# Patient Record
Sex: Male | Born: 1935 | Race: White | Hispanic: No | Smoking: Former smoker
Health system: Southern US, Community
[De-identification: ages and names within clinical notes are randomized; demographics above are authoritative.]

## PROBLEM LIST (undated history)

## (undated) DIAGNOSIS — I519 Heart disease, unspecified: Secondary | ICD-10-CM

## (undated) DIAGNOSIS — I4819 Other persistent atrial fibrillation: Secondary | ICD-10-CM

## (undated) DIAGNOSIS — E119 Type 2 diabetes mellitus without complications: Secondary | ICD-10-CM

## (undated) DIAGNOSIS — E785 Hyperlipidemia, unspecified: Secondary | ICD-10-CM

## (undated) DIAGNOSIS — I10 Essential (primary) hypertension: Secondary | ICD-10-CM

## (undated) HISTORY — PX: RECTAL SURGERY: SHX760

## (undated) HISTORY — DX: Hyperlipidemia, unspecified: E78.5

## (undated) HISTORY — DX: Essential (primary) hypertension: I10

## (undated) HISTORY — PX: HERNIA REPAIR: SHX51

## (undated) HISTORY — PX: OTHER SURGICAL HISTORY: SHX169

## (undated) HISTORY — PX: KNEE SURGERY: SHX244

## (undated) HISTORY — DX: Type 2 diabetes mellitus without complications: E11.9

---

## 1998-12-11 ENCOUNTER — Encounter: Admission: RE | Admit: 1998-12-11 | Discharge: 1999-03-11 | Payer: Self-pay | Admitting: Family Medicine

## 2000-09-23 ENCOUNTER — Encounter: Payer: Self-pay | Admitting: Urology

## 2000-09-25 ENCOUNTER — Ambulatory Visit (HOSPITAL_COMMUNITY): Admission: RE | Admit: 2000-09-25 | Discharge: 2000-09-25 | Payer: Self-pay | Admitting: Surgery

## 2002-01-06 ENCOUNTER — Ambulatory Visit (HOSPITAL_COMMUNITY): Admission: RE | Admit: 2002-01-06 | Discharge: 2002-01-06 | Payer: Self-pay | Admitting: Surgery

## 2002-01-06 ENCOUNTER — Encounter: Payer: Self-pay | Admitting: Surgery

## 2004-02-02 ENCOUNTER — Encounter (INDEPENDENT_AMBULATORY_CARE_PROVIDER_SITE_OTHER): Payer: Self-pay | Admitting: *Deleted

## 2004-02-02 ENCOUNTER — Ambulatory Visit (HOSPITAL_COMMUNITY): Admission: RE | Admit: 2004-02-02 | Discharge: 2004-02-02 | Payer: Self-pay | Admitting: Gastroenterology

## 2005-02-21 ENCOUNTER — Ambulatory Visit: Payer: Self-pay | Admitting: Internal Medicine

## 2005-02-22 ENCOUNTER — Ambulatory Visit: Payer: Self-pay | Admitting: Internal Medicine

## 2005-08-08 ENCOUNTER — Ambulatory Visit: Payer: Self-pay | Admitting: Internal Medicine

## 2005-11-29 ENCOUNTER — Ambulatory Visit: Payer: Self-pay | Admitting: Internal Medicine

## 2006-02-10 ENCOUNTER — Ambulatory Visit: Payer: Self-pay | Admitting: Internal Medicine

## 2006-05-01 ENCOUNTER — Ambulatory Visit: Payer: Self-pay | Admitting: Internal Medicine

## 2006-09-01 ENCOUNTER — Ambulatory Visit: Payer: Self-pay | Admitting: Internal Medicine

## 2007-02-02 ENCOUNTER — Ambulatory Visit: Payer: Self-pay | Admitting: Internal Medicine

## 2007-02-09 ENCOUNTER — Ambulatory Visit: Payer: Self-pay | Admitting: Internal Medicine

## 2007-03-12 ENCOUNTER — Ambulatory Visit: Payer: Self-pay | Admitting: Internal Medicine

## 2007-06-10 ENCOUNTER — Ambulatory Visit: Payer: Self-pay | Admitting: Internal Medicine

## 2007-09-10 ENCOUNTER — Telehealth: Payer: Self-pay | Admitting: Internal Medicine

## 2007-09-29 ENCOUNTER — Ambulatory Visit: Payer: Self-pay | Admitting: Internal Medicine

## 2007-10-29 ENCOUNTER — Telehealth (INDEPENDENT_AMBULATORY_CARE_PROVIDER_SITE_OTHER): Payer: Self-pay | Admitting: *Deleted

## 2007-11-03 DIAGNOSIS — E119 Type 2 diabetes mellitus without complications: Secondary | ICD-10-CM | POA: Insufficient documentation

## 2007-11-03 DIAGNOSIS — J302 Other seasonal allergic rhinitis: Secondary | ICD-10-CM

## 2007-11-03 DIAGNOSIS — I10 Essential (primary) hypertension: Secondary | ICD-10-CM | POA: Insufficient documentation

## 2007-11-03 DIAGNOSIS — J4520 Mild intermittent asthma, uncomplicated: Secondary | ICD-10-CM | POA: Insufficient documentation

## 2007-11-03 DIAGNOSIS — J3089 Other allergic rhinitis: Secondary | ICD-10-CM

## 2007-11-03 DIAGNOSIS — E7800 Pure hypercholesterolemia, unspecified: Secondary | ICD-10-CM | POA: Insufficient documentation

## 2007-12-03 ENCOUNTER — Ambulatory Visit: Payer: Self-pay | Admitting: Internal Medicine

## 2008-01-05 ENCOUNTER — Telehealth (INDEPENDENT_AMBULATORY_CARE_PROVIDER_SITE_OTHER): Payer: Self-pay | Admitting: *Deleted

## 2008-02-03 ENCOUNTER — Telehealth (INDEPENDENT_AMBULATORY_CARE_PROVIDER_SITE_OTHER): Payer: Self-pay | Admitting: *Deleted

## 2008-02-09 ENCOUNTER — Ambulatory Visit: Payer: Self-pay | Admitting: Internal Medicine

## 2008-02-12 ENCOUNTER — Encounter: Payer: Self-pay | Admitting: Internal Medicine

## 2008-03-07 ENCOUNTER — Encounter: Payer: Self-pay | Admitting: Internal Medicine

## 2008-03-15 ENCOUNTER — Telehealth (INDEPENDENT_AMBULATORY_CARE_PROVIDER_SITE_OTHER): Payer: Self-pay | Admitting: *Deleted

## 2008-07-08 ENCOUNTER — Ambulatory Visit: Payer: Self-pay | Admitting: Internal Medicine

## 2008-11-03 ENCOUNTER — Ambulatory Visit: Payer: Self-pay | Admitting: Internal Medicine

## 2009-02-16 ENCOUNTER — Ambulatory Visit: Payer: Self-pay | Admitting: Internal Medicine

## 2009-02-16 DIAGNOSIS — E785 Hyperlipidemia, unspecified: Secondary | ICD-10-CM | POA: Insufficient documentation

## 2009-02-21 ENCOUNTER — Ambulatory Visit: Payer: Self-pay | Admitting: Internal Medicine

## 2009-05-30 ENCOUNTER — Encounter: Admission: RE | Admit: 2009-05-30 | Discharge: 2009-05-30 | Payer: Self-pay | Admitting: Family Medicine

## 2009-06-20 ENCOUNTER — Ambulatory Visit: Payer: Self-pay | Admitting: Internal Medicine

## 2009-10-24 ENCOUNTER — Telehealth: Payer: Self-pay | Admitting: Internal Medicine

## 2009-10-26 ENCOUNTER — Ambulatory Visit: Payer: Self-pay | Admitting: Internal Medicine

## 2010-02-16 ENCOUNTER — Ambulatory Visit: Payer: Self-pay | Admitting: Internal Medicine

## 2010-02-19 ENCOUNTER — Ambulatory Visit: Payer: Self-pay | Admitting: Internal Medicine

## 2010-03-13 ENCOUNTER — Telehealth: Payer: Self-pay | Admitting: Internal Medicine

## 2010-06-26 ENCOUNTER — Ambulatory Visit: Payer: Self-pay | Admitting: Internal Medicine

## 2010-08-22 ENCOUNTER — Telehealth (INDEPENDENT_AMBULATORY_CARE_PROVIDER_SITE_OTHER): Payer: Self-pay | Admitting: *Deleted

## 2010-10-16 ENCOUNTER — Inpatient Hospital Stay (HOSPITAL_COMMUNITY)
Admission: RE | Admit: 2010-10-16 | Discharge: 2010-10-18 | Payer: Self-pay | Source: Home / Self Care | Attending: Orthopedic Surgery | Admitting: Orthopedic Surgery

## 2010-10-22 LAB — CBC
HCT: 32.6 % — ABNORMAL LOW (ref 39.0–52.0)
HCT: 31.2 % — ABNORMAL LOW (ref 39.0–52.0)
Hemoglobin: 10.8 g/dL — ABNORMAL LOW (ref 13.0–17.0)
Hemoglobin: 10.4 g/dL — ABNORMAL LOW (ref 13.0–17.0)
MCH: 32.4 pg (ref 26.0–34.0)
MCH: 32.3 pg (ref 26.0–34.0)
MCHC: 33.1 g/dL (ref 30.0–36.0)
MCHC: 33.3 g/dL (ref 30.0–36.0)
MCV: 97.9 fL (ref 78.0–100.0)
MCV: 96.9 fL (ref 78.0–100.0)
Platelets: 134 10*3/uL — ABNORMAL LOW (ref 150–400)
Platelets: 128 10*3/uL — ABNORMAL LOW (ref 150–400)
RBC: 3.33 MIL/uL — ABNORMAL LOW (ref 4.22–5.81)
RBC: 3.22 MIL/uL — ABNORMAL LOW (ref 4.22–5.81)
RDW: 13.3 % (ref 11.5–15.5)
RDW: 13.1 % (ref 11.5–15.5)
WBC: 6.7 10*3/uL (ref 4.0–10.5)
WBC: 9 10*3/uL (ref 4.0–10.5)

## 2010-10-22 LAB — GLUCOSE, CAPILLARY
Glucose-Capillary: 124 mg/dL — ABNORMAL HIGH (ref 70–99)
Glucose-Capillary: 112 mg/dL — ABNORMAL HIGH (ref 70–99)
Glucose-Capillary: 153 mg/dL — ABNORMAL HIGH (ref 70–99)
Glucose-Capillary: 173 mg/dL — ABNORMAL HIGH (ref 70–99)

## 2010-10-22 LAB — BASIC METABOLIC PANEL
BUN: 14 mg/dL (ref 6–23)
BUN: 12 mg/dL (ref 6–23)
CO2: 30 mEq/L (ref 19–32)
CO2: 29 mEq/L (ref 19–32)
Calcium: 8.7 mg/dL (ref 8.4–10.5)
Calcium: 8.8 mg/dL (ref 8.4–10.5)
Chloride: 101 mEq/L (ref 96–112)
Chloride: 101 mEq/L (ref 96–112)
Creatinine, Ser: 1.17 mg/dL (ref 0.4–1.5)
Creatinine, Ser: 1.18 mg/dL (ref 0.4–1.5)
GFR calc Af Amer: >60 mL/min (ref >60)
GFR calc Af Amer: >60 mL/min (ref >60)
GFR calc non Af Amer: >60 mL/min (ref >60)
GFR calc non Af Amer: >60 mL/min (ref >60)
Glucose, Bld: 146 mg/dL — ABNORMAL HIGH (ref 70–99)
Glucose, Bld: 136 mg/dL — ABNORMAL HIGH (ref 70–99)
Potassium: 4.1 mEq/L (ref 3.5–5.1)
Potassium: 4.2 mEq/L (ref 3.5–5.1)
Sodium: 137 mEq/L (ref 135–145)
Sodium: 137 mEq/L (ref 135–145)

## 2010-10-22 LAB — TYPE AND SCREEN
ABO/RH(D): A POS
Antibody Screen: NEGATIVE

## 2010-10-22 LAB — ABO/RH: ABO/RH(D): A POS

## 2010-11-06 NOTE — Progress Notes (Signed)
Summary: [facility] Physcial Forms  [facility] Physcial Forms   Imported By: Sherian Rein 02/22/2010 14:37:59  _____________________________________________________________________  External Attachment:    Type:   Image     Comment:   External Document

## 2010-11-06 NOTE — Assessment & Plan Note (Signed)
Summary: 12 months/apc   Primary Provider/Referring Provider:  Jeannetta Nap  CC:  Yearly follow up visit-asthma and allergies..  History of Present Illness: 08/01/08- He returns for one year follow-up, needing me to fill out his [facility] physical form so he can continue getting his allergy vaccine. He gives his own at 1:10 with no problems, states that it seems to help and he doesn't want to make changes. We discussed vaccine safety nd goals. and renewed Epipen. He denies significant nasal congestion, wheeze or cough.  Mar 07, 2009 Allergic rhinitis.  Needs VA renewal approval to continue allergy vaccine. Denies acute problems or changes in past year . He wants to continue allergy vaccine and asks standby meds be refilled. We reviewed risk benefit considerations with allergy vaccine, and the VA documentation needs.  March 07, 2010- Allergic rhinitis He comes for annual re-evaluation of his allergy vaccine. He is sure it helps, and doesn't want to stop to prove it.. This has been a good year. He brings needed form for [facility]. Says general health is good except for old injury right knee which will eventually need surgery. Denies cough, wheeze, chest pain, palpitation. He uses occasional bursts of fexofenadine180. Uses rescue inhaler rarely now. Denies hives, throat swelling or dry cough attributable to Lisinopril. Dr Jeannetta Nap continues to follow for primary care.     Current Medications (verified): 1)  Lisinopril 10 Mg  Tabs (Lisinopril) .... Take 1 Tablet By Mouth Once A Day 2)  Lovastatin 40 Mg Tabs (Lovastatin) .... Take 1 By Mouth Once Daily 3)  Bayer Low Strength 81 Mg  Tbec (Aspirin) .... Take 1 Tablet By Mouth Once A Day 4)  Proair Hfa 108 (90 Base) Mcg/act  Aers (Albuterol Sulfate) .... Inhale 2 Puffs Every 4 To 6 Hours As Needed 5)  Allergy Vaccine  Go 1:10 6)  Fexofenadine Hcl 180 Mg Tabs (Fexofenadine Hcl) .... Take 1 Tablet By Mouth Once A Day 7)  Epipen 0.3 Mg/0.83ml (1:1000)  Devi (Epinephrine Hcl  (Anaphylaxis)) .... For Severe Allergic Reaction As Needed  Allergies (verified): No Known Drug Allergies  Past History:  Past Medical History: Last updated: March 07, 2009 Allergic Rhinitis Asthma Diabetes, Type 2- diet Hyperlipidemia Hypertension  Past Surgical History: Last updated: 2009-03-07 hernia repair x 2 rectal surgery bilateral knee surgeries shoulder bone spur/ tendon repair.  Family History: Last updated: 03-07-2009 Mother- died ? lung cancer was smoker Father- died old age  Social History: Last updated: March 07, 2010 Patient states former smoker.  widowed Worked for [employer]  Risk Factors: Smoking Status: quit (07-Mar-2009)  Social History: Patient states former smoker.  widowed Worked for [employer]  Review of Systems      See HPI  The patient denies shortness of breath with activity, shortness of breath at rest, productive cough, non-productive cough, coughing up blood, chest pain, irregular heartbeats, acid heartburn, indigestion, loss of appetite, weight change, abdominal pain, difficulty swallowing, sore throat, tooth/dental problems, headaches, nasal congestion/difficulty breathing through nose, and sneezing.    Vital Signs:  Patient profile:   75 year old male Height:      70 inches Weight:      193 pounds BMI:     27.79 O2 Sat:      96 % on Room air Pulse rate:   46 / minute BP sitting:   132 / 80  (right arm) Cuff size:   regular  Vitals Entered By: Reynaldo Minium CMA (03-07-2010 9:09 AM)  O2 Flow:  Room air  Physical Exam  Additional Exam:  General: A/Ox3; pleasant and cooperative, NAD, trim SKIN: no rash, lesions NODES: no lymphadenopathy HEENT: [place]/AT, EOM- WNL, Conjuctivae- clear, PERRLA, TM-WNL, Nose- clear, mucosa is pale but with little mucus or edema, Throat- clear and wnl, Mallampati II, dentures NECK: Supple w/ fair ROM, JVD- none, normal carotid impulses w/o bruits  CHEST: Clear to  P&A HEART: RRR, no m/g/r heard ABDOMEN: EAV:WUJW, nl pulses, no edema  NEURO: Grossly intact to observation      Impression & Recommendations:  Problem # 1:  ALLERGIC RHINITIS (ICD-477.9)  He wishes to continue allergy vaccine, not wanting to return to his original symptoms and not wanting to face a restart and rebuild if he should come off and fail to do well with heavier use of pharmaceuticals than he now requires. He tolerates vaccine well.  We discussed options and will refill his Epipen. His updated medication list for this problem includes:    Fexofenadine Hcl 180 Mg Tabs (Fexofenadine hcl) .Marland Kitchen... Take 1 tablet by mouth once a day  Problem # 2:  ASTHMA (ICD-493.90) Well controlled with appropriate use of his rescue inhaler very occasionally. He is careful about exposures.  Medications Added to Medication List This Visit: 1)  Lovastatin 40 Mg Tabs (Lovastatin) .... Take 1 by mouth once daily  Other Orders: Est. Patient Level III (11914)  Patient Instructions: 1)  Please schedule a follow-up appointment in 1 year. 2)  Refill scripts printed 3)  I will send VA form as requested 4)  Continue allergy vaccine- contact the allergy lab as needed. Prescriptions: EPIPEN 0.3 MG/0.3ML (1:1000)  DEVI (EPINEPHRINE HCL (ANAPHYLAXIS)) For severe allergic reaction as needed  #1 x prn   Entered and Authorized by:   Waymon Budge MD   Signed by:   Waymon Budge MD on 02/16/2010   Method used:   Print then Give to Patient   RxID:   [id number] FEXOFENADINE HCL 180 MG TABS (FEXOFENADINE HCL) Take 1 tablet by mouth once a day  #90 x 3   Entered and Authorized by:   Waymon Budge MD   Signed by:   Waymon Budge MD on 02/16/2010   Method used:   Print then Give to Patient   RxID:   [id number] PROAIR HFA 108 (90 BASE) MCG/ACT  AERS (ALBUTEROL SULFATE) inhale 2 puffs every 4 to 6 hours as needed  #1 x prn   Entered and Authorized by:   Waymon Budge MD   Signed by:    Waymon Budge MD on 02/16/2010   Method used:   Print then Give to Patient   RxID:   [id number]

## 2010-11-06 NOTE — Progress Notes (Signed)
  Phone Note Other Incoming   Request: Send information Summary of Call: Records request received from Department of [facility], request forwarded to [employer].

## 2010-11-06 NOTE — Progress Notes (Signed)
Summary: change from fexofenadine to claritin due to ins coverage  Phone Note Outgoing Call Call back at [facility] Phone [phone]   Call placed by: Philipp Deputy CMA,  March 13, 2010 4:24 PM Call placed to: Patient Action Taken: Phone Call Completed Summary of Call: called pt because [facility]pt [facility] veterans [facility] will not cover the fexofenadine that was sent, pt request that we send new rx for claritin because they will cover this--[facility] print [facility] [place] [facility] fax # [phone] Initial call taken by: Philipp Deputy CMA,  March 13, 2010 4:26 PM  Follow-up for Phone Call        done new rx signed by dr young and faxed to requested fax # above Follow-up by: Philipp Deputy CMA,  March 13, 2010 4:29 PM    New/Updated Medications: CLARITIN 10 MG TABS (LORATADINE) 1 by mouth once daily Prescriptions: CLARITIN 10 MG TABS (LORATADINE) 1 by mouth once daily  #90 x 3   Entered by:   Philipp Deputy CMA   Authorized by:   Waymon Budge MD   Signed by:   Philipp Deputy CMA on 03/13/2010   Method used:   Printed then faxed to ...       [facility]* (retail)       [address] [place], [place]  [address]       Ph: [phone] or [phone]       Fax: [phone]   RxID:   [id number]

## 2010-11-06 NOTE — Progress Notes (Signed)
Summary: prescript  Phone Note Call from Patient   Caller: Patient Call For: Rashaunda Rahl Summary of Call: need fexofenadine 180mg  [facility] Initial call taken by: Rickard Patience,  October 24, 2009 10:16 AM  Follow-up for Phone Call        Pt states he has changed insurance to [facility] and they will cover fexofenadine so he wants to switch back from claritin to fexofenadine because he states it works better for him. Please advise if okay to switch. Carron Curie CMA  October 24, 2009 10:43 AM   Additional Follow-up for Phone Call Additional follow up Details #1::        Ok to give. Reynaldo Minium CMA  October 24, 2009 11:20 AM   rx sent. Carron Curie CMA  October 24, 2009 12:03 PM     New/Updated Medications: FEXOFENADINE HCL 180 MG TABS (FEXOFENADINE HCL) Take 1 tablet by mouth once a day Prescriptions: FEXOFENADINE HCL 180 MG TABS (FEXOFENADINE HCL) Take 1 tablet by mouth once a day  #90 x 3   Entered by:   Carron Curie CMA   Authorized by:   Waymon Budge MD   Signed by:   Carron Curie CMA on 10/24/2009   Method used:   Faxed to ...       [facility] (mail-order)       [address]       [place], [place]  [id number]       Ph: [phone]       Fax: [phone]   RxID:   [id number]

## 2010-11-08 DIAGNOSIS — J301 Allergic rhinitis due to pollen: Secondary | ICD-10-CM

## 2010-12-17 LAB — BASIC METABOLIC PANEL
BUN: 14 mg/dL (ref 6–23)
CO2: 30 mEq/L (ref 19–32)
Calcium: 9.5 mg/dL (ref 8.4–10.5)
Chloride: 99 mEq/L (ref 96–112)
Creatinine, Ser: 1.02 mg/dL (ref 0.4–1.5)
GFR calc Af Amer: >60 mL/min (ref >60)
GFR calc non Af Amer: >60 mL/min (ref >60)
Glucose, Bld: 90 mg/dL (ref 70–99)
Potassium: 4.1 mEq/L (ref 3.5–5.1)
Sodium: 137 mEq/L (ref 135–145)

## 2010-12-17 LAB — URINALYSIS, ROUTINE W REFLEX MICROSCOPIC
Bilirubin Urine: NEGATIVE
Glucose, UA: NEGATIVE mg/dL
Hgb urine dipstick: NEGATIVE
Ketones, ur: NEGATIVE mg/dL
Nitrite: NEGATIVE
Protein, ur: NEGATIVE mg/dL
Specific Gravity, Urine: 1.01 (ref 1.005–1.030)
Urobilinogen, UA: 0.2 mg/dL (ref 0.0–1.0)
pH: 7 (ref 5.0–8.0)

## 2010-12-17 LAB — CBC
HCT: 42.6 % (ref 39.0–52.0)
Hemoglobin: 14.4 g/dL (ref 13.0–17.0)
MCH: 32.9 pg (ref 26.0–34.0)
MCHC: 33.8 g/dL (ref 30.0–36.0)
MCV: 97.3 fL (ref 78.0–100.0)
Platelets: 162 10*3/uL (ref 150–400)
RBC: 4.38 MIL/uL (ref 4.22–5.81)
RDW: 13.2 % (ref 11.5–15.5)
WBC: 6.5 10*3/uL (ref 4.0–10.5)

## 2010-12-17 LAB — DIFFERENTIAL
Basophils Absolute: 0 10*3/uL (ref 0.0–0.1)
Basophils Relative: 1 % (ref 0–1)
Eosinophils Absolute: 0.1 10*3/uL (ref 0.0–0.7)
Eosinophils Relative: 1 % (ref 0–5)
Lymphocytes Relative: 25 % (ref 12–46)
Lymphs Abs: 1.6 10*3/uL (ref 0.7–4.0)
Monocytes Absolute: 0.5 10*3/uL (ref 0.1–1.0)
Monocytes Relative: 7 % (ref 3–12)
Neutro Abs: 4.3 10*3/uL (ref 1.7–7.7)
Neutrophils Relative %: 67 % (ref 43–77)

## 2010-12-17 LAB — APTT: aPTT: 30 seconds (ref 24–37)

## 2010-12-17 LAB — SURGICAL PCR SCREEN
MRSA, PCR: NEGATIVE
Staphylococcus aureus: NEGATIVE

## 2010-12-17 LAB — PROTIME-INR
INR: 1.01 (ref 0.00–1.49)
Prothrombin Time: 13.5 seconds (ref 11.6–15.2)

## 2011-02-11 ENCOUNTER — Telehealth: Payer: Self-pay | Admitting: Internal Medicine

## 2011-02-13 NOTE — Telephone Encounter (Signed)
Katie, have you called this pt?

## 2011-02-14 NOTE — Telephone Encounter (Signed)
Please call and make sure that the patient remembers that we his appt for 02-15-11 was moved as CY is not in the office Friday morning. Thanks. I think Edward Mcdaniel spoke with him.

## 2011-02-14 NOTE — Telephone Encounter (Signed)
lmomtcb x1 

## 2011-02-15 ENCOUNTER — Ambulatory Visit: Payer: Self-pay | Admitting: Internal Medicine

## 2011-02-18 NOTE — Telephone Encounter (Signed)
Appt resche for 02/26/2011 at 11:15 am.

## 2011-02-20 ENCOUNTER — Ambulatory Visit (INDEPENDENT_AMBULATORY_CARE_PROVIDER_SITE_OTHER): Payer: Medicare HMO

## 2011-02-20 ENCOUNTER — Encounter: Payer: Self-pay | Admitting: Internal Medicine

## 2011-02-20 DIAGNOSIS — J309 Allergic rhinitis, unspecified: Secondary | ICD-10-CM

## 2011-02-22 NOTE — Op Note (Signed)
[employer]  Patient:    Edward Mcdaniel, Edward Mcdaniel                     MRN: [id number] Proc. Date: 09/25/00 Adm. Date:  [id number] Attending:  Andre Lefort CC:         Hadassah Pais. Jeannetta Nap, M.D.   Operative Report  DATE OF BIRTH:  [birth year]  CCS# [id number]  PREOPERATIVE DIAGNOSIS:  Right inguinal hernia.  POSTOPERATIVE DIAGNOSIS:  Medium-sized indirect right inguinal hernia.  OPERATION PERFORMED:  Laparoscopic repair of right inguinal hernia with mesh.  SURGEON:  Sandria Bales. Ezzard Standing, M.D.  ASSISTANT:  None.  ANESTHESIA:  General endotracheal.  ESTIMATED BLOOD LOSS:  Minimal.  INDICATIONS FOR PROCEDURE:  The patient is a 75 year old white male who is a patient of Dr. Windle Guard, who comes with a symptomatic hernia.  He now comes for repair of this right inguinal hernia.  DESCRIPTION OF PROCEDURE:  The patient was placed in a supine position with his arms tucked to his side and a general anesthesia and Foley in place.  1 gm of Ancef at the initiation of the procedure.  His lower abdomen was shaved.  An infraumbilical incision was made with sharp dissection carried down to the anterior rectus abdominis.  I went to the right side of midline, made an incision through the anterior rectus abdominis fascia, retracted the rectus abdominis muscle anteriorly and then passed a PDB balloon in the preperitoneal space down to the pubic bone.  I then insufflated this balloon under direct laparoscopic visualization.  After I got a reasonably good dissection, particularly on the right side, then the left side of the balloon never deployed, I removed the dissecting balloon and inserted a 10 mm balloon Hasson trocar.  A 0 degree laparoscope was then inserted into the preperitoneal space.  Two 5 mm trocars were then placed into the preperitoneal space.  I dissected along identifying the pubic midline at the pubic symphysis.  I went down Coopers ligament to  the right side, encircled the cord structures in the right inguinal area.  He had a medium-sized indirect inguinal hernia which I reduced to the level of the anterior iliac spine.  He also had a medium to large lipoma of the cord which I also reduced leaving a fairly patulous internal ring.  I used the precut Atrium mesh and inserted it into the abdominal cavity.  The mesh was stapled medially to the pubic bone, inferiorly to Coopers ligament, superiorly to transversalis fascia.  Mesh was placed around the cord structures and tacked back down to Coopers ligament.  I avoided taples lateral to the cord and inferior to the iliopubic tract.  There was no bleeding at the end of the procedure.  I removed the trocars, closed the fascial defect with a 0 Vicryl suture, the skin at each site was closed with a 4-0 Monocryl suture.  The skin was painted with tincture of benzoin, steri-stripped and sterilely dressed.  The patient tolerated the procedure well and was transported to the recovery room in good condition.  Sponge, needle and instrument counts were correct at the end of the case.  He can go home today. DD:  09/25/00 TD:  09/26/00 Job: [id number]

## 2011-02-22 NOTE — Op Note (Signed)
NAME:  Edward Mcdaniel, Edward Mcdaniel NO.:  [id number]   MEDICAL RECORD NO.:  [id number]                   PATIENT TYPE:  AMB   LOCATION:  ENDO                                 [facility]ACILITY[facility]  [facility]   PHYSICIAN:  Danise Edge, M.D.                DATE OF BIRTH:  10/06/1936   DATE OF PROCEDURE:  02/02/2004  DATE OF DISCHARGE:                                 OPERATIVE REPORT   REFERRING PHYSICIAN:  Dr. Windle Guard, M.D.   PROCEDURE:  Colonoscopy.   PROCEDURE INDICATION:  [name] is a 75 year old male scheduled  to undergo his first screening colonoscopy with polypectomy to prevent colon  cancer.   ENDOSCOPIST:  Danise Edge, M.D.   PREMEDICATION:  Versed 5 mg, Demerol 50 mg.   DESCRIPTION OF PROCEDURE:  After obtaining informed consent, Mr. Spray  was placed in the left lateral decubitus position.  I administered  intravenous Demerol and intravenous Versed to achieve conscious sedation for  the procedure.  The patient's blood pressure, oxygen saturation, and cardiac  rhythm were monitored throughout the procedure and documented in the medical  record.   Anal inspection and digital rectal exam were normal.  The Olympus adjustable  pediatric video colonoscope was introduced into the rectum and advanced to  the cecum.  Colonic preparation for the exam today was excellent.   Rectum:  From the distal rectum, a 2-mm sessile polyp was removed with the  electrocautery snare and a 1-mm sessile polyp was removed with the cold  snare.   Sigmoid colon and descending colon:  Left colonic diverticulosis.   Splenic flexure:  Normal.   Transverse colon:  Normal.   Hepatic flexure:  Normal.   Ascending colon:  Normal.   Cecum and ileocecal valve:  Normal.   ASSESSMENT:  1. Two small polyps were removed from the distal rectum and submitted for     pathological interpretation.  2. Left colonic diverticulosis.   RECOMMENDATION:  Repeat colonoscopy in  5 years if rectal polyp returns  neoplastic pathologically.                                               Danise Edge, M.D.    MJ/MEDQ  D:  02/02/2004  T:  02/02/2004  Job:  [id number]   cc:   Windle Guard, M.D.  [address]  [place], [place] [address]  Fax: [phone]

## 2011-02-22 NOTE — Op Note (Signed)
[place]  Patient:    Edward Mcdaniel, Edward Mcdaniel Visit Number: [id number] MRN: [id number]          Service Type: DSU Location: DAY Attending Physician:  Andre Lefort Dictated by:   Sandria Bales. Ezzard Standing, M.D. Proc. Date: 01/06/02 Admit Date:  01/06/2002 Discharge Date: 01/06/2002   CC:         Buren Kos, M.D.   Operative Report  DATE OF BIRTH:  09/29/1936  CCS NUMBER:  [id number]  PREOPERATIVE DIAGNOSIS:  Left inguinal hernia.  POSTOPERATIVE DIAGNOSES:  Medium-sized indirect left inguinal hernia and small direct inguinal hernia.  SURGEON:  Sandria Bales. Ezzard Standing, M.D.  ASSISTANT:  None.  PROCEDURE:  Laparoscopic left inguinal hernia repair with precut Atrium mesh.  ANESTHESIA:  General endotracheal.  ESTIMATED BLOOD LOSS:  Minimal.  INDICATION FOR PROCEDURE:  Edward Mcdaniel is a 75 year old male, who I had done a prior laparoscopic right inguinal hernia repair in December 2001. Approximately six months ago, he noticed a bulge in the left groin and has now presented with a symptomatic left inguinal hernia.  I discussed with him about repairing this hernia.  He would like to have it done laparoscopically, I think we can try, though, I told him there was probably at least a 30-40% chance we would have to do the hernia open because of the prior dissection laparoscopically.  OPERATIVE NOTE:  The patient was given 1 g of Ancef at the initiation of the procedure and had a Foley catheter in place, and was under general anesthesia. The patient was placed in a supine position.  His lower abdomen was shaved, prepped with Betadine solution, and sterilely draped.  An infraumbilical incision was made with sharp dissection through the left rectus abdominis fascia, retracting the rectus abdominis muscle anteriorly and placing the PBD balloon in the preperitoneal space and insufflating this.  I got a good distention on the left side, where most of  the tissues were virgin, but it got no distention in the right side where he had the prior inguinal hernia repair.  I was able to identify a very small, maybe 5 or 6 mm direct inguinal hernia, which is not what I though I was feeling preoperatively, but he had a moderate-sized indirect left inguinal hernia.  I reduced the inguinal cords, inguinal sac into the preperitoneal space.  I encircled the cord structures and identified the vas and reduced also a lipoma of the cord.  I then used the precut Atrium mesh placed in the preperitoneal space and used the ______ from [employer] to staple this medially to the pubic tubercle, inferiorly to the Coopers ligament, superiorly to the transversalis fascia. Again, there was a key hole cut for the internal ring.  This was wrapped around the cord structures and stapled back to Coopers ligament and then, I went laterally and superior and placed staples, but avoided any staples below the iliopubic track laterally.  There was no bleeding.  I then removed the trocars under direct visualization. There was no bleeding from the trocar sites.  The umbilical port site was removed and closed with 0 Vicryl suture.  The skin at each site was closed with a 5-0 Vicryl suture and painted with tincture of benzoin, steri-stripped and sterilely dressed.  The patient tolerated the procedure well and was transported to the recovery room in good condition.  Sponge and needle count were correct at the end of the case. Dictated by:   Sandria Bales.  Ezzard Standing, M.D. Attending Physician:  Andre Lefort DD:  01/06/02 TD:  01/06/02 Job: [id number]

## 2011-02-22 NOTE — Assessment & Plan Note (Signed)
[facility]                             PULMONARY OFFICE NOTE   NAME:Edward Mcdaniel, Edward Mcdaniel                     MRN:          [id number]  DATE:02/09/2007                            DOB:          10-15-1935    PROBLEM:  1. Allergic rhinitis.  2. Asthma.   HISTORY:  One-year followup.  He got quite short of breath on a trolley  car, riding up [place], and needed several days to get over it with  chest tightness and some cough, wheeze and breathlessness.  There was no  pain and no sputum.  After he got home, he had to use his inhaler for  the first time in a couple of years to completely clear the episode.  I  cannot tell from his description if he might have caught an infection or  even had acute mountain sickness, although I would not have thought that  would last this long.  He now feels well.  He has continued allergy  vaccine at 1:10 with no concerns.   MEDICATIONS:  1. Lisinopril 10 mg.  2. Lovastatin 24 mg.  3. Aspirin 81 mg.  4. Allergy vaccine.  5. Occasional fexofenadine.  6. Albuterol HFA rescue inhaler.   ALLERGIES:  No medication allergy.   OBJECTIVE:  Weight 188 pounds.  BP 148/74, pulse regular at 54, room air  saturation of 97%.  He is a trim, tan, fit-looking man.  LUNGS:  Very clear.  Nose and throat are clear.  HEART:  Sounds regular and normal.   IMPRESSION:  1. Allergic rhinitis, controlled.  2. Mild asthma with some kind of an acute persistent cough and dyspnea      event that developed while he was at altitude in dry air.   PLAN:  Continue vaccine.  Schedule return 1-year followup, earlier  p.r.n.  Use the albuterol p.r.n. as discussed.  Schedule pulmonary  function tests and call for report.     Clinton D. Maple Hudson, MD, Tonny Bollman, FACP  Electronically Signed    CDY/MedQ  DD: 02/09/2007  DT: 02/10/2007  Job #: [id number]   cc:   Windle Guard, M.D.

## 2011-02-26 ENCOUNTER — Encounter: Payer: Self-pay | Admitting: Internal Medicine

## 2011-02-26 ENCOUNTER — Ambulatory Visit (INDEPENDENT_AMBULATORY_CARE_PROVIDER_SITE_OTHER): Payer: Medicare HMO | Admitting: Internal Medicine

## 2011-02-26 VITALS — BP 124/86 | HR 57 | Ht 70.0 in | Wt 191.4 lb

## 2011-02-26 DIAGNOSIS — J45909 Unspecified asthma, uncomplicated: Secondary | ICD-10-CM

## 2011-02-26 DIAGNOSIS — J309 Allergic rhinitis, unspecified: Secondary | ICD-10-CM

## 2011-02-26 NOTE — Patient Instructions (Signed)
Ok to increase the interval between allergy shots to every two weeks- it will help to mark a kitchen calendar to keep track.

## 2011-02-26 NOTE — Progress Notes (Signed)
  Subjective:    Patient ID: Edward Mcdaniel, male    DOB: 03-Jun-1936, 75 y.o.   MRN: [id number]  HPI 02/26/11- 15 yoM former smoker followed for allergic rhinitis, asthma, complicated by DM and HBP Last here Feb 16, 2010. He mentions BP control issue so we discussed side effects of ACE inhibitors to watch for with no problems seen. He rarely uses Proair except if in [place] visiting. Altitude will take his breath. Fexofenadine not used in a month. He continues allergy vaccine with no problems. We discussed assessment of vaccine benefit and will increase the interval.  [facility] continues to need documentation.   Review of Systems Constitutional:   No weight loss, night sweats,  Fevers, chills, fatigue, lassitude. HEENT:   No headaches,  Difficulty swallowing,  Tooth/dental problems,  Sore throat,                  CV:  No chest pain,  Orthopnea, PND, swelling in lower extremities, anasarca, dizziness, palpitations  GI  No heartburn, indigestion, abdominal pain, nausea, vomiting, diarrhea, change in bowel habits, loss of appetite  Resp: No shortness of breath with exertion or at rest except at altitude.  No excess mucus, no productive cough,  No non-productive cough,  No coughing up of blood.  No change in color of mucus.  No wheezing.  Skin: no rash or lesions.  GU: no dysuria, change in color of urine, no urgency or frequency.  No flank pain.  MS:  No joint pain or swelling.  No decreased range of motion.  No back pain.  Psych:  No change in mood or affect. No depression or anxiety.  No memory loss.      Objective:   Physical Exam General- Alert, Oriented, Affect-appropriate, Distress- none acute  Skin- rash-none, lesions- none, excoriation- none  Lymphadenopathy- none  Head- atraumatic  Eyes- Gross vision intact, PERRLA, conjunctivae clear secretions  Ears- Hearing, canals, Tm- normal  Nose- Clear, No-  Septal dev, mucus, polyps, erosion, perforation   Throat- Mallampati II ,  mucosa clear , drainage- none, tonsils- atrophic  Neck- flexible , trachea midline, no stridor , thyroid nl, carotid no bruit  Chest - symmetrical excursion , unlabored     Heart/CV- RRR , no murmur , no gallop  , no rub, nl s1 s2                     - JVD- none , edema- none, stasis changes- none, varices- none     Lung- clear to P&A, wheeze- none, cough- none , dullness-none, rub- none     Chest wall-   Abd- tender-no, distended-no, bowel sounds-present, HSM- no  Br/ Gen/ Rectal- Not done, not indicated  Extrem- cyanosis- none, clubbing, none, atrophy- none, strength- nl. Incision scar right knee  Neuro- grossly intact to observation         Assessment & Plan:

## 2011-02-26 NOTE — Assessment & Plan Note (Signed)
Good control. He did need to increase antihistamine during the heavy spring pollen season. We are going to let him try increasing the interval between allergy shots to 2 weeks.

## 2011-03-04 NOTE — Assessment & Plan Note (Signed)
Good control with rare need for rescue inhaler now.

## 2011-09-17 ENCOUNTER — Ambulatory Visit (INDEPENDENT_AMBULATORY_CARE_PROVIDER_SITE_OTHER): Payer: Medicare HMO

## 2011-09-17 DIAGNOSIS — J309 Allergic rhinitis, unspecified: Secondary | ICD-10-CM

## 2011-10-02 DIAGNOSIS — C4492 Squamous cell carcinoma of skin, unspecified: Secondary | ICD-10-CM

## 2011-10-02 HISTORY — DX: Squamous cell carcinoma of skin, unspecified: C44.92

## 2012-02-26 ENCOUNTER — Ambulatory Visit (INDEPENDENT_AMBULATORY_CARE_PROVIDER_SITE_OTHER): Payer: Medicare HMO | Admitting: Internal Medicine

## 2012-02-26 ENCOUNTER — Encounter: Payer: Self-pay | Admitting: Internal Medicine

## 2012-02-26 VITALS — BP 118/64 | HR 50 | Ht 70.0 in | Wt 192.8 lb

## 2012-02-26 DIAGNOSIS — J45909 Unspecified asthma, uncomplicated: Secondary | ICD-10-CM

## 2012-02-26 DIAGNOSIS — J3089 Other allergic rhinitis: Secondary | ICD-10-CM

## 2012-02-26 DIAGNOSIS — J309 Allergic rhinitis, unspecified: Secondary | ICD-10-CM

## 2012-02-26 DIAGNOSIS — J45998 Other asthma: Secondary | ICD-10-CM

## 2012-02-26 MED ORDER — EPINEPHRINE 0.3 MG/0.3ML IJ DEVI: 0.3000 mg | Freq: Once | INTRAMUSCULAR | Status: AC | PRN

## 2012-02-26 MED ORDER — ALBUTEROL SULFATE HFA 108 (90 BASE) MCG/ACT IN AERS: 2.0000 | INHALATION_SPRAY | Freq: Four times a day (QID) | RESPIRATORY_TRACT | Status: DC | PRN

## 2012-02-26 MED ORDER — LORATADINE 10 MG PO TABS: 10.0000 mg | ORAL_TABLET | Freq: Every day | ORAL | Status: DC

## 2012-02-26 NOTE — Patient Instructions (Signed)
Continue allergy vaccine   Scripts sent for your rescue inhaler, epipen and loratadine  Please call as needed

## 2012-02-26 NOTE — Progress Notes (Signed)
Subjective:    Patient ID: CATHY ROPP, male    DOB: 1935/12/31, 76 y.o.   MRN: [id number]  HPI 02/26/11- 73 yoM former smoker followed for allergic rhinitis, asthma, complicated by DM and HBP Last here Feb 16, 2010. He mentions BP control issue so we discussed side effects of ACE inhibitors to watch for with no problems seen. He rarely uses Proair except if in [place] visiting. Altitude will take his breath. Fexofenadine not used in a month. He continues allergy vaccine with no problems. We discussed assessment of vaccine benefit and will increase the interval.  [facility].   02/26/12- 74 yoM former smoker followed for allergic rhinitis, asthma, complicated by DM and HBP Still on vaccine and denies any troubles with pollen or other allergens at this time. He credits to the vaccine for making the difference. Reports good control of allergic rhinitis and asthma. Getting allergy shots every 2 weeks. Occasionally decides to take an extra dose. Likes loratadine. He expects [place] to have a form from the [place] system to send records supporting ongoing need for his care here including allergy vaccine. His Has not had any wheezing recently. Only occasionally uses rescue inhaler. Denies hives cough with lisinopril. 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  ROS-see HPI Constitutional:   No-   weight loss, night sweats, fevers, chills, fatigue, lassitude. HEENT:   No-  headaches, difficulty swallowing, tooth/dental problems, sore throat,       No-  sneezing, itching, ear ache, nasal congestion, post nasal drip,  CV:  No-   chest pain, orthopnea, PND, swelling in lower extremities, anasarca, dizziness, palpitations Resp: No-   shortness of breath with exertion or at rest.              No-   productive cough,  No non-productive cough,  No- coughing up of blood.              No-   change in color of mucus.  No- wheezing.   Skin: No-   rash or  lesions. GI:  No-   heartburn, indigestion, abdominal pain, nausea, vomiting GU: . MS:  No-   joint pain or swelling except residual discomfort right knee.Marland Kitchen   Neuro-     nothing unusual Psych:  No- change in mood or affect. No depression or anxiety.  No memory loss.  OBJ- Physical Exam General- Alert, Oriented, Affect-appropriate, Distress- none acute Skin- rash-none, lesions- none, excoriation- none Lymphadenopathy- none Head- atraumatic            Eyes- Gross vision intact, PERRLA, conjunctivae and secretions clear            Ears- Hearing, canals-normal            Nose- Clear, no-Septal dev, mucus, polyps, erosion, perforation             Throat- Mallampati II , mucosa clear , drainage- none, tonsils- atrophic Neck- flexible , trachea midline, no stridor , thyroid nl, carotid no bruit Chest - symmetrical excursion , unlabored           Heart/CV- RRR , no murmur , no gallop  , no rub, nl s1 s2                           - JVD- none , edema- none, stasis changes- none, varices- none  Lung- clear to P&A, wheeze- none, cough- none , dullness-none, rub- none           Chest wall-  Abd-  Br/ Gen/ Rectal- Not done, not indicated Extrem- cyanosis- none, clubbing, none, atrophy- none, strength- nl. Elastic band on right knee Neuro- grossly intact to observation

## 2012-03-02 NOTE — Assessment & Plan Note (Signed)
Occasional use of rescue inhaler but generally good control.

## 2012-03-02 NOTE — Assessment & Plan Note (Signed)
We discussed duration of allergy vaccine therapy. He strongly believes it is controlling an ongoing problem and chooses to continue. Good control.

## 2012-04-01 ENCOUNTER — Ambulatory Visit (INDEPENDENT_AMBULATORY_CARE_PROVIDER_SITE_OTHER): Payer: Medicare HMO

## 2012-04-01 DIAGNOSIS — J309 Allergic rhinitis, unspecified: Secondary | ICD-10-CM

## 2012-05-01 ENCOUNTER — Encounter: Payer: Self-pay | Admitting: Internal Medicine

## 2012-05-01 ENCOUNTER — Ambulatory Visit (INDEPENDENT_AMBULATORY_CARE_PROVIDER_SITE_OTHER)
Admission: RE | Admit: 2012-05-01 | Discharge: 2012-05-01 | Disposition: A | Discharge status: Home / Self Care | Payer: Medicare HMO | Source: Ambulatory Visit | Attending: Internal Medicine | Admitting: Internal Medicine

## 2012-05-01 ENCOUNTER — Ambulatory Visit (INDEPENDENT_AMBULATORY_CARE_PROVIDER_SITE_OTHER): Payer: Medicare HMO | Admitting: Internal Medicine

## 2012-05-01 VITALS — BP 140/80 | HR 52 | Ht 71.0 in | Wt 196.8 lb

## 2012-05-01 DIAGNOSIS — R0600 Dyspnea, unspecified: Secondary | ICD-10-CM

## 2012-05-01 DIAGNOSIS — J45909 Unspecified asthma, uncomplicated: Secondary | ICD-10-CM

## 2012-05-01 DIAGNOSIS — R0989 Other specified symptoms and signs involving the circulatory and respiratory systems: Secondary | ICD-10-CM

## 2012-05-01 DIAGNOSIS — R0609 Other forms of dyspnea: Secondary | ICD-10-CM

## 2012-05-01 DIAGNOSIS — J45998 Other asthma: Secondary | ICD-10-CM

## 2012-05-01 MED ORDER — FLUTICASONE-SALMETEROL 250-50 MCG/DOSE IN AEPB: INHALATION_SPRAY | RESPIRATORY_TRACT | Status: DC

## 2012-05-01 NOTE — Progress Notes (Signed)
Subjective:    Patient ID: Edward Mcdaniel, male    DOB: 08-29-1936, 76 y.o.   MRN: [id number]  HPI 02/26/11- 89 yoM former smoker followed for allergic rhinitis, asthma, complicated by DM and HBP Last here Feb 16, 2010. He mentions BP control issue so we discussed side effects of ACE inhibitors to watch for with no problems seen. He rarely uses Proair except if in [place] visiting. Altitude will take his breath. Fexofenadine not used in a month. He continues allergy vaccine with no problems. We discussed assessment of vaccine benefit and will increase the interval.  [facility] continues [facility].   02/26/12- 74 yoM former smoker followed for allergic rhinitis, asthma, complicated by DM and HBP Still on vaccine and denies any troubles with pollen or other allergens at this time. He credits to the vaccine for making the difference. Reports good control of allergic rhinitis and asthma. Getting allergy shots every 2 weeks. Occasionally decides to take an extra dose. Likes loratadine. He expects [place] to have a form from the [place] system to send records supporting ongoing need for his care here including allergy vaccine. His Has not had any wheezing recently. Only occasionally uses rescue inhaler. Denies hives cough with lisinopril. 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  05/01/12- 74 yoM former smoker followed for allergic rhinitis, asthma, complicated by DM and HBP Increased SOB at rest and with exertion and chest tightness - symtoms are off and on x 2 months.  Some coughing.  No wheezing. He continues his allergy vaccine and is followed through the [place] other medical care. Now here for an acute visit, noting increased shortness of breath especially in the last 3 or 4 weeks. Some nonproductive cough and chest tightness without chest pain or wheeze. Only modest benefit from his rescue inhaler. He has no heart history, denies swelling or leg pain except residual  from his knee replacement, without calf pain.Marland Kitchen  ROS-see HPI Constitutional:   No-   weight loss, night sweats, fevers, chills, fatigue, lassitude. HEENT:   No-  headaches, difficulty swallowing, tooth/dental problems, sore throat,       No-  sneezing, itching, ear ache, nasal congestion, post nasal drip,  CV:  No-   chest pain, orthopnea, PND, swelling in lower extremities, anasarca, dizziness, palpitations Resp: +  shortness of breath with exertion or at rest.              No-   productive cough,  No non-productive cough,  No- coughing up of blood.              No-   change in color of mucus.  No- wheezing.   Skin: No-   rash or lesions. GI:  No-   heartburn, indigestion, abdominal pain, nausea, vomiting GU: . MS:  No-   joint pain or swelling except residual discomfort right knee.Marland Kitchen   Neuro-     nothing unusual Psych:  No- change in mood or affect. No depression or anxiety.  No memory loss.  OBJ- Physical Exam General- Alert, Oriented, Affect-appropriate, Distress- none acute, looks fit Skin- rash-none, lesions- none, excoriation- none Lymphadenopathy- none Head- atraumatic            Eyes- Gross vision intact, PERRLA, conjunctivae and secretions clear            Ears- Hearing, canals-normal            Nose- Clear, no-Septal dev, mucus, polyps, erosion, perforation  Throat- Mallampati II , mucosa clear , drainage- none, tonsils- atrophic Neck- flexible , trachea midline, no stridor , thyroid nl, carotid no bruit Chest - symmetrical excursion , unlabored           Heart/CV- RRR , no murmur , no gallop  , no rub, nl s1 s2                           - JVD- none , edema- none, stasis changes- none, varices- none           Lung- clear to P&A, wheeze- none, cough- none , dullness-none, rub- none           Chest wall-  Abd-  Br/ Gen/ Rectal- Not done, not indicated Extrem- cyanosis- none, clubbing, none, atrophy- none, strength- nl. Neg Homan's Neuro- grossly intact to  observation

## 2012-05-01 NOTE — Patient Instructions (Addendum)
Order- CXr  Dx dyspnea  Sample and script Advair 250 inhaler      1 puff then rinse mouth, twice daily

## 2012-05-06 ENCOUNTER — Telehealth: Payer: Self-pay | Admitting: Internal Medicine

## 2012-05-06 NOTE — Assessment & Plan Note (Addendum)
Exam is not impressive. Oxygen saturation 97% and lungs are clear. I don't find obvious evidence of cardiac disease or DVT. Plan-chest x-ray and trial of Advair

## 2012-05-06 NOTE — Telephone Encounter (Signed)
Result Notes     Notes Recorded by Ronny Bacon, CMA on 05/06/2012 at 1:20 PM LMTCB with grandson ------  Notes Recorded by Waymon Budge, MD on 05/01/2012 at 8:18 PM CXR- lungs clear, with no active disease. There is atherosclerosis in arteries  ------------------- Called spoke with patient, advised of cxr results as stated by CY.  Pt verbalized his understanding and stated that his breathing has improved since last ov.  Nothing further needed, will sign off.

## 2012-05-06 NOTE — Progress Notes (Signed)
Quick Note:  LMTCB with grandson ______

## 2012-07-02 ENCOUNTER — Other Ambulatory Visit: Payer: Self-pay | Admitting: Surgery

## 2012-08-19 ENCOUNTER — Ambulatory Visit (INDEPENDENT_AMBULATORY_CARE_PROVIDER_SITE_OTHER): Payer: Medicare HMO

## 2012-08-19 DIAGNOSIS — J309 Allergic rhinitis, unspecified: Secondary | ICD-10-CM

## 2013-02-10 ENCOUNTER — Ambulatory Visit (INDEPENDENT_AMBULATORY_CARE_PROVIDER_SITE_OTHER): Payer: Medicare HMO

## 2013-02-10 DIAGNOSIS — J309 Allergic rhinitis, unspecified: Secondary | ICD-10-CM

## 2013-02-15 ENCOUNTER — Telehealth: Payer: Self-pay | Admitting: Internal Medicine

## 2013-02-15 MED ORDER — "SYRINGE/NEEDLE (DISP) 27G X 1/2"" 1 ML MISC": Status: DC

## 2013-02-15 NOTE — Telephone Encounter (Signed)
Rx for needles sent Spoke with the pt and notified that this was done He verbalized understanding and nothing further needed

## 2013-02-15 NOTE — Telephone Encounter (Signed)
Ok  Disposable syringes, 1 ml with 25-27 gauge 3/8-5/8" needles E.g. B-D 1 ml tuberculin syringes # 100  Refill prn Use to give allergy vaccine as directed

## 2013-02-15 NOTE — Telephone Encounter (Signed)
I spoke with Edward Mcdaniel and he stated he needs syringes called in to give himself allergy injections. Please advise if okay to do so Dr. Maple Hudson and if so how many. Thanks Last OV 05/01/12 Pending 02/26/13

## 2013-02-26 ENCOUNTER — Ambulatory Visit: Payer: Medicare HMO | Admitting: Internal Medicine

## 2013-03-18 ENCOUNTER — Encounter: Payer: Self-pay | Admitting: Internal Medicine

## 2013-03-18 ENCOUNTER — Ambulatory Visit (INDEPENDENT_AMBULATORY_CARE_PROVIDER_SITE_OTHER): Payer: Medicare HMO | Admitting: Internal Medicine

## 2013-03-18 VITALS — BP 118/74 | HR 44 | Ht 71.0 in | Wt 193.6 lb

## 2013-03-18 DIAGNOSIS — J45909 Unspecified asthma, uncomplicated: Secondary | ICD-10-CM

## 2013-03-18 DIAGNOSIS — J45998 Other asthma: Secondary | ICD-10-CM

## 2013-03-18 DIAGNOSIS — J302 Other seasonal allergic rhinitis: Secondary | ICD-10-CM

## 2013-03-18 DIAGNOSIS — J309 Allergic rhinitis, unspecified: Secondary | ICD-10-CM

## 2013-03-18 NOTE — Assessment & Plan Note (Signed)
Good control this year with only occasional rescue inhaler

## 2013-03-18 NOTE — Progress Notes (Signed)
Subjective:    Patient ID: Edward Mcdaniel, male    DOB: Jul 26, 1936, 77 y.o.   MRN: [id number]  HPI 02/26/11- 27 yoM former smoker followed for allergic rhinitis, asthma, complicated by DM and HBP Last here Feb 16, 2010. He mentions BP control issue so we discussed side effects of ACE inhibitors to watch for with no problems seen. He rarely uses Proair except if in [place] visiting. Altitude will take his breath. Fexofenadine not used in a month. He continues allergy vaccine with no problems. We discussed assessment of vaccine benefit and will increase the interval.  [facility] continues [facility].   02/26/12- 74 yoM former smoker followed for allergic rhinitis, asthma, complicated by DM and HBP Still on vaccine and denies any troubles with pollen or other allergens at this time. He credits to the vaccine for making the difference. Reports good control of allergic rhinitis and asthma. Getting allergy shots every 2 weeks. Occasionally decides to take an extra dose. Likes loratadine. He expects [place] to have a form from the [place] system to send records supporting ongoing need for his care here including allergy vaccine. His Has not had any wheezing recently. Only occasionally uses rescue inhaler. Denies hives cough with lisinopril. 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  05/01/12- 74 yoM former smoker followed for allergic rhinitis, asthma, complicated by DM and HBP Increased SOB at rest and with exertion and chest tightness - symtoms are off and on x 2 months.  Some coughing.  No wheezing. He continues his allergy vaccine and is followed through the [place] other medical care. Now here for an acute visit, noting increased shortness of breath especially in the last 3 or 4 weeks. Some nonproductive cough and chest tightness without chest pain or wheeze. Only modest benefit from his rescue inhaler. He has no heart history, denies swelling or leg pain except residual  from his knee replacement, without calf pain..  03/18/13- 76 yoM former smoker followed for allergic rhinitis, asthma, complicated by DM and HBP FOLLOWS FOR: slight SOB about 1 month ago but now doing fine-has had to use rescue inhaler about 2 times this spring. Most care is though [place]. Advair did help for awhile last year, but not needed since.  Credits continued allergy vaccine 1:10 GO, finding every 2 weks not enough, went back to every week.  CXR 05/06/12 IMPRESSION:  No active cardiopulmonary disease.  Original Report Authenticated By: Danae Orleans, M.D.   ROS-see HPI Constitutional:   No-   weight loss, night sweats, fevers, chills, fatigue, lassitude. HEENT:   No-  headaches, difficulty swallowing, tooth/dental problems, sore throat,       No-  sneezing, itching, ear ache, nasal congestion, post nasal drip,  CV:  No-   chest pain, orthopnea, PND, swelling in lower extremities, anasarca, dizziness, palpitations Resp: +  shortness of breath with exertion or at rest.              No-   productive cough,  No non-productive cough,  No- coughing up of blood.              No-   change in color of mucus.  No- wheezing.   Skin: No-   rash or lesions. GI:  No-   heartburn, indigestion, abdominal pain, nausea, vomiting GU: . MS:  No-   joint pain or swelling except residual discomfort right knee.Marland Kitchen   Neuro-     nothing unusual Psych:  No-  change in mood or affect. No depression or anxiety.  No memory loss.  OBJ- Physical Exam General- Alert, Oriented, Affect-appropriate, Distress- none acute, looks fit Skin- rash-none, lesions- none, excoriation- none Lymphadenopathy- none Head- atraumatic            Eyes- Gross vision intact, PERRLA, conjunctivae and secretions clear            Ears- Hearing, canals-normal            Nose- Clear, no-Septal dev, mucus, polyps, erosion, perforation             Throat- Mallampati II , mucosa clear , drainage- none, tonsils- atrophic Neck- flexible ,  trachea midline, no stridor , thyroid nl, carotid no bruit Chest - symmetrical excursion , unlabored           Heart/CV- RRR/ occ extra beat , no murmur , no gallop  , no rub, nl s1 s2                           - JVD- none , edema- none, stasis changes- none, varices- none           Lung- clear to P&A, wheeze- none, cough- none , dullness-none, rub- none           Chest wall-  Abd-  Br/ Gen/ Rectal- Not done, not indicated Extrem- cyanosis- none, clubbing, none, atrophy- none, strength- nl.  Neuro- grossly intact to observation

## 2013-03-18 NOTE — Assessment & Plan Note (Signed)
Doing well with allergy vaccine. Credits it for allowing him to play golf in springtime.

## 2013-03-18 NOTE — Patient Instructions (Signed)
We can continue allergy vaccine 1:10 GO  Please call as needed 

## 2013-03-24 ENCOUNTER — Other Ambulatory Visit: Payer: Self-pay | Admitting: Dermatology

## 2013-04-09 ENCOUNTER — Encounter (HOSPITAL_COMMUNITY): Payer: Self-pay | Admitting: *Deleted

## 2013-04-09 ENCOUNTER — Emergency Department (INDEPENDENT_AMBULATORY_CARE_PROVIDER_SITE_OTHER)
Admission: EM | Admit: 2013-04-09 | Discharge: 2013-04-09 | Disposition: A | Discharge status: Home / Self Care | Payer: Medicare HMO | Source: Home / Self Care | Attending: Family Medicine | Admitting: Family Medicine

## 2013-04-09 DIAGNOSIS — S30860A Insect bite (nonvenomous) of lower back and pelvis, initial encounter: Secondary | ICD-10-CM

## 2013-04-09 DIAGNOSIS — S30861A Insect bite (nonvenomous) of abdominal wall, initial encounter: Secondary | ICD-10-CM

## 2013-04-09 NOTE — ED Provider Notes (Signed)
History    CSN: [id number] Arrival date & time 04/09/13  1109  First MD Initiated Contact with Patient 04/09/13 1313     Chief Complaint  Patient presents with  . Tick Removal   (Consider location/radiation/quality/duration/timing/severity/associated sxs/prior Treatment) HPI Comments: 77 year old male presents complaining of retained mouth parts of the tick on the right side of his lower abdomen. He removed the tick about 12 hours ago but has not been able to get the mouth parts out of his skin. He denies any symptoms including fever, chills, NVD, or rash.  Past Medical History  Diagnosis Date  . Allergic rhinitis   . Asthma   . DM type 2 (diabetes mellitus, type 2)     diet controlled  . Hyperlipidemia   . HTN (hypertension)    Past Surgical History  Procedure Laterality Date  . Hernia repair      x 2  . Rectal surgery    . Knee surgery      bilateral  . Shoulder bone spur / tendon repair     Family History  Problem Relation Age of Onset  . Lung cancer Mother     smoker   History  Substance Use Topics  . Smoking status: Former Smoker -- 1.50 packs/day for 30 years    Types: Cigarettes    Quit date: 10/08/1987  . Smokeless tobacco: Former Neurosurgeon    Types: Chew  . Alcohol Use: Yes    Review of Systems  Constitutional: Negative for fever, chills and fatigue.  HENT: Negative for sore throat, neck pain and neck stiffness.   Eyes: Negative for visual disturbance.  Respiratory: Negative for cough and shortness of breath.   Cardiovascular: Negative for chest pain, palpitations and leg swelling.  Gastrointestinal: Negative for nausea, vomiting, abdominal pain, diarrhea and constipation.  Genitourinary: Negative for dysuria, urgency, frequency and hematuria.  Musculoskeletal: Negative for myalgias and arthralgias.  Skin: Negative for rash.       See history of present illness  Neurological: Negative for dizziness, weakness and light-headedness.    Allergies  Review  of patient's allergies indicates no known allergies.  Home Medications   Current Outpatient Rx  Name  Route  Sig  Dispense  Refill  . EXPIRED: albuterol (PROAIR HFA) 108 (90 BASE) MCG/ACT inhaler   Inhalation   Inhale 2 puffs into the lungs every 6 (six) hours as needed for wheezing or shortness of breath.   2 Inhaler   3   . aspirin 81 MG tablet   Oral   Take 81 mg by mouth daily.           . Fluticasone-Salmeterol (ADVAIR DISKUS) 250-50 MCG/DOSE AEPB      1 puff then rinse mouth, twice daily   60 each   5   . hydrochlorothiazide 25 MG tablet   Oral   Take 1 tablet by mouth Daily.         Marland Kitchen lisinopril (PRINIVIL,ZESTRIL) 20 MG tablet   Oral   Take 20 mg by mouth daily.         Marland Kitchen EXPIRED: loratadine (CLARITIN) 10 MG tablet   Oral   Take 1 tablet (10 mg total) by mouth daily.   90 tablet   3   . lovastatin (MEVACOR) 40 MG tablet   Oral   Take 40 mg by mouth at bedtime.         . traMADol (ULTRAM) 50 MG tablet   Oral   Take 50 mg by  mouth at bedtime as needed for pain.         Marland Kitchen triamcinolone cream (KENALOG) 0.1 %      As directed          There were no vitals taken for this visit. Physical Exam  Nursing note and vitals reviewed. Constitutional: He is oriented to person, place, and time. He appears well-developed and well-nourished. No distress.  HENT:  Head: Normocephalic and atraumatic.  Eyes: EOM are normal. Pupils are equal, round, and reactive to light.  Pulmonary/Chest: Effort normal.  Abdominal:    Neurological: He is oriented to person, place, and time.  Skin: Skin is warm and dry. No rash noted.  Psychiatric: He has a normal mood and affect. Judgment normal.    ED Course  Procedures (including critical care time) Labs Reviewed - No data to display No results found. 1. Tick bite of abdominal wall, initial encounter    mouth parts route. Skin cleaned with a surgical hand scrub brush  MDM  The rest of the tick has been removed  and the skin has been cleaned. He will followup if he starts to develop any signs or symptoms of tick born illness, otherwise nothing to do at this time.   Graylon Good, PA-C 04/09/13 1351

## 2013-04-09 NOTE — ED Notes (Signed)
Pt  Reports  He  Removed  A  Tick  From  r  Side  Of  His  abd   About  12  Hrs   He  Is  Not  Sure  If  He  Completely  Removed  It    He  Has  Some  Redness  Around  The  Site        He  denys  Any fever  Or  And   Rash

## 2013-04-10 NOTE — ED Provider Notes (Signed)
Medical screening examination/treatment/procedure(s) were performed by non-physician practitioner and as supervising physician I was immediately available for consultation/collaboration.   [facility]; MD  Sharin Grave, MD 04/10/13 [phone]

## 2013-07-22 ENCOUNTER — Ambulatory Visit (INDEPENDENT_AMBULATORY_CARE_PROVIDER_SITE_OTHER): Payer: Medicare HMO

## 2013-07-22 DIAGNOSIS — J309 Allergic rhinitis, unspecified: Secondary | ICD-10-CM

## 2014-01-03 ENCOUNTER — Other Ambulatory Visit: Payer: Self-pay | Admitting: Dermatology

## 2014-01-13 ENCOUNTER — Ambulatory Visit (INDEPENDENT_AMBULATORY_CARE_PROVIDER_SITE_OTHER): Payer: Medicare HMO

## 2014-01-13 DIAGNOSIS — J309 Allergic rhinitis, unspecified: Secondary | ICD-10-CM

## 2014-03-18 ENCOUNTER — Ambulatory Visit (INDEPENDENT_AMBULATORY_CARE_PROVIDER_SITE_OTHER): Payer: Medicare HMO | Admitting: Internal Medicine

## 2014-03-18 ENCOUNTER — Encounter: Payer: Self-pay | Admitting: Internal Medicine

## 2014-03-18 VITALS — BP 134/88 | HR 44 | Ht 71.0 in | Wt 191.6 lb

## 2014-03-18 DIAGNOSIS — J45998 Other asthma: Secondary | ICD-10-CM

## 2014-03-18 DIAGNOSIS — J3089 Other allergic rhinitis: Secondary | ICD-10-CM

## 2014-03-18 DIAGNOSIS — J302 Other seasonal allergic rhinitis: Secondary | ICD-10-CM

## 2014-03-18 DIAGNOSIS — J45909 Unspecified asthma, uncomplicated: Secondary | ICD-10-CM

## 2014-03-18 DIAGNOSIS — J309 Allergic rhinitis, unspecified: Secondary | ICD-10-CM

## 2014-03-18 NOTE — Patient Instructions (Signed)
We can continue allergy vaccine 1:10 GO  Please call as needed 

## 2014-03-18 NOTE — Progress Notes (Signed)
Subjective:    Patient ID: Edward Mcdaniel, male    DOB: 10/16/1935, 78 y.o.   MRN: [id number]  HPI 02/26/11- 48 yoM former smoker followed for allergic rhinitis, asthma, complicated by DM and HBP Last here Feb 16, 2010. He mentions BP control issue so we discussed side effects of ACE inhibitors to watch for with no problems seen. He rarely uses Proair except if in [place] visiting. Altitude will take his breath. Fexofenadine not used in a month. He continues allergy vaccine with no problems. We discussed assessment of vaccine benefit and will increase the interval.  [facility] continues [facility].   02/26/12- 40 yoM former smoker followed for allergic rhinitis, asthma, complicated by DM and HBP Still on vaccine and denies any troubles with pollen or other allergens at this time. He credits to the vaccine for making the difference. Reports good control of allergic rhinitis and asthma. Getting allergy shots every 2 weeks. Occasionally decides to take an extra dose. Likes loratadine. He expects [place] to have a form from the [place] system to send records supporting ongoing need for his care here including allergy vaccine. His Has not had any wheezing recently. Only occasionally uses rescue inhaler. Denies hives cough with lisinopril. 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  05/01/12- 70 yoM former smoker followed for allergic rhinitis, asthma, complicated by DM and HBP Increased SOB at rest and with exertion and chest tightness - symtoms are off and on x 2 months.  Some coughing.  No wheezing. He continues his allergy vaccine and is followed through the [place] other medical care. Now here for an acute visit, noting increased shortness of breath especially in the last 3 or 4 weeks. Some nonproductive cough and chest tightness without chest pain or wheeze. Only modest benefit from his rescue inhaler. He has no heart history, denies swelling or leg pain except residual  from his knee replacement, without calf pain..  03/18/13- 76 yoM former smoker followed for allergic rhinitis, asthma, complicated by DM and HBP FOLLOWS FOR: slight SOB about 1 month ago but now doing fine-has had to use rescue inhaler about 2 times this spring. Most care is though [place]. Advair did help for awhile last year, but not needed since.  Credits continued allergy vaccine 1:10 GO, finding every 2 weeks not enough, went back to every week.  CXR 05/06/12 IMPRESSION:  No active cardiopulmonary disease.  Original Report Authenticated By: [name], M.D.  03/18/14- 21 yoM former smoker followed for allergic rhinitis, asthma, complicated by DM and HBP FOLLOWS FOR: Pt denies problems with SOB. No current complaints.  Allergy vaccine 1:10 GO He continues to be satisfied with allergy vaccine which he believes helps and is well tolerated, taken every other week except in peak seasons. Exercises regularly. Denies any cough or wheeze.  ROS-see HPI Constitutional:   No-   weight loss, night sweats, fevers, chills, fatigue, lassitude. HEENT:   No-  headaches, difficulty swallowing, tooth/dental problems, sore throat,       No-  sneezing, itching, ear ache, nasal congestion, post nasal drip,  CV:  No-   chest pain, orthopnea, PND, swelling in lower extremities, anasarca, dizziness, palpitations Resp: +  shortness of breath with exertion or at rest.              No-   productive cough,  No non-productive cough,  No- coughing up of blood.  No-   change in color of mucus.  No- wheezing.   Skin: No-   rash or lesions. GI:  No-   heartburn, indigestion, abdominal pain, nausea, vomiting GU: . MS:  No-   joint pain or swelling except residual discomfort right knee.Marland Kitchen   Neuro-     nothing unusual Psych:  No- change in mood or affect. No depression or anxiety.  No memory loss.  OBJ- Physical Exam General- Alert, Oriented, Affect-appropriate, Distress- none acute, looks fit Skin-  rash-none, lesions- none, excoriation- none Lymphadenopathy- none Head- atraumatic            Eyes- Gross vision intact, PERRLA, conjunctivae and secretions clear            Ears- Hearing, canals-normal            Nose- Clear, no-Septal dev, mucus, polyps, erosion, perforation             Throat- Mallampati II , mucosa clear , drainage- none, tonsils- atrophic Neck- flexible , trachea midline, no stridor , thyroid nl, carotid no bruit Chest - symmetrical excursion , unlabored           Heart/CV- RRR/ occ extra beat , no murmur , no gallop  , no rub, nl s1 s2                           - JVD- none , edema- none, stasis changes- none, varices- none           Lung- +slight scattered rattle, unlabored, wheeze- none, cough- none , dullness-none,                     rub- none           Chest wall-  Abd-  Br/ Gen/ Rectal- Not done, not indicated Extrem- cyanosis- none, clubbing, none, atrophy- none, strength- nl.  Neuro- grossly intact to observation

## 2014-05-22 ENCOUNTER — Encounter: Payer: Self-pay | Admitting: Internal Medicine

## 2014-05-22 NOTE — Assessment & Plan Note (Signed)
Controlled. He is comfortable to continue present management

## 2014-05-22 NOTE — Assessment & Plan Note (Signed)
Plan-okay to continue allergy vaccine as discussed. Supplement with OTC antihistamine/nasal spray if needed

## 2014-06-28 ENCOUNTER — Other Ambulatory Visit: Payer: Self-pay | Admitting: Dermatology

## 2014-07-15 ENCOUNTER — Ambulatory Visit (INDEPENDENT_AMBULATORY_CARE_PROVIDER_SITE_OTHER): Payer: Medicare HMO

## 2014-07-15 DIAGNOSIS — J309 Allergic rhinitis, unspecified: Secondary | ICD-10-CM

## 2014-08-18 ENCOUNTER — Encounter: Payer: Self-pay | Admitting: Internal Medicine

## 2014-12-09 ENCOUNTER — Ambulatory Visit (INDEPENDENT_AMBULATORY_CARE_PROVIDER_SITE_OTHER): Payer: Medicare HMO

## 2014-12-09 DIAGNOSIS — J309 Allergic rhinitis, unspecified: Secondary | ICD-10-CM

## 2015-02-06 ENCOUNTER — Encounter: Payer: Self-pay | Admitting: Podiatry

## 2015-02-06 ENCOUNTER — Ambulatory Visit (INDEPENDENT_AMBULATORY_CARE_PROVIDER_SITE_OTHER): Payer: Medicare HMO

## 2015-02-06 ENCOUNTER — Ambulatory Visit (INDEPENDENT_AMBULATORY_CARE_PROVIDER_SITE_OTHER): Payer: Medicare HMO | Admitting: Podiatry

## 2015-02-06 VITALS — BP 197/89 | HR 63 | Resp 16 | Ht 71.0 in | Wt 185.0 lb

## 2015-02-06 DIAGNOSIS — M2042 Other hammer toe(s) (acquired), left foot: Secondary | ICD-10-CM

## 2015-02-06 DIAGNOSIS — S92912A Unspecified fracture of left toe(s), initial encounter for closed fracture: Secondary | ICD-10-CM

## 2015-02-06 NOTE — Progress Notes (Signed)
   Subjective:    Patient ID: Edward Mcdaniel, male    DOB: [birth year], 79 y.o.   MRN: [id number]  HPI Two weeks ago yesterday as i was putting on my shoes i lost my balance and turned my foot over , since the toes on left foot have been painful when walking , the 2,3, 4th toes left foot. Been using ice    Review of Systems  All other systems reviewed and are negative.      Objective:   Physical Exam: I have reviewed his past mental history medications out a surgery social history and review of systems reviewed pulses are strongly palpable. Neurologic sensorium is intact Semmes-Weinstein monofilament. Deep tendon reflexes are intact bilaterally muscle strength +5 over 5 dorsiflexion plantar flexors and inverters and everters all to his musculature is intact. Orthopedic evaluation of strength all joints distal to the ankle for range of motion without crepitation. He has pain on palpation with overlying swelling and edema and ecchymosis of the third digit proximal phalanx left foot. Radiograph today demonstrates a spiral oblique fracture nondisplaced non-comminuted of the proximal phalanx third toe left foot. Cutaneous evaluation demonstrates supple well-hydrated daily is no erythema or edema saline as drainage or odor with exception of that overlying the third toe.        Assessment & Plan:  Assessment: Fracture third digit left foot.  Plan: Discussed etiology pathology conservative versus surgical therapy. I demonstrated to him today how to wrap the toe with Coban and an since it has been more than 2 weeks I did not place him in a Darco shoe.

## 2015-03-20 ENCOUNTER — Encounter: Payer: Self-pay | Admitting: Internal Medicine

## 2015-03-20 ENCOUNTER — Ambulatory Visit (INDEPENDENT_AMBULATORY_CARE_PROVIDER_SITE_OTHER): Payer: Medicare HMO | Admitting: Internal Medicine

## 2015-03-20 VITALS — BP 134/76 | HR 56 | Ht 71.0 in | Wt 195.0 lb

## 2015-03-20 DIAGNOSIS — J309 Allergic rhinitis, unspecified: Secondary | ICD-10-CM

## 2015-03-20 DIAGNOSIS — J302 Other seasonal allergic rhinitis: Secondary | ICD-10-CM

## 2015-03-20 DIAGNOSIS — J45998 Other asthma: Secondary | ICD-10-CM

## 2015-03-20 DIAGNOSIS — J3089 Other allergic rhinitis: Principal | ICD-10-CM

## 2015-03-20 NOTE — Progress Notes (Signed)
Subjective:    Patient ID: Edward Mcdaniel, male    DOB: October 18, 1935, 79 y.o.   MRN: [id number]  HPI 02/26/11- 3 yoM former smoker followed for allergic rhinitis, asthma, complicated by DM and HBP Last here Feb 16, 2010. He mentions BP control issue so we discussed side effects of ACE inhibitors to watch for with no problems seen. He rarely uses Proair except if in [place] visiting. Altitude will take his breath. Fexofenadine not used in a month. He continues allergy vaccine with no problems. We discussed assessment of vaccine benefit and will increase the interval.  [facility].   02/26/12- 27 yoM former smoker followed for allergic rhinitis, asthma, complicated by DM and HBP Still on vaccine and denies any troubles with pollen or other allergens at this time. He credits to the vaccine for making the difference. Reports good control of allergic rhinitis and asthma. Getting allergy shots every 2 weeks. Occasionally decides to take an extra dose. Likes loratadine. He expects [place] to have a form from the [place] system to send records supporting ongoing need for his care here including allergy vaccine. His Has not had any wheezing recently. Only occasionally uses rescue inhaler. Denies hives cough with lisinopril. 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  05/01/12- 61 yoM former smoker followed for allergic rhinitis, asthma, complicated by DM and HBP Increased SOB at rest and with exertion and chest tightness - symtoms are off and on x 2 months.  Some coughing.  No wheezing. He continues his allergy vaccine and is followed through the [place] other medical care. Now here for an acute visit, noting increased shortness of breath especially in the last 3 or 4 weeks. Some nonproductive cough and chest tightness without chest pain or wheeze. Only modest benefit from his rescue inhaler. He has no heart history, denies swelling or leg pain except residual  from his knee replacement, without calf pain..  03/18/13- 76 yoM former smoker followed for allergic rhinitis, asthma, complicated by DM and HBP FOLLOWS FOR: slight SOB about 1 month ago but now doing fine-has had to use rescue inhaler about 2 times this spring. Most care is though [place]. Advair did help for awhile last year, but not needed since.  Credits continued allergy vaccine 1:10 GO, finding every 2 weeks not enough, went back to every week.  CXR 05/06/12 IMPRESSION:  No active cardiopulmonary disease.  Original Report Authenticated By: [name], M.D.  03/18/14- 70 yoM former smoker followed for allergic rhinitis, asthma, complicated by DM and HBP FOLLOWS FOR: Pt denies problems with SOB. No current complaints.  Allergy vaccine 1:10 GO He continues to be satisfied with allergy vaccine which he believes helps and is well tolerated, taken every other week except in peak seasons. Exercises regularly. Denies any cough or wheeze.  03/20/15-  36 yoM former smoker followed for allergic rhinitis, asthma, complicated by DM and HBP    Allergy vaccine 1:10 GO  Reports:FOLLOWS FOR: pt has no complaints today.  tolerating allergy vaccine well.  He is satisfied with control and feels he did pretty well through the Spring with only one small interval of flareup. He gets his medications through the [place]. Very rare need for rescue inhaler and asthma does not wake him.    ROS-see HPI Constitutional:   No-   weight loss, night sweats, fevers, chills, fatigue, lassitude. HEENT:   No-  headaches, difficulty swallowing, tooth/dental problems, sore throat,  No-  sneezing, itching, ear ache, nasal congestion, post nasal drip,  CV:  No-   chest pain, orthopnea, PND, swelling in lower extremities, anasarca, dizziness, palpitations Resp: +  shortness of breath with exertion or at rest.              No-   productive cough,  No non-productive cough,  No- coughing up of blood.              No-   change in  color of mucus.  No- wheezing.   Skin: No-   rash or lesions. GI:  No-   heartburn, indigestion, abdominal pain, nausea, vomiting GU: . MS:  No-   joint pain or swelling except residual discomfort right knee.Marland Kitchen   Neuro-     nothing unusual Psych:  No- change in mood or affect. No depression or anxiety.  No memory loss.  OBJ- Physical Exam General- Alert, Oriented, Affect-appropriate, Distress- none acute, looks fit Skin- rash-none, lesions- none, excoriation- none Lymphadenopathy- none Head- atraumatic            Eyes- Gross vision intact, PERRLA, conjunctivae and secretions clear            Ears- Hearing, canals-normal            Nose- Clear, no-Septal dev, mucus, polyps, erosion, perforation             Throat- Mallampati II , mucosa clear , drainage- none, tonsils- atrophic Neck- flexible , trachea midline, no stridor , thyroid nl, carotid no bruit Chest - symmetrical excursion , unlabored           Heart/CV- RRR/ occ extra beat , no murmur , no gallop  , no rub, nl s1 s2                           - JVD- none , edema- none, stasis changes- none, varices- none           Lung- +slight scattered rattle, unlabored, wheeze- none, cough- none , dullness-none,  rub- none           Chest wall-  Abd-  Br/ Gen/ Rectal- Not done, not indicated Extrem- cyanosis- none, clubbing, none, atrophy- none, strength- nl.  Neuro- grossly intact to observation

## 2015-03-20 NOTE — Patient Instructions (Signed)
We can continue allergy vaccine 1:10 GO  No need to make changes since you are doing so well  Please call if we can help

## 2015-03-29 ENCOUNTER — Telehealth: Payer: Self-pay | Admitting: Internal Medicine

## 2015-03-29 ENCOUNTER — Ambulatory Visit (INDEPENDENT_AMBULATORY_CARE_PROVIDER_SITE_OTHER): Payer: Medicare HMO

## 2015-03-29 DIAGNOSIS — J309 Allergic rhinitis, unspecified: Secondary | ICD-10-CM | POA: Diagnosis not present

## 2015-04-17 NOTE — Assessment & Plan Note (Signed)
Mild intermittent uncomplicated well-controlled Plan-no changes necessary. Medications discussed.

## 2015-04-17 NOTE — Assessment & Plan Note (Signed)
He is very satisfied with allergy vaccine. Plan continue vaccine 1 more year.

## 2015-04-21 NOTE — Telephone Encounter (Signed)
Date Mixed: 03/29/15 Vial: 2 Strength: 1:10 Here/Mail/Pick Up: mail Mixed By: tbs 

## 2015-06-02 ENCOUNTER — Encounter: Payer: Self-pay | Admitting: Internal Medicine

## 2015-08-15 ENCOUNTER — Other Ambulatory Visit: Payer: Self-pay | Admitting: Dermatology

## 2015-08-18 ENCOUNTER — Telehealth: Payer: Self-pay | Admitting: Internal Medicine

## 2015-08-18 ENCOUNTER — Ambulatory Visit (INDEPENDENT_AMBULATORY_CARE_PROVIDER_SITE_OTHER): Payer: Medicare HMO

## 2015-08-18 DIAGNOSIS — J309 Allergic rhinitis, unspecified: Secondary | ICD-10-CM

## 2015-08-18 NOTE — Telephone Encounter (Signed)
Allergy Serum Extract Date Mixed: 08/18/15 Vial: 2 Strength: 1:10 Here/Mail/Pick Up: mail Mixed By: tbs Last OV: 03/20/15 Pending OV: 03/19/16

## 2016-01-26 ENCOUNTER — Telehealth: Payer: Self-pay | Admitting: Internal Medicine

## 2016-01-26 DIAGNOSIS — J309 Allergic rhinitis, unspecified: Secondary | ICD-10-CM | POA: Diagnosis not present

## 2016-01-26 NOTE — Telephone Encounter (Signed)
Allergy Serum Extract Date Mixed: 01/26/16 Vial: 2 Strength: 1:10 Here/Mail/Pick Up: mail Mixed By: tbs Last OV: 03/20/15 Pending OV: 03/19/16

## 2016-03-19 ENCOUNTER — Ambulatory Visit (INDEPENDENT_AMBULATORY_CARE_PROVIDER_SITE_OTHER): Payer: Medicare HMO | Admitting: Internal Medicine

## 2016-03-19 ENCOUNTER — Encounter: Payer: Self-pay | Admitting: Internal Medicine

## 2016-03-19 VITALS — BP 114/70 | HR 46 | Ht 71.0 in | Wt 192.0 lb

## 2016-03-19 DIAGNOSIS — J4520 Mild intermittent asthma, uncomplicated: Secondary | ICD-10-CM

## 2016-03-19 DIAGNOSIS — J309 Allergic rhinitis, unspecified: Secondary | ICD-10-CM | POA: Diagnosis not present

## 2016-03-19 DIAGNOSIS — J302 Other seasonal allergic rhinitis: Secondary | ICD-10-CM

## 2016-03-19 DIAGNOSIS — J3089 Other allergic rhinitis: Principal | ICD-10-CM

## 2016-03-19 MED ORDER — ALBUTEROL SULFATE HFA 108 (90 BASE) MCG/ACT IN AERS: 2.0000 | INHALATION_SPRAY | Freq: Four times a day (QID) | RESPIRATORY_TRACT | Status: DC | PRN

## 2016-03-19 MED ORDER — LORATADINE 10 MG PO TABS: 10.0000 mg | ORAL_TABLET | Freq: Every day | ORAL | Status: DC | PRN

## 2016-03-19 NOTE — Progress Notes (Signed)
Subjective:    Patient ID: Edward Mcdaniel, male    DOB: June 01, 1936, 80 y.o.   MRN: [id number]  HPI 02/26/11- 56 yoM former smoker followed for allergic rhinitis, asthma, complicated by DM and HBP Last here Feb 16, 2010. He mentions BP control issue so we discussed side effects of ACE inhibitors to watch for with no problems seen. He rarely uses Proair except if in [place] visiting. Altitude will take his breath. Fexofenadine not used in a month. He continues allergy vaccine with no problems. We discussed assessment of vaccine benefit and will increase the interval.  [facility] continues [facility].   02/26/12- 52 yoM former smoker followed for allergic rhinitis, asthma, complicated by DM and HBP Still on vaccine and denies any troubles with pollen or other allergens at this time. He credits to the vaccine for making the difference. Reports good control of allergic rhinitis and asthma. Getting allergy shots every 2 weeks. Occasionally decides to take an extra dose. Likes loratadine. He expects [place] to have a form from the [place] system to send records supporting ongoing need for his care here including allergy vaccine. His Has not had any wheezing recently. Only occasionally uses rescue inhaler. Denies hives cough with lisinopril. 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  05/01/12- 54 yoM former smoker followed for allergic rhinitis, asthma, complicated by DM and HBP Increased SOB at rest and with exertion and chest tightness - symtoms are off and on x 2 months.  Some coughing.  No wheezing. He continues his allergy vaccine and is followed through the [place] other medical care. Now here for an acute visit, noting increased shortness of breath especially in the last 3 or 4 weeks. Some nonproductive cough and chest tightness without chest pain or wheeze. Only modest benefit from his rescue inhaler. He has no heart history, denies swelling or leg pain except residual  from his knee replacement, without calf pain..  03/18/13- 76 yoM former smoker followed for allergic rhinitis, asthma, complicated by DM and HBP FOLLOWS FOR: slight SOB about 1 month ago but now doing fine-has had to use rescue inhaler about 2 times this spring. Most care is though [place]. Advair did help for awhile last year, but not needed since.  Credits continued allergy vaccine 1:10 GO, finding every 2 weeks not enough, went back to every week.  CXR 05/06/12 IMPRESSION:  No active cardiopulmonary disease.  Original Report Authenticated By: [name], M.D.  03/18/14- 15 yoM former smoker followed for allergic rhinitis, asthma, complicated by DM and HBP FOLLOWS FOR: Pt denies problems with SOB. No current complaints.  Allergy vaccine 1:10 GO He continues to be satisfied with allergy vaccine which he believes helps and is well tolerated, taken every other week except in peak seasons. Exercises regularly. Denies any cough or wheeze.  03/20/15-  89 yoM former smoker followed for allergic rhinitis, asthma, complicated by DM and HBP    Allergy vaccine 1:10 GO  Reports:FOLLOWS FOR: pt has no complaints today.  tolerating allergy vaccine well.  He is satisfied with control and feels he did pretty well through the Spring with only one small interval of flareup. He gets his medications through the [place]. Very rare need for rescue inhaler and asthma does not wake him.  03/19/2016-80 year old male former smoker followed for allergic rhinitis, asthma, complicated by DM, HBP Allergy Vaccine 1:10 GO FOLLOWS FOR: Pt is still on allergy vaccine and denies any issues with vaccine or allergies at  this time. Has felt more tired and more easily short of breath for 4 or 5 months and blames "allergy" until he was found to be in atrial fibrillation. He is pending treatment and blood thinner through [place]. Playing golf 2 or 3 times a week. Has not used pro-air albuterol inhaler more than 2 or 3 times in the last year     ROS-see HPI Constitutional:   No-   weight loss, night sweats, fevers, chills, fatigue, lassitude. HEENT:   No-  headaches, difficulty swallowing, tooth/dental problems, sore throat,       No-  sneezing, itching, ear ache, nasal congestion, post nasal drip,  CV:  No-   chest pain, orthopnea, PND, swelling in lower extremities, anasarca, dizziness, palpitations Resp: +  shortness of breath with exertion or at rest.              No-   productive cough,  No non-productive cough,  No- coughing up of blood.              No-   change in color of mucus.  No- wheezing.   Skin: No-   rash or lesions. GI:  No-   heartburn, indigestion, abdominal pain, nausea, vomiting GU: . MS:  No-   joint pain or swelling except residual discomfort right knee.Marland Kitchen   Neuro-     nothing unusual Psych:  No- change in mood or affect. No depression or anxiety.  No memory loss.  OBJ- Physical Exam General- Alert, Oriented, Affect-appropriate, Distress- none acute, looks fit Skin- rash-none, lesions- none, excoriation- none Lymphadenopathy- none Head- atraumatic            Eyes- Gross vision intact, PERRLA, conjunctivae and secretions clear            Ears- Hearing, canals-normal            Nose- Clear, no-Septal dev, mucus, polyps, erosion, perforation             Throat- Mallampati II , mucosa clear , drainage- none, tonsils- atrophic Neck- flexible , trachea midline, no stridor , thyroid nl, carotid no bruit Chest - symmetrical excursion , unlabored           Heart/CV- IRR/ AFib , no murmur , no gallop  , no rub, nl s1 s2                           - JVD- none , edema- none, stasis changes- none, varices- none           Lung- clear, unlabored, wheeze- none, cough- none , dullness-none,  rub- none           Chest wall-  Abd-  Br/ Gen/ Rectal- Not done, not indicated Extrem- cyanosis- none, clubbing, none, atrophy- none, strength- nl.  Neuro- grossly intact to observation

## 2016-03-19 NOTE — Patient Instructions (Addendum)
Ok to stop allergy vaccine at any time. You can stop now if you wish.   You can get scripts for your Proair and Claritin through the [place] as needed- refills provided today.  You can arrange to see a lung doctor at the [facility] if you wish, or come back to this office for asthma care, if needed

## 2016-03-19 NOTE — Assessment & Plan Note (Signed)
Currently uncomplicated, well controlled. Using rescue inhaler only 2 or 3 times a year. He does ask refilled keep it available.

## 2016-03-19 NOTE — Assessment & Plan Note (Signed)
We discussed the anticipated closing of our allergy clinic in a year. He will decide when to quit his shots with my suggestion that he wait a while to see if he needs an allergist or can be managed comfortably with antihistamines and nasal sprays. He is moving more of his care to the [place] in [place].

## 2016-11-13 ENCOUNTER — Other Ambulatory Visit: Payer: Self-pay | Admitting: Dermatology

## 2017-07-08 ENCOUNTER — Other Ambulatory Visit: Payer: Self-pay | Admitting: Dermatology

## 2018-02-25 ENCOUNTER — Other Ambulatory Visit: Payer: Self-pay | Admitting: Dermatology

## 2018-03-25 ENCOUNTER — Other Ambulatory Visit: Payer: Self-pay | Admitting: Dermatology

## 2020-02-15 ENCOUNTER — Encounter: Payer: Self-pay | Admitting: Dermatology

## 2020-02-15 ENCOUNTER — Ambulatory Visit (INDEPENDENT_AMBULATORY_CARE_PROVIDER_SITE_OTHER): Payer: Medicare HMO | Admitting: Dermatology

## 2020-02-15 ENCOUNTER — Other Ambulatory Visit: Payer: Self-pay

## 2020-02-15 DIAGNOSIS — D485 Neoplasm of uncertain behavior of skin: Secondary | ICD-10-CM

## 2020-02-15 DIAGNOSIS — L821 Other seborrheic keratosis: Secondary | ICD-10-CM | POA: Diagnosis not present

## 2020-02-15 DIAGNOSIS — Z85828 Personal history of other malignant neoplasm of skin: Secondary | ICD-10-CM

## 2020-02-15 DIAGNOSIS — L570 Actinic keratosis: Secondary | ICD-10-CM

## 2020-02-15 NOTE — Progress Notes (Signed)
   Follow-Up Visit   Subjective  Edward Mcdaniel is a 84 y.o. male who presents for the following: Skin Problem (both ears crust for months).  New crusts Location: Face and right ear Duration: Several months Quality: Increased size Associated Signs/Symptoms: Left back cheek occasionally sore Modifying Factors:  Severity:  Timing: Context: History of skin cancer  The following portions of the chart were reviewed this encounter and updated as appropriate: Tobacco  Allergies  Meds  Problems  Med Hx  Surg Hx  Fam Hx      Objective  Well appearing patient in no apparent distress; mood and affect are within normal limits.  All skin waist up examined.   Assessment & Plan  AK (actinic keratosis) (5) Right Zygomatic Area; Left Forehead (3); Right Forehead  Destruction of lesion - Left Forehead, Right Forehead, Right Zygomatic Area Complexity: simple   Destruction method: cryotherapy   Lesion destroyed using liquid nitrogen: Yes   Cryotherapy cycles:  5 Outcome: patient tolerated procedure well with no complications    Neoplasm of uncertain behavior of skin (2) Left Zygomatic Area  Skin / nail biopsy Type of biopsy: tangential   Informed consent: discussed and consent obtained   Timeout: patient name, date of birth, surgical site, and procedure verified   Anesthesia: the lesion was anesthetized in a standard fashion   Anesthetic:  1% lidocaine w/ epinephrine 1-100,000 local infiltration Instrument used: flexible razor blade   Hemostasis achieved with: aluminum chloride   Outcome: patient tolerated procedure well   Post-procedure details: wound care instructions given    Specimen 1 - Surgical pathology Differential Diagnosis: R/O BCC vs SCC Check Margins: No  Right Mid Antihelix  Skin / nail biopsy Type of biopsy: tangential   Informed consent: discussed and consent obtained   Timeout: patient name, date of birth, surgical site, and procedure verified    Anesthesia: the lesion was anesthetized in a standard fashion   Anesthetic:  1% lidocaine w/ epinephrine 1-100,000 local infiltration Instrument used: flexible razor blade   Hemostasis achieved with: aluminum chloride   Outcome: patient tolerated procedure well   Post-procedure details: wound care instructions given    Specimen 2 - Surgical pathology Differential Diagnosis: R/O BCC vs SCC Check Margins: No For Edward Mcdaniel.  New thick spots in front of the left ear and on the right mid antihelix are possible nonmelanoma cancers.  Shave biopsy obtained for each and Edward Mcdaniel will apply a little Vaseline or triple antibiotic ointment daily.  He can call the office on Thursday or Friday to discuss the biopsy result.  Smaller crusts on the head and neck represent solar keratoses, precancers, and these were treated with simple liquid nitrogen freeze.  These areas will swell and may peel in the next 2 weeks.  A new dark spot on the right collarbone represents a benign seborrheic keratosis; dermoscopy confirmed.  It does not need removal unless there is obvious clinical change the rest of the skin examination from the waist up is otherwise normal.

## 2020-02-15 NOTE — Patient Instructions (Addendum)
Biopsy, Surgery (Curettage) & Surgery (Excision) Aftercare Instructions  1. Okay to remove bandage in 24 hours  2. Wash area with soap and water  3. Apply Vaseline to area twice daily until healed (Not Neosporin)  4. Okay to cover with a Band-Aid to decrease the chance of infection or prevent irritation from clothing; also it's okay to uncover lesion at home.  5. Suture instructions: return to our office in 7-10 or 10-14 days for a nurse visit for suture removal. Variable healing with sutures, if pain or itching occurs call our office. It's okay to shower or bathe 24 hours after sutures are given.  6. The following risks may occur after a biopsy, curettage or excision: bleeding, scarring, discoloration, recurrence, infection (redness, yellow drainage, pain or swelling).  7. For questions, concerns and results call our office at [place] before 4pm & Friday before 3pm. Biopsy results will be available in 1 week.                                                                                                                                                                                                  rr For Edward Mcdaniel.  New thick spots in front of the left ear and on the right mid antihelix are possible nonmelanoma cancers.  Shave biopsy obtained for each and [name] will apply a little Vaseline or triple antibiotic ointment daily.  He can call the office on Thursday or Friday to discuss the biopsy result.  Smaller crusts on the head and neck represent solar keratoses, precancers, and these were treated with simple liquid nitrogen freeze.  These areas will swell and may peel in the next 2 weeks.  A new dark spot on the right collarbone represents a benign seborrheic keratosis; dermoscopy confirmed.  It does not need removal unless there is obvious clinical change the rest of the skin examination from the waist up is otherwise normal.

## 2020-02-18 ENCOUNTER — Telehealth: Payer: Self-pay

## 2020-02-18 NOTE — Telephone Encounter (Signed)
-----   Message from Lavonna Monarch, MD sent at 02/17/2020  9:45 PM EDT ----- Schedule surgery with Dr. Darene Lamer

## 2020-02-18 NOTE — Telephone Encounter (Signed)
Pathology results to patient. Has a 30 minute appointment already scheduled with ST on 03/09/2020.

## 2020-03-09 ENCOUNTER — Encounter: Payer: Self-pay | Admitting: Dermatology

## 2020-03-09 ENCOUNTER — Other Ambulatory Visit: Payer: Self-pay

## 2020-03-09 ENCOUNTER — Ambulatory Visit (INDEPENDENT_AMBULATORY_CARE_PROVIDER_SITE_OTHER): Payer: Medicare HMO | Admitting: Dermatology

## 2020-03-09 DIAGNOSIS — C4492 Squamous cell carcinoma of skin, unspecified: Secondary | ICD-10-CM

## 2020-03-09 DIAGNOSIS — C44329 Squamous cell carcinoma of skin of other parts of face: Secondary | ICD-10-CM

## 2020-03-09 NOTE — Progress Notes (Signed)
Left zygomatic  cx3 66fu = exc  5.0- vicryl x1 5.0 - ethilon x5  EXC size 3.0 Superior margin stained

## 2020-03-09 NOTE — Patient Instructions (Signed)

## 2020-03-16 ENCOUNTER — Other Ambulatory Visit: Payer: Self-pay

## 2020-03-16 ENCOUNTER — Encounter: Payer: Self-pay | Admitting: *Deleted

## 2020-03-16 ENCOUNTER — Ambulatory Visit (INDEPENDENT_AMBULATORY_CARE_PROVIDER_SITE_OTHER): Payer: Medicare HMO | Admitting: *Deleted

## 2020-03-16 DIAGNOSIS — Z4802 Encounter for removal of sutures: Secondary | ICD-10-CM

## 2020-03-16 DIAGNOSIS — C4492 Squamous cell carcinoma of skin, unspecified: Secondary | ICD-10-CM

## 2020-03-16 DIAGNOSIS — C44329 Squamous cell carcinoma of skin of other parts of face: Secondary | ICD-10-CM

## 2020-03-16 NOTE — Progress Notes (Signed)
Here for suture removal left zygomatic area. Pathology results to patient. Healing well

## 2020-03-19 ENCOUNTER — Encounter: Payer: Self-pay | Admitting: Dermatology

## 2020-03-19 NOTE — Progress Notes (Signed)
   Follow-Up Visit   Subjective  GRAINGER MCCARLEY is a 84 y.o. male who presents for the following: Procedure (scc x2).  SCCA Location: Left sideburn area Duration:  Quality:  Associated Signs/Symptoms: Modifying Factors:  Severity:  Timing: Context: For treatment  The following portions of the chart were reviewed this encounter and updated as appropriate: Tobacco  Allergies  Meds  Problems  Med Hx  Surg Hx  Fam Hx      Objective  Well appearing patient in no apparent distress; mood and affect are within normal limits.  A focused examination was performed including Head and neck.. Relevant physical exam findings are noted in the Assessment and Plan.   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  Skin excision  Lesion length (cm):  3 Lesion width (cm):  1 Margin per side (cm):  0.1 Total excision diameter (cm):  3.2 Informed consent: discussed and consent obtained   Timeout: patient name, date of birth, surgical site, and procedure verified   Procedure prep:  Patient was prepped and draped in usual sterile fashion Prep type:  Chlorhexidine Anesthesia: the lesion was anesthetized in a standard fashion   Anesthetic:  1% lidocaine w/ epinephrine 1-100,000 local infiltration Instrument used: #15 blade   Hemostasis achieved with: suture   Outcome: patient tolerated procedure well with no complications   Post-procedure details: sterile dressing applied and wound care instructions given   Dressing type: petrolatum   Additional details:  Initial curettage x3 showed the lesion to be both deep and moderately wide, diameter 3.0.  Narrow margin excision with simple closure 5-0 Ethilon x5.  Skin excision  Specimen 1 - Surgical pathology Differential Diagnosis: scc Cx3 65fu = EXC SUPERIOR MARGIN STAINED 3.0 TOTAL SIZE Check Margins: yes

## 2020-04-20 ENCOUNTER — Ambulatory Visit (INDEPENDENT_AMBULATORY_CARE_PROVIDER_SITE_OTHER): Payer: Medicare HMO | Admitting: Dermatology

## 2020-04-20 ENCOUNTER — Encounter: Payer: Self-pay | Admitting: Dermatology

## 2020-04-20 ENCOUNTER — Other Ambulatory Visit: Payer: Self-pay

## 2020-04-20 DIAGNOSIS — D0421 Carcinoma in situ of skin of right ear and external auricular canal: Secondary | ICD-10-CM

## 2020-04-20 DIAGNOSIS — D099 Carcinoma in situ, unspecified: Secondary | ICD-10-CM

## 2020-04-20 NOTE — Patient Instructions (Addendum)

## 2020-04-20 NOTE — Progress Notes (Signed)
1.0 cm tretament size cx3 102fu right mid antiheix

## 2020-05-15 ENCOUNTER — Encounter: Payer: Self-pay | Admitting: Dermatology

## 2020-05-15 NOTE — Progress Notes (Signed)
   Follow-Up Visit   Subjective  Edward Mcdaniel is a 84 y.o. male who presents for the following: Procedure (here for treatment- Right mid antihelix- cis x 1). CIS location: Right ear Duration:  Quality:  Associated Signs/Symptoms: Modifying Factors:  Severity:  Timing: Context: For treatment  Objective  Well appearing patient in no apparent distress; mood and affect are within normal limits.  A focused examination was performed including Head and neck. Relevant physical exam findings are noted in the Assessment and Plan.   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  Destruction of lesion Complexity: simple   Destruction method: electrodesiccation and curettage   Informed consent: discussed and consent obtained   Timeout:  patient name, date of birth, surgical site, and procedure verified Anesthesia: the lesion was anesthetized in a standard fashion   Anesthetic:  1% lidocaine w/ epinephrine 1-100,000 local infiltration Curettage performed in three different directions: Yes   Curettage cycles:  3 Lesion length (cm):  1.2 Lesion width (cm):  1 Margin per side (cm):  0 Final wound size (cm):  1.2 Hemostasis achieved with:  ferric subsulfate Outcome: patient tolerated procedure well with no complications   Post-procedure details: wound care instructions given   Additional details:  Inoculated with parenteral 5% fluorouracil      I, Lavonna Monarch, MD, have reviewed all documentation for this visit.  The documentation on 05/15/20 for the exam, diagnosis, procedures, and orders are all accurate and complete.

## 2020-08-21 ENCOUNTER — Encounter: Payer: Self-pay | Admitting: Dermatology

## 2020-08-21 ENCOUNTER — Other Ambulatory Visit: Payer: Self-pay

## 2020-08-21 ENCOUNTER — Ambulatory Visit (INDEPENDENT_AMBULATORY_CARE_PROVIDER_SITE_OTHER): Payer: Medicare HMO | Admitting: Dermatology

## 2020-08-21 DIAGNOSIS — Z1283 Encounter for screening for malignant neoplasm of skin: Secondary | ICD-10-CM

## 2020-08-21 DIAGNOSIS — Z85828 Personal history of other malignant neoplasm of skin: Secondary | ICD-10-CM

## 2020-08-21 DIAGNOSIS — L570 Actinic keratosis: Secondary | ICD-10-CM

## 2020-08-22 ENCOUNTER — Encounter: Payer: Self-pay | Admitting: Dermatology

## 2020-08-22 NOTE — Progress Notes (Signed)
   Follow-Up Visit   Subjective  Edward Mcdaniel is a 84 y.o. male who presents for the following: Follow-up (3 month f/u- right superior crus of antihelix).  Follow-up skin cancer Location: Right ear Duration:  Quality: Clear associated Signs/Symptoms: Modifying Factors:  Severity:  Timing: Context: Multiple other crusts he would like looked at.  Objective  Well appearing patient in no apparent distress; mood and affect are within normal limits.  All skin waist up examined.   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  Waist up examination- right superior crus of antihelix clear- recheck in 8-12 months  AK (actinic keratosis) (13) Left Forearm - Posterior (2); Right Forearm - Posterior (3); Left Inferior Crus of Antihelix; Left Parietal Scalp (2); Left Parotid Area (3); Right Parotid Area (2)  Destruction of lesion - Left Forearm - Posterior, Left Inferior Crus of Antihelix, Left Parietal Scalp, Left Parotid Area, Right Forearm - Posterior, Right Parotid Area Complexity: simple   Destruction method: cryotherapy   Informed consent: discussed and consent obtained   Timeout:  patient name, date of birth, surgical site, and procedure verified Lesion destroyed using liquid nitrogen: Yes   Cryotherapy cycles:  5 Outcome: patient tolerated procedure well with no complications   Post-procedure details: wound care instructions given    Encounter for screening for malignant neoplasm of skin Mid Back  Annual skin examination     I, Lavonna Monarch, MD, have reviewed all documentation for this visit.  The documentation on 08/22/20 for the exam, diagnosis, procedures, and orders are all accurate and complete.

## 2020-12-12 ENCOUNTER — Encounter (HOSPITAL_COMMUNITY): Payer: Self-pay

## 2020-12-12 ENCOUNTER — Inpatient Hospital Stay (HOSPITAL_COMMUNITY)
Admission: EM | Admit: 2020-12-12 | Discharge: 2020-12-14 | DRG: 287 | Disposition: A | Discharge status: Home / Self Care | Payer: No Typology Code available for payment source | Attending: Cardiovascular Disease | Admitting: Cardiovascular Disease

## 2020-12-12 ENCOUNTER — Other Ambulatory Visit: Payer: Self-pay

## 2020-12-12 ENCOUNTER — Emergency Department (HOSPITAL_COMMUNITY): Payer: No Typology Code available for payment source

## 2020-12-12 DIAGNOSIS — Z7984 Long term (current) use of oral hypoglycemic drugs: Secondary | ICD-10-CM | POA: Diagnosis not present

## 2020-12-12 DIAGNOSIS — I208 Other forms of angina pectoris: Secondary | ICD-10-CM | POA: Diagnosis not present

## 2020-12-12 DIAGNOSIS — I083 Combined rheumatic disorders of mitral, aortic and tricuspid valves: Secondary | ICD-10-CM | POA: Diagnosis present

## 2020-12-12 DIAGNOSIS — Z79899 Other long term (current) drug therapy: Secondary | ICD-10-CM

## 2020-12-12 DIAGNOSIS — Z20822 Contact with and (suspected) exposure to covid-19: Secondary | ICD-10-CM | POA: Diagnosis present

## 2020-12-12 DIAGNOSIS — I4821 Permanent atrial fibrillation: Secondary | ICD-10-CM | POA: Diagnosis present

## 2020-12-12 DIAGNOSIS — R072 Precordial pain: Secondary | ICD-10-CM | POA: Diagnosis not present

## 2020-12-12 DIAGNOSIS — Z85828 Personal history of other malignant neoplasm of skin: Secondary | ICD-10-CM | POA: Diagnosis not present

## 2020-12-12 DIAGNOSIS — I4891 Unspecified atrial fibrillation: Secondary | ICD-10-CM

## 2020-12-12 DIAGNOSIS — I429 Cardiomyopathy, unspecified: Secondary | ICD-10-CM | POA: Diagnosis present

## 2020-12-12 DIAGNOSIS — E1122 Type 2 diabetes mellitus with diabetic chronic kidney disease: Secondary | ICD-10-CM | POA: Diagnosis present

## 2020-12-12 DIAGNOSIS — J45909 Unspecified asthma, uncomplicated: Secondary | ICD-10-CM | POA: Diagnosis present

## 2020-12-12 DIAGNOSIS — I25110 Atherosclerotic heart disease of native coronary artery with unstable angina pectoris: Secondary | ICD-10-CM | POA: Diagnosis present

## 2020-12-12 DIAGNOSIS — G4733 Obstructive sleep apnea (adult) (pediatric): Secondary | ICD-10-CM | POA: Diagnosis present

## 2020-12-12 DIAGNOSIS — E119 Type 2 diabetes mellitus without complications: Secondary | ICD-10-CM

## 2020-12-12 DIAGNOSIS — I700 Atherosclerosis of aorta: Secondary | ICD-10-CM | POA: Diagnosis present

## 2020-12-12 DIAGNOSIS — Z7901 Long term (current) use of anticoagulants: Secondary | ICD-10-CM

## 2020-12-12 DIAGNOSIS — I129 Hypertensive chronic kidney disease with stage 1 through stage 4 chronic kidney disease, or unspecified chronic kidney disease: Secondary | ICD-10-CM | POA: Diagnosis present

## 2020-12-12 DIAGNOSIS — R001 Bradycardia, unspecified: Secondary | ICD-10-CM | POA: Diagnosis present

## 2020-12-12 DIAGNOSIS — I10 Essential (primary) hypertension: Secondary | ICD-10-CM | POA: Diagnosis present

## 2020-12-12 DIAGNOSIS — R079 Chest pain, unspecified: Secondary | ICD-10-CM | POA: Diagnosis present

## 2020-12-12 DIAGNOSIS — N1831 Chronic kidney disease, stage 3a: Secondary | ICD-10-CM | POA: Diagnosis present

## 2020-12-12 DIAGNOSIS — I200 Unstable angina: Secondary | ICD-10-CM | POA: Insufficient documentation

## 2020-12-12 DIAGNOSIS — Z8249 Family history of ischemic heart disease and other diseases of the circulatory system: Secondary | ICD-10-CM | POA: Diagnosis not present

## 2020-12-12 DIAGNOSIS — Z87891 Personal history of nicotine dependence: Secondary | ICD-10-CM

## 2020-12-12 DIAGNOSIS — E785 Hyperlipidemia, unspecified: Secondary | ICD-10-CM | POA: Diagnosis present

## 2020-12-12 DIAGNOSIS — E7800 Pure hypercholesterolemia, unspecified: Secondary | ICD-10-CM | POA: Diagnosis present

## 2020-12-12 HISTORY — DX: Heart disease, unspecified: I51.9

## 2020-12-12 HISTORY — DX: Other persistent atrial fibrillation: I48.19

## 2020-12-12 LAB — MAGNESIUM: Magnesium: 1.9 mg/dL (ref 1.7–2.4)

## 2020-12-12 LAB — TROPONIN I (HIGH SENSITIVITY)
Troponin I (High Sensitivity): 32 ng/L — ABNORMAL HIGH (ref <18)
Troponin I (High Sensitivity): 32 ng/L — ABNORMAL HIGH (ref <18)
Troponin I (High Sensitivity): 36 ng/L — ABNORMAL HIGH (ref <18)

## 2020-12-12 LAB — TSH: TSH: 0.977 u[IU]/mL (ref 0.350–4.500)

## 2020-12-12 LAB — CBC
HCT: 45.1 % (ref 39.0–52.0)
Hemoglobin: 14.6 g/dL (ref 13.0–17.0)
MCH: 32.4 pg (ref 26.0–34.0)
MCHC: 32.4 g/dL (ref 30.0–36.0)
MCV: 100 fL (ref 80.0–100.0)
Platelets: 153 10*3/uL (ref 150–400)
RBC: 4.51 MIL/uL (ref 4.22–5.81)
RDW: 14.7 % (ref 11.5–15.5)
WBC: 6 10*3/uL (ref 4.0–10.5)
nRBC: 0 % (ref 0.0–0.2)

## 2020-12-12 LAB — BASIC METABOLIC PANEL
Anion gap: 8 (ref 5–15)
BUN: 17 mg/dL (ref 8–23)
CO2: 27 mmol/L (ref 22–32)
Calcium: 9.3 mg/dL (ref 8.9–10.3)
Chloride: 101 mmol/L (ref 98–111)
Creatinine, Ser: 1.31 mg/dL — ABNORMAL HIGH (ref 0.61–1.24)
GFR, Estimated: 54 mL/min — ABNORMAL LOW (ref >60)
Glucose, Bld: 172 mg/dL — ABNORMAL HIGH (ref 70–99)
Potassium: 3.9 mmol/L (ref 3.5–5.1)
Sodium: 136 mmol/L (ref 135–145)

## 2020-12-12 LAB — BRAIN NATRIURETIC PEPTIDE: B Natriuretic Peptide: 321.5 pg/mL — ABNORMAL HIGH (ref 0.0–100.0)

## 2020-12-12 LAB — HEPATIC FUNCTION PANEL
ALT: 34 U/L (ref 0–44)
AST: 26 U/L (ref 15–41)
Albumin: 4.2 g/dL (ref 3.5–5.0)
Alkaline Phosphatase: 66 U/L (ref 38–126)
Bilirubin, Direct: 0.3 mg/dL — ABNORMAL HIGH (ref 0.0–0.2)
Indirect Bilirubin: 1.2 mg/dL — ABNORMAL HIGH (ref 0.3–0.9)
Total Bilirubin: 1.5 mg/dL — ABNORMAL HIGH (ref 0.3–1.2)
Total Protein: 6.7 g/dL (ref 6.5–8.1)

## 2020-12-12 LAB — HEMOGLOBIN A1C
Hgb A1c MFr Bld: 7.2 % — ABNORMAL HIGH (ref 4.8–5.6)
Mean Plasma Glucose: 159.94 mg/dL

## 2020-12-12 LAB — PHOSPHORUS: Phosphorus: 3.5 mg/dL (ref 2.5–4.6)

## 2020-12-12 LAB — CBG MONITORING, ED: Glucose-Capillary: 182 mg/dL — ABNORMAL HIGH (ref 70–99)

## 2020-12-12 LAB — GLUCOSE, CAPILLARY: Glucose-Capillary: 89 mg/dL (ref 70–99)

## 2020-12-12 LAB — DIGOXIN LEVEL: Digoxin Level: <0.2 ng/mL — ABNORMAL LOW (ref 0.8–2.0)

## 2020-12-12 LAB — LIPASE, BLOOD: Lipase: 31 U/L (ref 11–51)

## 2020-12-12 MED ORDER — SODIUM CHLORIDE 0.9% FLUSH
3.0000 mL | Freq: Two times a day (BID) | INTRAVENOUS | Status: DC
Start: 2020-12-12 — End: 2020-12-14
  Administered 2020-12-14: 3 mL via INTRAVENOUS

## 2020-12-12 MED ORDER — NITROGLYCERIN 0.4 MG SL SUBL: 0.4000 mg | SUBLINGUAL_TABLET | SUBLINGUAL | Status: DC | PRN

## 2020-12-12 MED ORDER — SODIUM CHLORIDE 0.9% FLUSH: 3.0000 mL | INTRAVENOUS | Status: DC | PRN

## 2020-12-12 MED ORDER — HEPARIN (PORCINE) 25000 UT/250ML-% IV SOLN
1400.0000 [IU]/h | INTRAVENOUS | Status: DC
  Administered 2020-12-12: 20:00:00 1150 [IU]/h via INTRAVENOUS
  Administered 2020-12-13: 1400 [IU]/h via INTRAVENOUS
  Filled 2020-12-12 (×3): qty 250

## 2020-12-12 MED ORDER — ASPIRIN EC 81 MG PO TBEC
81.0000 mg | DELAYED_RELEASE_TABLET | Freq: Every day | ORAL | Status: DC
  Administered 2020-12-13: 10:00:00 81 mg via ORAL
  Filled 2020-12-12: qty 1

## 2020-12-12 MED ORDER — ADULT MULTIVITAMIN W/MINERALS CH
1.0000 | ORAL_TABLET | Freq: Every day | ORAL | Status: DC
  Administered 2020-12-12 – 2020-12-13 (×2): 1 via ORAL
  Filled 2020-12-12 (×2): qty 1

## 2020-12-12 MED ORDER — ONDANSETRON HCL 4 MG/2ML IJ SOLN: 4.0000 mg | Freq: Four times a day (QID) | INTRAMUSCULAR | Status: DC | PRN

## 2020-12-12 MED ORDER — FOLIC ACID 1 MG PO TABS
1.0000 mg | ORAL_TABLET | Freq: Every day | ORAL | Status: DC
  Administered 2020-12-12 – 2020-12-13 (×2): 1 mg via ORAL
  Filled 2020-12-12 (×2): qty 1

## 2020-12-12 MED ORDER — AMLODIPINE BESYLATE 2.5 MG PO TABS
2.5000 mg | ORAL_TABLET | Freq: Every day | ORAL | Status: DC
  Administered 2020-12-13: 2.5 mg via ORAL
  Filled 2020-12-12: qty 1

## 2020-12-12 MED ORDER — MUSCLE RUB 10-15 % EX CREA
1.0000 "application " | TOPICAL_CREAM | Freq: Two times a day (BID) | CUTANEOUS | Status: DC | PRN
  Filled 2020-12-12: qty 85

## 2020-12-12 MED ORDER — INSULIN ASPART 100 UNIT/ML ~~LOC~~ SOLN
0.0000 [IU] | Freq: Three times a day (TID) | SUBCUTANEOUS | Status: DC
  Administered 2020-12-12 – 2020-12-13 (×2): 2 [IU] via SUBCUTANEOUS
  Administered 2020-12-14: 1 [IU] via SUBCUTANEOUS

## 2020-12-12 MED ORDER — ACETAMINOPHEN 325 MG PO TABS: 650.0000 mg | ORAL_TABLET | ORAL | Status: DC | PRN

## 2020-12-12 MED ORDER — PRAVASTATIN SODIUM 40 MG PO TABS
40.0000 mg | ORAL_TABLET | Freq: Every day | ORAL | Status: DC
  Administered 2020-12-12 – 2020-12-13 (×2): 40 mg via ORAL
  Filled 2020-12-12 (×2): qty 1

## 2020-12-12 MED ORDER — SODIUM CHLORIDE 0.9 % IV SOLN: 250.0000 mL | INTRAVENOUS | Status: DC | PRN

## 2020-12-12 MED ORDER — ASPIRIN 81 MG PO CHEW
324.0000 mg | CHEWABLE_TABLET | Freq: Once | ORAL | Status: AC
  Administered 2020-12-12: 324 mg via ORAL
  Filled 2020-12-12: qty 4

## 2020-12-12 MED ORDER — LORATADINE 10 MG PO TABS: 10.0000 mg | ORAL_TABLET | Freq: Every day | ORAL | Status: DC | PRN

## 2020-12-12 NOTE — Progress Notes (Signed)
Pt told Dr. Claiborne Billings he also has OSA and uses CPAP. Will order QHS.

## 2020-12-12 NOTE — H&P (Addendum)
Cardiology Admission History and Physical:   Patient ID: Edward Mcdaniel MRN: [id number]; DOB: December 18, 1935   Admission date: 12/12/2020  PCP:  Patient, No Pcp Per   [place]  Cardiologist:  [facility] Advanced Practice Provider:  No care team member to display Electrophysiologist:  None  [id number]} Chief Complaint:  Chest pain  Patient Profile:   Edward Mcdaniel is a 85 y.o. male with persistent atrial fibrillation, recently diagnosed LV dysfunction (EF 40-45%), habitual ETOH use, former tobacco use, DM, HTN, HLD, prior SCC of the skin whom we are asked to see by Dr. [name] for exertional dyspnea and chest pain.  History of Present Illness:   Edward Mcdaniel has some concept of his prior cardiac history but information is best obtained through the note he brought in from the [place] and he does not know what medicines he is taking. I've reviewed the list he references in the [place] note and pharmacy is awaiting clarification from the [place]. He has no prior history of CAD. He has had atrial fibrillation since 2017 and failed multiple cardioversions as well as flecainide due to QRS widening. He was on amiodarone up until July 2021 when it was discontinued due to potential toxicity. He experienced recurrent symptomatic atrial fib and amiodarone was restarted (200mg  BID x 7 days then down to 1 tablet daily - patient unsure when he down-titrated this dose). He subsequently underwent DCCV 10/12/20 but reverted to AF 3 days later. He saw Ileene Hutchinson EP PA in consultation at the [place] who discussed options with him and he was pending referral to [facility] for ablation. Prior echo 2017 was reported to have shown EF >55%, mild AI/MR, mild posterior MVP. 2D echocardiogram was updated 11/14/20 showing EF 40-45%, mild LVH, severely reduced RV function, mild AI, mild-moderate MR, mild TR, mildly dilated ascending aorta. He was started on Toprol (dose unknown by patient at this time, recalls a "half  pill" recently). He subsequently underwent nuclear stress test showing no reversible ischemia, suspect small infarct at cardiac apex, diminshed EF 48%, elevated EDP.  He was continuing to have some generalized fatigue and DOE therefore was referred for [facility] for more definitive ischemic evaluation. This was pending this week.   Overnight around 2am he awoke with vague substernal chest pain. It was fairly persistent, not made worse or better by anything in particular, and so he went onto his [facility] appointment for labs this AM. Do not have access to those reports. Due to persistent symptoms he came to the ER for evaluation. Chest pain has spontaneously improved but he can still feel some discomfort. He is noted to have HR in the 29-45 range while in the ED. Aside from mild vague chest discomfort, he is completely asymptomatic from a bradycardia standpoint without dizziness, pre-syncope or recent syncope. He has told myself and the nurse he "always runs this low" but in talking with him further (and [facility]'s recent note), his HR was previously in the 50s-60s. Chest pain does not worsen with inspiration, exertion, palpation or any other measures. He states he overall feels OK right now. Labs notable for hsTroponin 32->32, Cr 1.31 without prior baseline, CBC wnl. CXR with clear lungs, heart upper normal in size and aortic atherosclerosis. Last took Eliquis this morning. [facility] indicates 3-4 beers weekly, patient today reports 1-2 shots of whiskey every few days. Otherwise no recent acute symptoms. Denies any accidental overingestion of medicines.   Med List Per [facility] Note References Toprol  being added, dose not known at this time (patient recalls taking 1/2 tablet of something recently) Amiodarone 200mg  2 tabs BID x 7 days then 1 tab daily - patient is now doing 1 tablet daily but unsure of when he made that switch Amlodipine 2.5mg  daily Apixaban 5mg  BID Carboxymethocellulose 0.28ml 1 drop each eye at  bedtime HCTZ 12.5mg  daily Lisinopril 40mg  daily Loratidine 10mg  daily PRN Menthol salicylate 61-95% topical cream affected area as neded for joint pain BID, uses on back Metformin 500mg  24hr tab daily Pravastatin 40mg  daily QPM Systane balance 1 drop both eyes 4x a day prn Tamsulosin 0.4mg  daily QHS   Past Medical History:  Diagnosis Date  . Allergic rhinitis   . Asthma   . DM type 2 (diabetes mellitus, type 2) (HCC)    diet controlled  . HTN (hypertension)   . Hyperlipidemia   . LV dysfunction   . Persistent atrial fibrillation ([place])   . Squamous cell carcinoma of skin 10/02/2011   right arm - tx p bx  . Squamous cell carcinoma of skin 01/03/2014   in situ on right cheek - tx p bx  . Squamous cell carcinoma of skin 07/08/2017   in situ on left hand - tx p bx  . Squamous cell carcinoma of skin 03/25/2018   well differentiated on top of left hand - tx p bx  . Squamous cell carcinoma of skin 02/15/2020   well differentiated on left zygomatic area cx3, excision  . Squamous cell carcinoma of skin 02/15/2020   in situ on right mid antihelix cx3 71fu    Past Surgical History:  Procedure Laterality Date  . HERNIA REPAIR     x 2  . KNEE SURGERY     bilateral  . RECTAL SURGERY    . shoulder bone spur / tendon repair       Medications Prior to Admission: See above Prior list in our Epic is not accurate Pending final clarification   Allergies:   No Known Allergies  Social History:   Social History   Socioeconomic History  . Marital status: Widowed    Spouse name: Not on file  . Number of children: Not on file  . Years of education: Not on file  . Highest education level: Not on file  Occupational History  . Occupation: retired > [employer]   Tobacco Use  . Smoking status: Former Smoker    Packs/day: 1.50    Years: 30.00    Pack years: 45.00    Types: Cigarettes    Quit date: 10/08/1987    Years since quitting: 33.2  . Smokeless tobacco: Former Systems developer    Types:  Chew  Substance and Sexual Activity  . Alcohol use: Yes    Alcohol/week: 0.0 standard drinks    Comment: 1-2 shots of whiskey a few days a week  . Drug use: Never  . Sexual activity: Not on file  Other Topics Concern  . Not on file  Social History Narrative   veteran   Social Determinants of Health   Financial Resource Strain: Not on file  Food Insecurity: Not on file  Transportation Needs: Not on file  Physical Activity: Not on file  Stress: Not on file  Social Connections: Not on file  Intimate Partner Violence: Not on file    Family History:   The patient's family history includes Heart disease in his brother and mother; Lung cancer in his mother.    ROS:  Please see the history  of present illness.  All other ROS reviewed and negative.     Physical Exam/Data:   Vitals:   12/12/20 1400 12/12/20 1445 12/12/20 1500 12/12/20 1545  BP: (!) 147/73 (!) 143/86 (!) 179/94 (!) 147/85  Pulse: (!) 42 (!) 31 (!) 53 (!) 36  Resp: 15 20 (!) 22 11  Temp:      SpO2: 98% 100% 99% 96%   No intake or output data in the 24 hours ending 12/12/20 1636 Last 3 Weights 03/19/2016 03/20/2015 02/06/2015  Weight (lbs) 192 lb 195 lb 185 lb  Weight (kg) 87.091 kg 88.451 kg 83.915 kg     There is no height or weight on file to calculate BMI.  Vital Signs. BP (!) 147/85   Pulse (!) 36   Temp 97.8 F (36.6 C)   Resp 11   SpO2 96%  General: Well developed, well nourished WM, in no acute distress. Jovial, in good spirits Head: Normocephalic, atraumatic, sclera non-icteric, no xanthomas, nares are without discharge. Neck: Negative for carotid bruits. JVP not elevated. Lungs: Coarse BS at bases without wheezes, rales, or rhonchi. Breathing is unlabored. Heart: Irregularly irregular, rate bradycardic, S1 S2 without murmurs, rubs, or gallops.  Abdomen: Soft, non-tender, non-distended with normoactive bowel sounds. No rebound/guarding. Extremities: No clubbing or cyanosis. No edema. Distal pedal  pulses are 2+ and equal bilaterally. Neuro: Alert and oriented X 3. Moves all extremities spontaneously. Psych:  Responds to questions appropriately with a normal affect.  EKG:  The ECG that was done today was personally reviewed and demonstrates atrial fib 59bpm, NSIVCD, cannot exclude prior septal and inferior infarct, nonspecific ST/TW changes without prior to compare to  Relevant CV Studies: N/A  Laboratory Data:  High Sensitivity Troponin:   Recent Labs  Lab 12/12/20 1011 12/12/20 1209  TROPONINIHS 32* 32*      Chemistry Recent Labs  Lab 12/12/20 1011  NA 136  K 3.9  CL 101  CO2 27  GLUCOSE 172*  BUN 17  CREATININE 1.31*  CALCIUM 9.3  GFRNONAA 54*  ANIONGAP 8    Recent Labs  Lab 12/12/20 1011  PROT 6.7  ALBUMIN 4.2  AST 26  ALT 34  ALKPHOS 66  BILITOT 1.5*   Hematology Recent Labs  Lab 12/12/20 1011  WBC 6.0  RBC 4.51  HGB 14.6  HCT 45.1  MCV 100.0  MCH 32.4  MCHC 32.4  RDW 14.7  PLT 153   BNPNo results for input(s): BNP, PROBNP in the last 168 hours.  DDimer No results for input(s): DDIMER in the last 168 hours.   Radiology/Studies:  DG Chest 2 View  Result Date: 12/12/2020 CLINICAL DATA:  Chest pain and shortness of breath EXAM: CHEST - 2 VIEW COMPARISON:  March 22, 2012 FINDINGS: Lungs are clear. Heart is upper normal in size with pulmonary vascularity normal. There is aortic atherosclerosis. No adenopathy. No bone lesions. IMPRESSION: Lungs clear. Heart upper normal in size. Aortic Atherosclerosis (ICD10-I70.0). Electronically Signed   By: Lowella Grip III M.D.   On: 12/12/2020 11:10     Assessment and Plan:   1. Chest pain with recent progressive fatigue and dyspnea concerning for unstable angina - vague persistent discomfort with low-level troponin elevation, recently pending outpatient cath at the [facility] - now presenting with new onset bradycardia of unclear duration - HR 50s-60s prior to initiation of Toprol (recently started advance  guideline directed medical therapy for recently diagnosed LV dysfunction) - will hold further Eliquis and start heparin per pharmacy -  give 324mg  ASA today then 81mg  daily thereafter - see below regarding statin - hold beta blocker given bradycardia - anticipate cardiac cath on Thursday given last Eliquis dose this morning - plans/orders will need to be finalized tomorrow, but tentatively put on the bord for LCP that day  2. Persistent atrial fib, here with slow ventricular response HR primarily 30s-low 50s - no urgent indication for PPM, remaining hemodynamically stable and neurologically asymptomatic, but will need close monitoring to ensure HR improves with discontinuation of AV nodal blocking agents. If it does not will need EP consultation while inpatient - hold amiodarone and metoprolol, pharmacy to further clarify home med list to identify any additional offending agents - hold Eliquis as above - check TSH and digoxin level in case he is taking this unbeknownst to [place] - bedrest for now as a precaution, can advance activity as HR improves  3. Recently diagnosed cardiomyopathy of uncertain etiology - volume status looks OK - hold BB, ACEi acutely - ddx includes afib, CAD, ETOH - anticipate need to proceed with cath this admission - hold off repeat echo given recent assessment  3. Essential HTN - per d/w MD, will hold antihypertensives to allow permissive HTN while bradycardic except amlodipine given antianginal effect - will need to review clarified home med list in AM  4. Renal insufficiency of unknown chronicity - hold HCTZ/lisinopril for now and follow  5. NIDDM - hold metformin and add SSI  6. Hyperlipidemia - continue pravastatin and check FLP in AM - follow LFTs given mild elevation  7. Habitual ETOH use - add folic acid and multivitamin while inpatient - episodic use only reported, but monitor for withdrawal in case he imbibes more than what is reported  PLEASE REVIEW  HOME MED LIST TO DETERMINE IF ANY ADDITIONAL MEDICINES ARE IDENTIFIED THAT NEED RECONCILING.  Code status: full code, confirmed with patient and daughter at bedside  Risk Assessment/Risk Scores:     TIMI Risk Score for Unstable Angina or Non-ST Elevation MI:   The patient's TIMI risk score is 3, which indicates a 13% risk of all cause mortality, new or recurrent myocardial infarction or need for urgent revascularization in the next 14 days.   New York Heart Association (NYHA) Functional Class NYHA Class II-III  CHA2DS2-VASc Score = 5  This indicates a 7.2% annual risk of stroke. The patient's score is based upon: CHF History: No HTN History: Yes Diabetes History: Yes Stroke History: No Vascular Disease History: Yes/aortic athero Age Score: 2 Gender Score: 0      Severity of Illness: The appropriate patient status for this patient is INPATIENT. Inpatient status is judged to be reasonable and necessary in order to provide the required intensity of service to ensure the patient's safety. The patient's presenting symptoms, physical exam findings, and initial radiographic and laboratory data in the context of their chronic comorbidities is felt to place them at high risk for further clinical deterioration. Furthermore, it is not anticipated that the patient will be medically stable for discharge from the hospital within 2 midnights of admission. The following factors support the patient status of inpatient.   " The patient's presenting symptoms include chest pain " The worrisome physical exam findings include bradycardia on exam. " The initial radiographic and laboratory data are worrisome because of elevated troponin. " The chronic co-morbidities include DM, afib, HTN, HLD, LV dysfunction.   * I certify that at the point of admission it is my clinical judgment that the patient will  require inpatient hospital care spanning beyond 2 midnights from the point of admission due to high  intensity of service, high risk for further deterioration and high frequency of surveillance required.*    For questions or updates, please contact [place] Please consult [email or web address] for contact info under     Signed, Charlie Pitter, PA-C  12/12/2020 4:36 PM   Patient seen and examined. Agree with assessment and plan.  Edward Mcdaniel is an 85 year old gentleman who has a history of persistent atrial fibrillation on anticoagulation therapy, diabetes mellitus, hypertension, hyperlipidemia, and has been evaluated at the [facility].  Recent echo Doppler study has shown LV dysfunction with EF at 40 to 45%.  He has a history of atrial fibrillation dating back to 2017 and failed multiple cardioversions as well as flecainide.  He recently developed symptomatic atrial fibrillation and amiodarone was restarted (had been on remotely) and again he underwent attempted cardioversion with ultimate reversion back to atrial fibrillation after transient sinus rhythm for several days.  With a recent echo Doppler study showing EF of 40 to 45% and exertional dyspnea, he apparently underwent a nuclear perfusion study which raise concern for possible apical infarct without reversible ischemia.  The patient has been scheduled to undergo definitive cardiac catheterization next week and has tentative plans to undergo atrial fibrillation ablation at the [facility] in April.  Last evening he was awakened with substernal chest pressure which was new onset.  He has sleep apnea and was wearing his CPAP mask.  This morning he had taken his medications and is not certain specifically as to what medicines he has been on but he did take his Eliquis this morning.  Is only in the emergency room he is pain-free.  He has been found to have slow atrial fibrillation with rates down into the upper 20s and 30s but at present when seen by me atrial fibrillation rate was around 50 -54 but he was standing and sitting.  His daughter  is with him in the emergency room.  On exam, he is in no acute distress.  There is no JVD.  Lungs are relatively clear there is no wheezing.  Rhythm was irregular regular with pulse in the 50s.  There is a 1/6 systolic murmur.  There is no S3 gallop.  Abdomen is soft nontender.  Pulses are adequate.  There is no significant edema.  Neurologic he is grossly nonfocal.  He has normal affect and mood.  Laboratories notable for minimal at bedtime troponin elevation at 32 with a flat plateau.  Glucose was increased at 172.  Creatinine is increased at 1.31 consistent with stage IIIa CKD.  CBC is stable.  ECG shows atrial fibrillation with ventricular rate at 59.  There is a large Q-wave in lead III.  There is incomplete left bundle branch block.  I had a long discussion with the patient and his daughter.  The patient was awakened this morning from sleep with chest pressure worrisome for angina pectoris.  He has been documented to have mild LV dysfunction on echocardiography.  Nuclear study did not reveal definitive ischemia but raise concern for possible apical infarct.  At present, will hold Eliquis.  In light of his slow ventricular rate previously documented earlier today, we will hold amiodarone as well as metoprolol presently.  His blood pressure currently is stable we will continue amlodipine at its present dose.  At present we will hold HCTZ and lisinopril with mild renal dysfunction in preparation for  cardiac catheterization.  I discussed definitive cardiac catheterization while hospitalized.  We will tentatively plan for Thursday a.m. allowing for Eliquis washout.   [name], MD, [facility] 12/12/2020 5:01 PM

## 2020-12-12 NOTE — ED Triage Notes (Signed)
Pt reports this morning around 2am. He started having cp. Pt reports cardiac history. Pt reports he is schedule for cardiac cath next Thursday pt has h/o a-fib

## 2020-12-12 NOTE — Progress Notes (Addendum)
ANTICOAGULATION CONSULT NOTE - Initial Consult  Pharmacy Consult for heparin Indication: chest pain/ACS  No Known Allergies  Patient Measurements:   Heparin Dosing Weight: 88.6 kg  Vital Signs: Temp: 97.8 F (36.6 C) (03/08 1007) BP: 147/85 (03/08 1545) Pulse Rate: 36 (03/08 1545)  Labs: Recent Labs    12/12/20 1011 12/12/20 1209  HGB 14.6  --   HCT 45.1  --   PLT 153  --   CREATININE 1.31*  --   TROPONINIHS 32* 32*    CrCl cannot be calculated (Unknown ideal weight.).   Medical History: Past Medical History:  Diagnosis Date  . Allergic rhinitis   . Asthma   . DM type 2 (diabetes mellitus, type 2) (HCC)    diet controlled  . HTN (hypertension)   . Hyperlipidemia   . LV dysfunction   . Persistent atrial fibrillation ([place])   . Squamous cell carcinoma of skin 10/02/2011   right arm - tx p bx  . Squamous cell carcinoma of skin 01/03/2014   in situ on right cheek - tx p bx  . Squamous cell carcinoma of skin 07/08/2017   in situ on left hand - tx p bx  . Squamous cell carcinoma of skin 03/25/2018   well differentiated on top of left hand - tx p bx  . Squamous cell carcinoma of skin 02/15/2020   well differentiated on left zygomatic area cx3, excision  . Squamous cell carcinoma of skin 02/15/2020   in situ on right mid antihelix cx3 53fu    Assessment: 56 YOM admitted with chest pain with history of persistent atrial fibrillation on Eliquis PTA (last dose 3/8 at 0700). Pharmacy has been consulted to dose heparin. Baseline CBC stable/WNL. Planning potential cardiac cath 3/10.   Goal of Therapy:  aPTT 66-102 seconds Heparin level 0.3-0.7 units/ml Monitor platelets by anticoagulation protocol: Yes   Plan:  Heparin 1150 units/hr, no bolus 8 hour aPTT Daily aPTT/heparin level, CBC  Romilda Garret, PharmD PGY1 Acute Care Pharmacy Resident 12/12/2020 4:24 PM  Please check [email or web address] for unit specific pharmacy phone numbers.

## 2020-12-12 NOTE — ED Provider Notes (Signed)
[facility] EMERGENCY DEPARTMENT Provider Note   CSN: [id number] Arrival date & time: 12/12/20  3664     History No chief complaint on file.   Edward Mcdaniel is a 85 y.o. male.  HPI Patient reports he awakened at 2 AM with a combination pressure and tightness in the center of his chest.  It persisted through most of the night.  He reports he might of felt slightly short of breath but not noticeably or persistently so.  No radiation to jaws or arms.  No nausea or vomiting.  Patient went to the [place] this morning to do screening lab work and decide to stop at urgent care for evaluation of his chest pain.  He was referred to come to the emergency department.  He has chronic atrial fibrillation and takes amiodarone and Eliquis.  Reports he had a stress test in the past couple weeks.  [place]  He reports that he is scheduled to get a cardiac catheterization in a week.  Has had several cardioversions in the past which only resolved his A. fib for few days.  He describes that he is scheduled to get another cardioversion and a catheterization.  He has medical paperwork from the [place] that indicates plan for catheterization and revascularization if indicated.    Past Medical History:  Diagnosis Date  . Allergic rhinitis   . Asthma   . DM type 2 (diabetes mellitus, type 2) (HCC)    diet controlled  . HTN (hypertension)   . Hyperlipidemia   . Squamous cell carcinoma of skin 10/02/2011   right arm - tx p bx  . Squamous cell carcinoma of skin 01/03/2014   in situ on right cheek - tx p bx  . Squamous cell carcinoma of skin 07/08/2017   in situ on left hand - tx p bx  . Squamous cell carcinoma of skin 03/25/2018   well differentiated on top of left hand - tx p bx  . Squamous cell carcinoma of skin 02/15/2020   well differentiated on left zygomatic area cx3, excision  . Squamous cell carcinoma of skin 02/15/2020   in situ on  right mid antihelix cx3 69fu    Patient Active Problem List   Diagnosis Date Noted  . HYPERLIPIDEMIA 02/16/2009  . DIABETES, TYPE 2 11/03/2007  . HYPERCHOLESTEROLEMIA 11/03/2007  . HYPERTENSION 11/03/2007  . Seasonal and perennial allergic rhinitis 11/03/2007  . Asthma, mild intermittent 11/03/2007    Past Surgical History:  Procedure Laterality Date  . HERNIA REPAIR     x 2  . KNEE SURGERY     bilateral  . RECTAL SURGERY    . shoulder bone spur / tendon repair         Family History  Problem Relation Age of Onset  . Lung cancer Mother        smoker    Social History   Tobacco Use  . Smoking status: Former Smoker    Packs/day: 1.50    Years: 30.00    Pack years: 45.00    Types: Cigarettes    Quit date: 10/08/1987    Years since quitting: 33.2  . Smokeless tobacco: Former Systems developer    Types: Chew  Substance Use Topics  . Alcohol use: Yes    Alcohol/week: 0.0 standard drinks  . Drug use: Never    Home Medications Prior to Admission medications   Medication Sig Start Date End Date Taking? Authorizing  Provider  albuterol (PROAIR HFA) 108 (90 Base) MCG/ACT inhaler Inhale 2 puffs into the lungs every 6 (six) hours as needed for wheezing or shortness of breath. 03/19/16 04/10/20  Baird Lyons D, MD  aspirin 81 MG tablet Take 81 mg by mouth daily.      [provider]  carvedilol (COREG) 3.125 MG tablet Take 3.125 mg by mouth 2 (two) times daily with a meal.     [provider]  hydrochlorothiazide 25 MG tablet Take 1 tablet by mouth Daily. 01/01/11   [provider]  lisinopril (PRINIVIL,ZESTRIL) 20 MG tablet Take 20 mg by mouth daily.    [provider]  loratadine (CLARITIN) 10 MG tablet Take 1 tablet (10 mg total) by mouth daily as needed. 03/19/16 04/10/20  Baird Lyons D, MD  lovastatin (MEVACOR) 40 MG tablet Take 40 mg by mouth at bedtime.    [provider]  metFORMIN (GLUCOPHAGE-XR) 750 MG 24 hr tablet Take 750 mg by mouth  daily with breakfast.    [provider]  NON FORMULARY Allergy vaccines 1:10 weekly GO    [provider]  triamcinolone cream (KENALOG) 0.1 % As directed    [provider]    Allergies    Patient has no known allergies.  Review of Systems   Review of Systems 10 systems reviewed negative except as per HPI Physical Exam Updated Vital Signs BP (!) 147/73   Pulse (!) 42   Temp 97.8 F (36.6 C)   Resp 15   SpO2 98%   Physical Exam Constitutional:      Comments: Alert nontoxic.  Clinically well in appearance.  HENT:     Head: Normocephalic and atraumatic.     Mouth/Throat:     Pharynx: Oropharynx is clear.  Eyes:     Extraocular Movements: Extraocular movements intact.  Cardiovascular:     Comments: Bradycardia.  Irregularly irregular. Pulmonary:     Effort: Pulmonary effort is normal.     Breath sounds: Normal breath sounds.  Abdominal:     General: There is no distension.     Palpations: Abdomen is soft.     Tenderness: There is no abdominal tenderness. There is no guarding.  Musculoskeletal:        General: No swelling or tenderness. Normal range of motion.     Right lower leg: No edema.     Left lower leg: No edema.  Skin:    General: Skin is warm and dry.  Neurological:     General: No focal deficit present.     Mental Status: He is oriented to person, place, and time.     Motor: No weakness.     Coordination: Coordination normal.  Psychiatric:        Mood and Affect: Mood normal.     ED Results / Procedures / Treatments   Labs (all labs ordered are listed, but only abnormal results are displayed) Labs Reviewed  BASIC METABOLIC PANEL - Abnormal; Notable for the following components:      Result Value   Glucose, Bld 172 (*)    Creatinine, Ser 1.31 (*)    GFR, Estimated 54 (*)    All other components within normal limits  HEPATIC FUNCTION PANEL - Abnormal; Notable for the following components:   Total Bilirubin 1.5 (*)     Bilirubin, Direct 0.3 (*)    Indirect Bilirubin 1.2 (*)    All other components within normal limits  TROPONIN I (HIGH SENSITIVITY) -  Abnormal; Notable for the following components:   Troponin I (High Sensitivity) 32 (*)    All other components within normal limits  TROPONIN I (HIGH SENSITIVITY) - Abnormal; Notable for the following components:   Troponin I (High Sensitivity) 32 (*)    All other components within normal limits  CBC  LIPASE, BLOOD  MAGNESIUM  PHOSPHORUS    EKG EKG Interpretation  Date/Time:  Tuesday December 12 2020 10:02:29 EST Ventricular Rate:  59 PR Interval:    QRS Duration: 132 QT Interval:  442 QTC Calculation: 437 R Axis:   5 Text Interpretation: Atrial fibrillation with slow ventricular response Non-specific intra-ventricular conduction block Cannot rule out Septal infarct , age undetermined T wave abnormality, consider lateral ischemia Abnormal ECG agree, poor r wave progressio, inferior q wave  and lateral ST depression compared to old.  no STEMI Confirmed by Charlesetta Shanks [phone]) on 12/12/2020 10:35:20 AM   Radiology DG Chest 2 View  Result Date: 12/12/2020 CLINICAL DATA:  Chest pain and shortness of breath EXAM: CHEST - 2 VIEW COMPARISON:  March 22, 2012 FINDINGS: Lungs are clear. Heart is upper normal in size with pulmonary vascularity normal. There is aortic atherosclerosis. No adenopathy. No bone lesions. IMPRESSION: Lungs clear. Heart upper normal in size. Aortic Atherosclerosis (ICD10-I70.0). Electronically Signed   By: Lowella Grip III M.D.   On: 12/12/2020 11:10    Procedures Procedures  No critical care time Medications Ordered in ED Medications - No data to display  ED Course  I have reviewed the triage vital signs and the nursing notes.  Pertinent labs & imaging results that were available during my care of the patient were reviewed by me and considered in my medical decision making (see chart for details).  Clinical Course as of  12/12/20 1440  Tue Dec 12, 2020  1439 Consult:cardiology reviewed with Wannetta Sender for ED evaluation. [MP]    Clinical Course User Index [MP] Charlesetta Shanks, MD   MDM Rules/Calculators/A&P                          Patient presents as outlined above.  At this time we will proceed with cardiac work-up for suspected ischemic chest pain.  Patient takes Eliquis and amiodarone regularly.  Heart rate is in the 40s with atrial fibrillation.  This appears to be baseline.  Blood pressures are stable.  Heart rate remains bradycardic.  Rate in the 30s to low 40s at rest.  Patient has been chronically taking amiodarone.  Troponins are flat at 32.  Patient does describe increasing anginal symptoms.  First having significant exertional dyspnea with normal activity and now central chest pain awaking him at night.  Final disposition per cardiology consult.  Final Clinical Impression(s) / ED Diagnoses Final diagnoses:  Atrial fibrillation with slow ventricular response ([place])  Angina at rest [facility])    Rx / DC Orders ED Discharge Orders    None       Charlesetta Shanks, MD 12/12/20 1442

## 2020-12-13 DIAGNOSIS — I4821 Permanent atrial fibrillation: Secondary | ICD-10-CM | POA: Diagnosis not present

## 2020-12-13 DIAGNOSIS — N1831 Chronic kidney disease, stage 3a: Secondary | ICD-10-CM | POA: Diagnosis not present

## 2020-12-13 DIAGNOSIS — I25110 Atherosclerotic heart disease of native coronary artery with unstable angina pectoris: Secondary | ICD-10-CM | POA: Diagnosis not present

## 2020-12-13 DIAGNOSIS — I429 Cardiomyopathy, unspecified: Secondary | ICD-10-CM | POA: Diagnosis not present

## 2020-12-13 LAB — HEPATIC FUNCTION PANEL
ALT: 29 U/L (ref 0–44)
AST: 20 U/L (ref 15–41)
Albumin: 3.6 g/dL (ref 3.5–5.0)
Alkaline Phosphatase: 51 U/L (ref 38–126)
Bilirubin, Direct: 0.3 mg/dL — ABNORMAL HIGH (ref 0.0–0.2)
Indirect Bilirubin: 1.4 mg/dL — ABNORMAL HIGH (ref 0.3–0.9)
Total Bilirubin: 1.7 mg/dL — ABNORMAL HIGH (ref 0.3–1.2)
Total Protein: 5.9 g/dL — ABNORMAL LOW (ref 6.5–8.1)

## 2020-12-13 LAB — CBC
HCT: 41.5 % (ref 39.0–52.0)
Hemoglobin: 13.9 g/dL (ref 13.0–17.0)
MCH: 32.3 pg (ref 26.0–34.0)
MCHC: 33.5 g/dL (ref 30.0–36.0)
MCV: 96.5 fL (ref 80.0–100.0)
Platelets: 126 10*3/uL — ABNORMAL LOW (ref 150–400)
RBC: 4.3 MIL/uL (ref 4.22–5.81)
RDW: 14.6 % (ref 11.5–15.5)
WBC: 6.7 10*3/uL (ref 4.0–10.5)
nRBC: 0 % (ref 0.0–0.2)

## 2020-12-13 LAB — LIPID PANEL
Cholesterol: 103 mg/dL (ref 0–200)
HDL: 46 mg/dL (ref >40)
LDL Cholesterol: 51 mg/dL (ref 0–99)
Total CHOL/HDL Ratio: 2.2 RATIO
Triglycerides: 31 mg/dL (ref <150)
VLDL: 6 mg/dL (ref 0–40)

## 2020-12-13 LAB — BASIC METABOLIC PANEL
Anion gap: 12 (ref 5–15)
BUN: 18 mg/dL (ref 8–23)
CO2: 22 mmol/L (ref 22–32)
Calcium: 9.2 mg/dL (ref 8.9–10.3)
Chloride: 103 mmol/L (ref 98–111)
Creatinine, Ser: 1.21 mg/dL (ref 0.61–1.24)
GFR, Estimated: 60 mL/min — ABNORMAL LOW (ref >60)
Glucose, Bld: 107 mg/dL — ABNORMAL HIGH (ref 70–99)
Potassium: 4 mmol/L (ref 3.5–5.1)
Sodium: 137 mmol/L (ref 135–145)

## 2020-12-13 LAB — GLUCOSE, CAPILLARY
Glucose-Capillary: 188 mg/dL — ABNORMAL HIGH (ref 70–99)
Glucose-Capillary: 113 mg/dL — ABNORMAL HIGH (ref 70–99)
Glucose-Capillary: 120 mg/dL — ABNORMAL HIGH (ref 70–99)
Glucose-Capillary: 138 mg/dL — ABNORMAL HIGH (ref 70–99)

## 2020-12-13 LAB — APTT
aPTT: 48 seconds — ABNORMAL HIGH (ref 24–36)
aPTT: 67 seconds — ABNORMAL HIGH (ref 24–36)

## 2020-12-13 LAB — SARS CORONAVIRUS 2 (TAT 6-24 HRS): SARS Coronavirus 2: NEGATIVE

## 2020-12-13 MED ORDER — SODIUM CHLORIDE 0.9% FLUSH
3.0000 mL | Freq: Two times a day (BID) | INTRAVENOUS | Status: DC
  Administered 2020-12-14: 3 mL via INTRAVENOUS

## 2020-12-13 MED ORDER — SODIUM CHLORIDE 0.9 % WEIGHT BASED INFUSION
3.0000 mL/kg/h | INTRAVENOUS | Status: DC
  Administered 2020-12-14: 3 mL/kg/h via INTRAVENOUS

## 2020-12-13 MED ORDER — SODIUM CHLORIDE 0.9% FLUSH: 3.0000 mL | INTRAVENOUS | Status: DC | PRN

## 2020-12-13 MED ORDER — ANGIOPLASTY BOOK
Freq: Once | Status: AC
  Filled 2020-12-13: qty 1

## 2020-12-13 MED ORDER — SODIUM CHLORIDE 0.9 % IV SOLN: 250.0000 mL | INTRAVENOUS | Status: DC | PRN

## 2020-12-13 MED ORDER — ASPIRIN 81 MG PO CHEW
81.0000 mg | CHEWABLE_TABLET | ORAL | Status: AC
  Administered 2020-12-14: 81 mg via ORAL
  Filled 2020-12-13: qty 1

## 2020-12-13 MED ORDER — SODIUM CHLORIDE 0.9 % WEIGHT BASED INFUSION
1.0000 mL/kg/h | INTRAVENOUS | Status: DC
  Administered 2020-12-14: 1 mL/kg/h via INTRAVENOUS

## 2020-12-13 NOTE — Progress Notes (Addendum)
Received update from nurse regarding pause, reported by tele as 3.96sec. Appears this was a 2.7sec pause followed by a junctional beat, then resumption of usual atrial fib with slow ventricular response. His HR currently remains similar to yesterday, most recently ranging from 39-upper 9s. He is totally asymptomatic like he was in the ED and VSS. He is undergoing washout of his amiodarone and metoprolol. Updated Dr. Claiborne Billings - per our discussion continue current washout, avoid AVN blocking agents, hold amlodipine as a precaution and continue to monitor for any development of symptoms. Nurse aware to notify for any clinical changse. Per our discussion we'll send a message to our cardmaster inbox to have EP see tomorrow for anticipatory guidance in the event that bradycardia persists. (With long amiodarone half life, this may be a possibility.)  Addendum 8:06 PM  Patient having similar events this evening as above with episodic bradycardia/pauses between 2-3 sec followed by junctional escape beat then reversion to continued atrial fib with SVR HR 39-upper 50s. Blood pressure remains stable. I evaluated patient at bedside this evening. He is well appearing, warm extremities, totally asymptomatic, surprised to hear his heart is doing anything unusual. Blood pressure is stable. Reviewed with Dr. Harl Bowie who agrees to continue clinical surveillance without any new intervention, but nursing to notify for any new symptoms or change in clinical status. Talley Casco PA-C

## 2020-12-13 NOTE — Progress Notes (Signed)
ANTICOAGULATION CONSULT NOTE - Follow Up Consult  Pharmacy Consult for heparin Indication: CP and Afib  Labs: Recent Labs    12/12/20 1011 12/12/20 1209 12/12/20 1749 12/13/20 0248  HGB 14.6  --   --  13.9  HCT 45.1  --   --  41.5  PLT 153  --   --  126*  APTT  --   --   --  48*  CREATININE 1.31*  --   --  1.21  TROPONINIHS 32* 32* 36*  --     Assessment: 84yo male subtherapeutic on heparin with initial dosing while Eliquis on hold; no gtt issues per RN though she notes that when pt lost an IV the site bled x36min, Hgb stable.  Goal of Therapy:  Heparin level 0.3-0.7 units/ml   Plan:  Will increase heparin gtt by 3 units/kg/hr to 1400 units/hr and check PTT in 8 hours.    Wynona Neat, PharmD, BCPS  12/13/2020,5:11 AM

## 2020-12-13 NOTE — Progress Notes (Addendum)
Progress Note  Patient Name: Edward Mcdaniel Date of Encounter: 12/13/2020  [facility] Cardiologist: No primary care provider on file.   Subjective   Feeling well this morning  Inpatient Medications    Scheduled Meds: . amLODipine  2.5 mg Oral Daily  . aspirin EC  81 mg Oral Daily  . folic acid  1 mg Oral Daily  . insulin aspart  0-9 Units Subcutaneous TID WC  . multivitamin with minerals  1 tablet Oral Daily  . pravastatin  40 mg Oral q1800  . sodium chloride flush  3 mL Intravenous Q12H   Continuous Infusions: . sodium chloride    . heparin 1,400 Units/hr (12/13/20 0526)   PRN Meds: sodium chloride, acetaminophen, loratadine, Muscle Rub, nitroGLYCERIN, ondansetron (ZOFRAN) IV, sodium chloride flush   Vital Signs    Vitals:   12/13/20 0438 12/13/20 0500 12/13/20 0528 12/13/20 0754  BP: 130/71  132/74 114/61  Pulse: (!) 43  (!) 49 (!) 42  Resp: 18  16 14   Temp: 97.7 F (36.5 C)  98 F (36.7 C) 98.5 F (36.9 C)  TempSrc: Oral  Oral Oral  SpO2: 94%  97% 95%  Weight:  85.8 kg    Height:  5\' 11"  (1.803 m)      Intake/Output Summary (Last 24 hours) at 12/13/2020 0855 Last data filed at 12/13/2020 0440 Gross per 24 hour  Intake 34.04 ml  Output 300 ml  Net -265.96 ml   Last 3 Weights 12/13/2020 12/12/2020 03/19/2016  Weight (lbs) 189 lb 2.5 oz 189 lb 1.6 oz 192 lb  Weight (kg) 85.8 kg 85.775 kg 87.091 kg      Telemetry    Afib rates 30-50 - Personally Reviewed  ECG    No new tracing this morning  Physical Exam  Pleasant older male GEN: No acute distress.   Neck: No JVD Cardiac: Irreg Irreg, soft systolic murmur, no rubs, or gallops.  Respiratory: Clear to auscultation bilaterally. GI: Soft, nontender, non-distended  MS: No edema; No deformity. Neuro:  Nonfocal  Psych: Normal affect   Labs    High Sensitivity Troponin:   Recent Labs  Lab 12/12/20 1011 12/12/20 1209 12/12/20 1749  TROPONINIHS 32* 32* 36*      Chemistry Recent Labs  Lab  12/12/20 1011 12/13/20 0248  NA 136 137  K 3.9 4.0  CL 101 103  CO2 27 22  GLUCOSE 172* 107*  BUN 17 18  CREATININE 1.31* 1.21  CALCIUM 9.3 9.2  PROT 6.7 5.9*  ALBUMIN 4.2 3.6  AST 26 20  ALT 34 29  ALKPHOS 66 51  BILITOT 1.5* 1.7*  GFRNONAA 54* 60*  ANIONGAP 8 12     Hematology Recent Labs  Lab 12/12/20 1011 12/13/20 0248  WBC 6.0 6.7  RBC 4.51 4.30  HGB 14.6 13.9  HCT 45.1 41.5  MCV 100.0 96.5  MCH 32.4 32.3  MCHC 32.4 33.5  RDW 14.7 14.6  PLT 153 126*    BNP Recent Labs  Lab 12/12/20 1749  BNP 321.5*    Lipid Panel     Component Value Date/Time   CHOL 103 12/13/2020 0248   TRIG 31 12/13/2020 0248   HDL 46 12/13/2020 0248   CHOLHDL 2.2 12/13/2020 0248   VLDL 6 12/13/2020 0248   LDLCALC 51 12/13/2020 0248   DDimer No results for input(s): DDIMER in the last 168 hours.   Radiology    DG Chest 2 View  Result Date: 12/12/2020 CLINICAL DATA:  Chest pain and shortness of breath EXAM: CHEST - 2 VIEW COMPARISON:  March 22, 2012 FINDINGS: Lungs are clear. Heart is upper normal in size with pulmonary vascularity normal. There is aortic atherosclerosis. No adenopathy. No bone lesions. IMPRESSION: Lungs clear. Heart upper normal in size. Aortic Atherosclerosis (ICD10-I70.0). Electronically Signed   By: Lowella Grip III M.D.   On: 12/12/2020 11:10    Cardiac Studies   N/a   Patient Profile     85 y.o. male  with persistent atrial fibrillation, recently diagnosed LV dysfunction (EF 40-45%), habitual ETOH use, former tobacco use, DM, HTN, HLD, prior SCC of the skin whom we are asked to see by Dr. [name] for exertional dyspnea and chest pain.  Assessment & Plan    1. Chest pain with dyspnea/Unstable angina: vague persistent discomfort with low-level troponin elevation, recently pending outpatient cath at the [facility]. Now presenting with new onset bradycardia of unclear duration - HR 50s-60s prior to initiation of Toprol (recently started advance guideline  directed medical therapy for recently diagnosed LV dysfunction). hsTn 32>>36 -- planned for cardiac cath tomorrow after Eliquis washout -- on IV heparin, ASA, statin  2. Persistent atrial fib, here with slow ventricular response HR primarily 30s-low 50s: remains hemodynamically stable with rates in the 30-50 range, No urgent indication for PPM -- amiodarone and metoprolol held on admission -- TSH normal  3. Recently diagnosed cardiomyopathy of uncertain etiology: no volume overload on exam -- holding BB, ACEi acutely  3. Essential HTN: stable currently.  -- BB held -- continue amlodipine 2.5mg  daily   4. Renal insufficiency of unknown chronicity: 1.3>>1.2 -- holding HCTZ/lisinopril for now and follow  5. NIDDM: Hgb A1c 7.2 -- hold metformin  -- on SSI  6. Hyperlipidemia: continue pravastatin  -- LDL 51  7. Habitual ETOH use: started on folic acid and multivitamin while inpatient  For questions or updates, please contact [place] Please consult [email or web address] for contact info under        Signed, Reino Bellis, NP  12/13/2020, 8:55 AM     Patient seen and examined. Agree with assessment and plan.  No recurrent chest pain.  Currently on IV heparin.  Patient's last dose of Eliquis was yesterday morning.  Laboratory reveals atrial fibrillation with ventricular rate around 43-50.  No JVD.  Decreased breath sounds without wheezing.  Rhythm irregular irregular with a slow ventricular response.  1/6 systolic murmur.  Nontender abdomen.  No edema.  Platelets today mildly decreased at 126.  No bleeding.  Amiodarone and metoprolol on hold.  Pressure stable today at 114/65.  Discussed Plan for cardiac catheterization tomorrow with treatment depending upon results.  LDL cholesterol excellent at 51.   [name], MD, [facility] 12/13/2020 10:58 AM

## 2020-12-13 NOTE — Progress Notes (Addendum)
Patient had 3.96 second pause at 1812.  Patient asymptomatic, states he feels great.  Melina Copa, PA paged.  No new orders recieved

## 2020-12-13 NOTE — H&P (View-Only) (Signed)
Progress Note  Patient Name: Edward Mcdaniel Date of Encounter: 12/13/2020  [facility] Cardiologist: No primary care provider on file.   Subjective   Feeling well this morning  Inpatient Medications    Scheduled Meds: . amLODipine  2.5 mg Oral Daily  . aspirin EC  81 mg Oral Daily  . folic acid  1 mg Oral Daily  . insulin aspart  0-9 Units Subcutaneous TID WC  . multivitamin with minerals  1 tablet Oral Daily  . pravastatin  40 mg Oral q1800  . sodium chloride flush  3 mL Intravenous Q12H   Continuous Infusions: . sodium chloride    . heparin 1,400 Units/hr (12/13/20 0526)   PRN Meds: sodium chloride, acetaminophen, loratadine, Muscle Rub, nitroGLYCERIN, ondansetron (ZOFRAN) IV, sodium chloride flush   Vital Signs    Vitals:   12/13/20 0438 12/13/20 0500 12/13/20 0528 12/13/20 0754  BP: 130/71  132/74 114/61  Pulse: (!) 43  (!) 49 (!) 42  Resp: 18  16 14   Temp: 97.7 F (36.5 C)  98 F (36.7 C) 98.5 F (36.9 C)  TempSrc: Oral  Oral Oral  SpO2: 94%  97% 95%  Weight:  85.8 kg    Height:  5\' 11"  (1.803 m)      Intake/Output Summary (Last 24 hours) at 12/13/2020 0855 Last data filed at 12/13/2020 0440 Gross per 24 hour  Intake 34.04 ml  Output 300 ml  Net -265.96 ml   Last 3 Weights 12/13/2020 12/12/2020 03/19/2016  Weight (lbs) 189 lb 2.5 oz 189 lb 1.6 oz 192 lb  Weight (kg) 85.8 kg 85.775 kg 87.091 kg      Telemetry    Afib rates 30-50 - Personally Reviewed  ECG    No new tracing this morning  Physical Exam  Pleasant older male GEN: No acute distress.   Neck: No JVD Cardiac: Irreg Irreg, soft systolic murmur, no rubs, or gallops.  Respiratory: Clear to auscultation bilaterally. GI: Soft, nontender, non-distended  MS: No edema; No deformity. Neuro:  Nonfocal  Psych: Normal affect   Labs    High Sensitivity Troponin:   Recent Labs  Lab 12/12/20 1011 12/12/20 1209 12/12/20 1749  TROPONINIHS 32* 32* 36*      Chemistry Recent Labs  Lab  12/12/20 1011 12/13/20 0248  NA 136 137  K 3.9 4.0  CL 101 103  CO2 27 22  GLUCOSE 172* 107*  BUN 17 18  CREATININE 1.31* 1.21  CALCIUM 9.3 9.2  PROT 6.7 5.9*  ALBUMIN 4.2 3.6  AST 26 20  ALT 34 29  ALKPHOS 66 51  BILITOT 1.5* 1.7*  GFRNONAA 54* 60*  ANIONGAP 8 12     Hematology Recent Labs  Lab 12/12/20 1011 12/13/20 0248  WBC 6.0 6.7  RBC 4.51 4.30  HGB 14.6 13.9  HCT 45.1 41.5  MCV 100.0 96.5  MCH 32.4 32.3  MCHC 32.4 33.5  RDW 14.7 14.6  PLT 153 126*    BNP Recent Labs  Lab 12/12/20 1749  BNP 321.5*    Lipid Panel     Component Value Date/Time   CHOL 103 12/13/2020 0248   TRIG 31 12/13/2020 0248   HDL 46 12/13/2020 0248   CHOLHDL 2.2 12/13/2020 0248   VLDL 6 12/13/2020 0248   LDLCALC 51 12/13/2020 0248   DDimer No results for input(s): DDIMER in the last 168 hours.   Radiology    DG Chest 2 View  Result Date: 12/12/2020 CLINICAL DATA:  Chest pain and shortness of breath EXAM: CHEST - 2 VIEW COMPARISON:  March 22, 2012 FINDINGS: Lungs are clear. Heart is upper normal in size with pulmonary vascularity normal. There is aortic atherosclerosis. No adenopathy. No bone lesions. IMPRESSION: Lungs clear. Heart upper normal in size. Aortic Atherosclerosis (ICD10-I70.0). Electronically Signed   By: Lowella Grip III M.D.   On: 12/12/2020 11:10    Cardiac Studies   N/a   Patient Profile     85 y.o. male  with persistent atrial fibrillation, recently diagnosed LV dysfunction (EF 40-45%), habitual ETOH use, former tobacco use, DM, HTN, HLD, prior SCC of the skin whom we are asked to see by Dr. [name] for exertional dyspnea and chest pain.  Assessment & Plan    1. Chest pain with dyspnea/Unstable angina: vague persistent discomfort with low-level troponin elevation, recently pending outpatient cath at the [facility]. Now presenting with new onset bradycardia of unclear duration - HR 50s-60s prior to initiation of Toprol (recently started advance guideline  directed medical therapy for recently diagnosed LV dysfunction). hsTn 32>>36 -- planned for cardiac cath tomorrow after Eliquis washout -- on IV heparin, ASA, statin  2. Persistent atrial fib, here with slow ventricular response HR primarily 30s-low 50s: remains hemodynamically stable with rates in the 30-50 range, No urgent indication for PPM -- amiodarone and metoprolol held on admission -- TSH normal  3. Recently diagnosed cardiomyopathy of uncertain etiology: no volume overload on exam -- holding BB, ACEi acutely  3. Essential HTN: stable currently.  -- BB held -- continue amlodipine 2.5mg  daily   4. Renal insufficiency of unknown chronicity: 1.3>>1.2 -- holding HCTZ/lisinopril for now and follow  5. NIDDM: Hgb A1c 7.2 -- hold metformin  -- on SSI  6. Hyperlipidemia: continue pravastatin  -- LDL 51  7. Habitual ETOH use: started on folic acid and multivitamin while inpatient  For questions or updates, please contact [place] Please consult [email or web address] for contact info under        Signed, Reino Bellis, NP  12/13/2020, 8:55 AM     Patient seen and examined. Agree with assessment and plan.  No recurrent chest pain.  Currently on IV heparin.  Patient's last dose of Eliquis was yesterday morning.  Laboratory reveals atrial fibrillation with ventricular rate around 43-50.  No JVD.  Decreased breath sounds without wheezing.  Rhythm irregular irregular with a slow ventricular response.  1/6 systolic murmur.  Nontender abdomen.  No edema.  Platelets today mildly decreased at 126.  No bleeding.  Amiodarone and metoprolol on hold.  Pressure stable today at 114/65.  Discussed Plan for cardiac catheterization tomorrow with treatment depending upon results.  LDL cholesterol excellent at 51.   [name], MD, [facility] 12/13/2020 10:58 AM

## 2020-12-13 NOTE — Progress Notes (Addendum)
[id number] had 3.79 sec pause at 1909, 3.29 second pause at 1913, 3.18 sec pause at 1917. Patient asymptomatic. BP=129/76 at 1927 Strips saved to Epic by [facility] CMT.  Spoke to [employer], [place], she states she will assess tele and come see patient.

## 2020-12-13 NOTE — Progress Notes (Signed)
ANTICOAGULATION CONSULT NOTE - Follow Up Consult  Pharmacy Consult for IV heparin Indication: chest pain/ACS and Afib  No Known Allergies  Patient Measurements: Height: 5\' 11"  (180.3 cm) Weight: 85.8 kg (189 lb 2.5 oz) IBW/kg (Calculated) : 75.3 Heparin Dosing Weight: 85.8 kg  Vital Signs: Temp: 98.5 F (36.9 C) (03/09 0754) Temp Source: Oral (03/09 0754) BP: 130/59 (03/09 0956) Pulse Rate: 42 (03/09 0754)  Labs: Recent Labs    12/12/20 1011 12/12/20 1209 12/12/20 1749 12/13/20 0248 12/13/20 1213  HGB 14.6  --   --  13.9  --   HCT 45.1  --   --  41.5  --   PLT 153  --   --  126*  --   APTT  --   --   --  48* 67*  CREATININE 1.31*  --   --  1.21  --   TROPONINIHS 32* 32* 36*  --   --     Estimated Creatinine Clearance: 50.1 mL/min (by C-G formula based on SCr of 1.21 mg/dL).   Medications:  Infusions:  . sodium chloride    . heparin 1,400 Units/hr (12/13/20 0526)    Assessment: 85 year old male admitted with chest pain with history of persistent Afib on Eliquis PTA (last dose 3/8 0700). Pharmacy consulted to dose heparin. Planning for cardiac cath 3/10.  aPTT is therapeutic at 67 after rate increased to 1400 units/hr. Hgb 13.9 (stable), platelets 153>126, no s/sx bleeding reported.  Goal of Therapy:  Heparin level 0.3-0.7 units/ml aPTT 66-102 seconds Monitor platelets by anticoagulation protocol: Yes   Plan:  Continue heparin at 1400 units/hr Daily aPTT/heparin level, CBC, s/sx bleeding  Fara Olden, PharmD PGY-1 Pharmacy Resident 12/13/2020 2:11 PM Please see AMION for all pharmacy numbers

## 2020-12-13 NOTE — Progress Notes (Signed)
Pt. Placed on auto CPAP at this time 20/5, VSS, pt. Resting comfortable, RT wil continue to monitor.

## 2020-12-14 ENCOUNTER — Ambulatory Visit (HOSPITAL_COMMUNITY): Admit: 2020-12-14 | Payer: Medicare HMO | Admitting: Cardiology

## 2020-12-14 ENCOUNTER — Other Ambulatory Visit: Payer: Self-pay | Admitting: Cardiology

## 2020-12-14 ENCOUNTER — Encounter (HOSPITAL_COMMUNITY)
Admission: EM | Disposition: A | Discharge status: Home / Self Care | Payer: Self-pay | Source: Home / Self Care | Attending: Cardiovascular Disease

## 2020-12-14 ENCOUNTER — Other Ambulatory Visit (HOSPITAL_COMMUNITY): Payer: Medicare HMO

## 2020-12-14 ENCOUNTER — Encounter (HOSPITAL_COMMUNITY): Payer: Self-pay | Admitting: Cardiology

## 2020-12-14 DIAGNOSIS — I4891 Unspecified atrial fibrillation: Secondary | ICD-10-CM

## 2020-12-14 DIAGNOSIS — I482 Chronic atrial fibrillation, unspecified: Secondary | ICD-10-CM

## 2020-12-14 DIAGNOSIS — I429 Cardiomyopathy, unspecified: Secondary | ICD-10-CM

## 2020-12-14 DIAGNOSIS — I25110 Atherosclerotic heart disease of native coronary artery with unstable angina pectoris: Principal | ICD-10-CM

## 2020-12-14 DIAGNOSIS — I4821 Permanent atrial fibrillation: Secondary | ICD-10-CM | POA: Diagnosis not present

## 2020-12-14 DIAGNOSIS — N1831 Chronic kidney disease, stage 3a: Secondary | ICD-10-CM | POA: Diagnosis not present

## 2020-12-14 DIAGNOSIS — R072 Precordial pain: Secondary | ICD-10-CM

## 2020-12-14 HISTORY — PX: LEFT HEART CATH AND CORONARY ANGIOGRAPHY: CATH118249

## 2020-12-14 LAB — GLUCOSE, CAPILLARY
Glucose-Capillary: 128 mg/dL — ABNORMAL HIGH (ref 70–99)
Glucose-Capillary: 113 mg/dL — ABNORMAL HIGH (ref 70–99)

## 2020-12-14 LAB — APTT: aPTT: 103 seconds — ABNORMAL HIGH (ref 24–36)

## 2020-12-14 LAB — BASIC METABOLIC PANEL
Anion gap: 7 (ref 5–15)
BUN: 18 mg/dL (ref 8–23)
CO2: 25 mmol/L (ref 22–32)
Calcium: 8.6 mg/dL — ABNORMAL LOW (ref 8.9–10.3)
Chloride: 105 mmol/L (ref 98–111)
Creatinine, Ser: 1.38 mg/dL — ABNORMAL HIGH (ref 0.61–1.24)
GFR, Estimated: 51 mL/min — ABNORMAL LOW (ref >60)
Glucose, Bld: 120 mg/dL — ABNORMAL HIGH (ref 70–99)
Potassium: 3.8 mmol/L (ref 3.5–5.1)
Sodium: 137 mmol/L (ref 135–145)

## 2020-12-14 LAB — HEPARIN LEVEL (UNFRACTIONATED): Heparin Unfractionated: 0.86 IU/mL — ABNORMAL HIGH (ref 0.30–0.70)

## 2020-12-14 SURGERY — LEFT HEART CATH AND CORONARY ANGIOGRAPHY
Anesthesia: LOCAL

## 2020-12-14 MED ORDER — HEPARIN (PORCINE) 25000 UT/250ML-% IV SOLN
1400.0000 [IU]/h | INTRAVENOUS | Status: DC
  Filled 2020-12-14: qty 250

## 2020-12-14 MED ORDER — HEPARIN SODIUM (PORCINE) 1000 UNIT/ML IJ SOLN
INTRAMUSCULAR | Status: DC | PRN
  Administered 2020-12-14: 4500 [IU] via INTRAVENOUS

## 2020-12-14 MED ORDER — VERAPAMIL HCL 2.5 MG/ML IV SOLN
INTRAVENOUS | Status: DC | PRN
  Administered 2020-12-14: 10 mL via INTRA_ARTERIAL

## 2020-12-14 MED ORDER — MIDAZOLAM HCL 2 MG/2ML IJ SOLN
INTRAMUSCULAR | Status: DC | PRN
  Administered 2020-12-14: 1 mg via INTRAVENOUS

## 2020-12-14 MED ORDER — FENTANYL CITRATE (PF) 100 MCG/2ML IJ SOLN
INTRAMUSCULAR | Status: DC | PRN
  Administered 2020-12-14: 25 ug via INTRAVENOUS

## 2020-12-14 MED ORDER — HEPARIN (PORCINE) IN NACL 1000-0.9 UT/500ML-% IV SOLN
INTRAVENOUS | Status: DC | PRN
  Administered 2020-12-14 (×2): 500 mL

## 2020-12-14 MED ORDER — FENTANYL CITRATE (PF) 100 MCG/2ML IJ SOLN
INTRAMUSCULAR | Status: AC
  Filled 2020-12-14: qty 2

## 2020-12-14 MED ORDER — LIDOCAINE HCL (PF) 1 % IJ SOLN
INTRAMUSCULAR | Status: DC | PRN
  Administered 2020-12-14: 2 mL

## 2020-12-14 MED ORDER — SODIUM CHLORIDE 0.9 % IV SOLN: 250.0000 mL | INTRAVENOUS | Status: DC | PRN

## 2020-12-14 MED ORDER — VERAPAMIL HCL 2.5 MG/ML IV SOLN
INTRAVENOUS | Status: AC
  Filled 2020-12-14: qty 2

## 2020-12-14 MED ORDER — SODIUM CHLORIDE 0.9% FLUSH: 3.0000 mL | Freq: Two times a day (BID) | INTRAVENOUS | Status: DC

## 2020-12-14 MED ORDER — LIDOCAINE HCL (PF) 1 % IJ SOLN
INTRAMUSCULAR | Status: AC
  Filled 2020-12-14: qty 30

## 2020-12-14 MED ORDER — LABETALOL HCL 5 MG/ML IV SOLN: 10.0000 mg | INTRAVENOUS | Status: AC | PRN

## 2020-12-14 MED ORDER — HEPARIN SODIUM (PORCINE) 1000 UNIT/ML IJ SOLN
INTRAMUSCULAR | Status: AC
  Filled 2020-12-14: qty 1

## 2020-12-14 MED ORDER — SODIUM CHLORIDE 0.9% FLUSH: 3.0000 mL | INTRAVENOUS | Status: DC | PRN

## 2020-12-14 MED ORDER — SODIUM CHLORIDE 0.9 % IV SOLN: INTRAVENOUS | Status: DC

## 2020-12-14 MED ORDER — MIDAZOLAM HCL 2 MG/2ML IJ SOLN
INTRAMUSCULAR | Status: AC
  Filled 2020-12-14: qty 2

## 2020-12-14 MED ORDER — IOHEXOL 350 MG/ML SOLN
INTRAVENOUS | Status: DC | PRN
  Administered 2020-12-14: 80 mL via INTRA_ARTERIAL

## 2020-12-14 MED ORDER — HYDRALAZINE HCL 20 MG/ML IJ SOLN: 10.0000 mg | INTRAMUSCULAR | Status: AC | PRN

## 2020-12-14 MED ORDER — HEPARIN (PORCINE) IN NACL 1000-0.9 UT/500ML-% IV SOLN
INTRAVENOUS | Status: AC
  Filled 2020-12-14: qty 1000

## 2020-12-14 SURGICAL SUPPLY — 11 items
BAG SNAP BAND KOVER 36X36 (MISCELLANEOUS) ×1 IMPLANT
CATH OPTITORQUE TIG 4.0 5F (CATHETERS) ×1 IMPLANT
COVER DOME SNAP 22 D (MISCELLANEOUS) ×1 IMPLANT
DEVICE RAD TR BAND REGULAR (VASCULAR PRODUCTS) ×1 IMPLANT
GLIDESHEATH SLEND SS 6F .021 (SHEATH) ×1 IMPLANT
GUIDEWIRE INQWIRE 1.5J.035X260 (WIRE) IMPLANT
INQWIRE 1.5J .035X260CM (WIRE) ×2
KIT HEART LEFT (KITS) ×2 IMPLANT
PACK CARDIAC CATHETERIZATION (CUSTOM PROCEDURE TRAY) ×2 IMPLANT
TRANSDUCER W/STOPCOCK (MISCELLANEOUS) ×2 IMPLANT
TUBING CIL FLEX 10 FLL-RA (TUBING) ×2 IMPLANT

## 2020-12-14 NOTE — Consult Note (Signed)
ELECTROPHYSIOLOGY CONSULT NOTE    Patient ID: [name]: [id number], DOB/AGE: 83-Aug-1937 83 y.o.  Admit date: 12/12/2020 Date of Consult: 12/14/2020  Primary Physician: Patient, No Pcp Per Primary Cardiologist: No primary care provider on file.  Electrophysiologist: New (Also recently established with [facility])  Referring Provider: Dr. Claiborne Billings  Patient Profile: Edward Mcdaniel is a 85 y.o. male with a history of recently diagnosed LV dysfunction (EF 40-45% 11/14/20 at [facility]), Persistent atrial fibrillation failing multiple DCCs, flecainide (QRS widening), and amiodarone (? toxicity, but re-challenged), OSA, ETOH use, former tobacco abuse, DM, HTN, HLD, and prior SCC of the scin  who is being seen today for the evaluation of atrial fibrillation with slow ventricular rates at the request of Dr. Claiborne Billings.  HPI:  Edward Mcdaniel is a 85 y.o. male with medical history above who presented with exertional dyspnea and CP.   Pertinent labs on admission include K 3.9, Cr 1.31, BNP 321.5, HS trop peaked at 36.  LHC this am with minimal, non-obstructive CAD.   Pt states he is doing OK currently. Denies dizziness, lightheadedness, syncope, or near syncope with HRs 40-50s mostly, at times dipping into 30 with missed beat.   He has struggled with AF for "years".  He has only noticed decreased exercise tolerance in the past 2-3 months. Can walk 5 holes of golf before being too fatigue. He can play 18 (and did so Monday) with a golf cart.   He is followed at the [facility] by EP, and planned for AF ablation next month, after having previously failed amiodarone and flecainide as above. He was actually planned for cath at [facility] next week, but with symptoms of vague substernal chest pain presented here. Not exertional, no specific aggravating or relieving factors.   Looking at Synopsis, pt has a HR of 46 documented as far back as 03/2016.  Reports Echo 2017 "EF>55%, Mild AI, MR,  Mild posterior MVP Echo 11/2020 EF 40-45%, mild LVH, severely reduced RV function, mild AI,   Past Medical History:  Diagnosis Date  . Allergic rhinitis   . Asthma   . DM type 2 (diabetes mellitus, type 2) (HCC)    diet controlled  . HTN (hypertension)   . Hyperlipidemia   . LV dysfunction   . Persistent atrial fibrillation ([place])   . Squamous cell carcinoma of skin 10/02/2011   right arm - tx p bx  . Squamous cell carcinoma of skin 01/03/2014   in situ on right cheek - tx p bx  . Squamous cell carcinoma of skin 07/08/2017   in situ on left hand - tx p bx  . Squamous cell carcinoma of skin 03/25/2018   well differentiated on top of left hand - tx p bx  . Squamous cell carcinoma of skin 02/15/2020   well differentiated on left zygomatic area cx3, excision  . Squamous cell carcinoma of skin 02/15/2020   in situ on right mid antihelix cx3 51fu     Surgical History:  Past Surgical History:  Procedure Laterality Date  . HERNIA REPAIR     x 2  . KNEE SURGERY     bilateral  . RECTAL SURGERY    . shoulder bone spur / tendon repair       Medications Prior to Admission  Medication Sig Dispense Refill Last Dose  . albuterol (VENTOLIN HFA) 108 (90 Base) MCG/ACT inhaler Inhale into the lungs every 6 (six) hours as needed for wheezing or shortness  of breath.   Past Month at Unknown time  . amiodarone (PACERONE) 200 MG tablet Take 200 mg by mouth daily.   12/12/2020 at Unknown time  . amLODipine (NORVASC) 2.5 MG tablet Take 2.5 mg by mouth at bedtime.   12/12/2020 at Unknown time  . apixaban (ELIQUIS) 5 MG TABS tablet Take 5 mg by mouth 2 (two) times daily.   12/12/2020 at 0700  . Carboxymethylcellulose Sod PF 1 % GEL Place 1 drop into both eyes at bedtime.   Past Week at Unknown time  . cyclobenzaprine (FLEXERIL) 10 MG tablet Take 10 mg by mouth 3 (three) times daily as needed for muscle spasms.   Past Week at Unknown time  . hydrochlorothiazide (MICROZIDE) 12.5 MG capsule Take 12.5 mg by  mouth Daily.   12/12/2020 at Unknown time  . lisinopril (ZESTRIL) 40 MG tablet Take 40 mg by mouth daily.   12/12/2020 at Unknown time  . loratadine (CLARITIN) 10 MG tablet Take 10 mg by mouth daily as needed for allergies.   Past Week at Unknown time  . MENTHOL-METHYL SALICYLATE EX Apply 1 application topically 2 (two) times daily as needed (for joint pain.).   Past Week at Unknown time  . metFORMIN (GLUCOPHAGE-XR) 500 MG 24 hr tablet Take 500 mg by mouth daily with breakfast.   12/12/2020 at Unknown time  . metoprolol succinate (TOPROL-XL) 50 MG 24 hr tablet Take 25 mg by mouth daily. Take with or immediately following a meal.   12/12/2020 at 0700  . pravastatin (PRAVACHOL) 40 MG tablet Take 40 mg by mouth at bedtime.   Past Week at Unknown time  . Propylene Glycol (SYSTANE BALANCE) 0.6 % SOLN Place 1 drop into both eyes 4 (four) times daily as needed (dry eyes).   12/12/2020 at Unknown time  . tamsulosin (FLOMAX) 0.4 MG CAPS capsule Take 0.4 mg by mouth at bedtime.   Past Week at Unknown time    Inpatient Medications:  . aspirin EC  81 mg Oral Daily  . folic acid  1 mg Oral Daily  . insulin aspart  0-9 Units Subcutaneous TID WC  . multivitamin with minerals  1 tablet Oral Daily  . pravastatin  40 mg Oral q1800  . sodium chloride flush  3 mL Intravenous Q12H  . sodium chloride flush  3 mL Intravenous Q12H    Allergies: No Known Allergies  Social History   Socioeconomic History  . Marital status: Widowed    Spouse name: Not on file  . Number of children: Not on file  . Years of education: Not on file  . Highest education level: Not on file  Occupational History  . Occupation: retired > [employer]   Tobacco Use  . Smoking status: Former Smoker    Packs/day: 1.50    Years: 30.00    Pack years: 45.00    Types: Cigarettes    Quit date: 10/08/1987    Years since quitting: 33.2  . Smokeless tobacco: Former Systems developer    Types: Chew  Substance and Sexual Activity  . Alcohol use: Yes     Alcohol/week: 0.0 standard drinks    Comment: 1-2 shots of whiskey a few days a week  . Drug use: Never  . Sexual activity: Not on file  Other Topics Concern  . Not on file  Social History Narrative   veteran   Social Determinants of Health   Financial Resource Strain: Not on file  Food Insecurity: Not on file  Transportation Needs:  Not on file  Physical Activity: Not on file  Stress: Not on file  Social Connections: Not on file  Intimate Partner Violence: Not on file     Family History  Problem Relation Age of Onset  . Lung cancer Mother        smoker  . Heart disease Mother        bypass, further details unclear  . Heart disease Brother        bypass, further details unclear     Review of Systems: All other systems reviewed and are otherwise negative except as noted above.  Physical Exam: Vitals:   12/13/20 1832 12/13/20 1927 12/13/20 2312 12/14/20 0458  BP: 112/71 129/76  120/78  Pulse:  (!) 50 (!) 53 (!) 42  Resp:  18 16 18   Temp:  98.3 F (36.8 C)  97.8 F (36.6 C)  TempSrc:  Oral  Oral  SpO2:  97% 93% 95%  Weight:    84.7 kg  Height:        GEN- The patient is well appearing, alert and oriented x 3 today.   HEENT: normocephalic, atraumatic; sclera clear, conjunctiva pink; hearing intact; oropharynx clear; neck supple Lungs- Clear to ausculation bilaterally, normal work of breathing.  No wheezes, rales, rhonchi Heart- Regular rate and rhythm, no murmurs, rubs or gallops GI- soft, non-tender, non-distended, bowel sounds present Extremities- no clubbing, cyanosis, or edema; DP/PT/radial pulses 2+ bilaterally MS- no significant deformity or atrophy Skin- warm and dry, no rash or lesion Psych- euthymic mood, full affect Neuro- strength and sensation are intact  Labs:   Lab Results  Component Value Date   WBC 6.7 12/13/2020   HGB 13.9 12/13/2020   HCT 41.5 12/13/2020   MCV 96.5 12/13/2020   PLT 126 (L) 12/13/2020    Recent Labs  Lab 12/13/20 0248  12/14/20 0229  NA 137 137  K 4.0 3.8  CL 103 105  CO2 22 25  BUN 18 18  CREATININE 1.21 1.38*  CALCIUM 9.2 8.6*  PROT 5.9*  --   BILITOT 1.7*  --   ALKPHOS 51  --   ALT 29  --   AST 20  --   GLUCOSE 107* 120*      Radiology/Studies: DG Chest 2 View  Result Date: 12/12/2020 CLINICAL DATA:  Chest pain and shortness of breath EXAM: CHEST - 2 VIEW COMPARISON:  March 22, 2012 FINDINGS: Lungs are clear. Heart is upper normal in size with pulmonary vascularity normal. There is aortic atherosclerosis. No adenopathy. No bone lesions. IMPRESSION: Lungs clear. Heart upper normal in size. Aortic Atherosclerosis (ICD10-I70.0). Electronically Signed   By: Lowella Grip III M.D.   On: 12/12/2020 11:10    EKG: on arrival shows AF with slow ventricular rates at 59 bpm and LBBB pattern QRS at 132 ms (personally reviewed) Follow up EKG 3/9 shows AF with slower rates at 44 bpm  TELEMETRY: AF with slow ventricular rates 30-50s, trends slower nocturnally (personally reviewed)  Assessment/Plan: 1.  AF with slow RVR Per notes and patients has struggled with AF for years.  Has failed flecainide and now amio with recent [facility] with ERAF.  Rates remains low off Lopressor and amiodarone.   He is scheduled for AF ablation next month at the [place]. He understands that ablation may cause his HR to drop even lower, and require pacing.   2. Chronic bradycardia He has documented HRs into the 40s back to 03/2016. This does not appear to be the  cause of his symptoms.  No urgent indication for pacing at this time.   3. DOE Suspect multifactorial including RV failure per recent echo from [place].   ADDENDUM Dr. [name] has seen and agrees no urgent indication for pacing. [facility] and would like to have work up for ablation here in town. Will schedule outpatient follow up with Dr. [name].   EP to see as needed while remains here.   For questions or updates, please contact [place] Please consult  [email or web address] for contact info under Cardiology/STEMI.  Jacalyn Lefevre, PA-C  12/14/2020 8:19 AM

## 2020-12-14 NOTE — Interval H&P Note (Signed)
History and Physical Interval Note:  12/14/2020 9:27 AM  Edward Mcdaniel  has presented today for surgery, with the diagnosis of chest pain - concern for unstable angina.  The various methods of treatment have been discussed with the patient and family. After consideration of risks, benefits and other options for treatment, the patient has consented to  Procedure(s): LEFT HEART CATH AND CORONARY ANGIOGRAPHY (N/A)  PERCUTANEOUS CORONARY INTERVENTION  as a surgical intervention.  The patient's history has been reviewed, patient examined, no change in status, stable for surgery.  I have reviewed the patient's chart and labs.  Questions were answered to the patient's satisfaction.     Glenetta Hew

## 2020-12-14 NOTE — Interval H&P Note (Signed)
History and Physical Interval Note:  12/14/2020 9:29 AM  Cath Lab Visit (complete for each Cath Lab visit)  Clinical Evaluation Leading to the Procedure:   ACS: Yes.    Non-ACS:    Anginal Classification: CCS III  Anti-ischemic medical therapy: Minimal Therapy (1 class of medications)  Non-Invasive Test Results: No non-invasive testing performed  Prior CABG: No previous CABG    Edward Mcdaniel

## 2020-12-14 NOTE — Plan of Care (Signed)

## 2020-12-14 NOTE — Progress Notes (Signed)
Progress Note  Patient Name: Edward Mcdaniel Date of Encounter: 12/14/2020  Primary Cardiologist:  [facility]  Subjective   Back from cath lab; feels well  Inpatient Medications    Scheduled Meds: . aspirin EC  81 mg Oral Daily  . folic acid  1 mg Oral Daily  . insulin aspart  0-9 Units Subcutaneous TID WC  . multivitamin with minerals  1 tablet Oral Daily  . pravastatin  40 mg Oral q1800  . sodium chloride flush  3 mL Intravenous Q12H  . sodium chloride flush  3 mL Intravenous Q12H   Continuous Infusions: . sodium chloride    . sodium chloride 75 mL/hr at 12/14/20 1047  . sodium chloride    . heparin     PRN Meds: sodium chloride, sodium chloride, acetaminophen, hydrALAZINE, labetalol, loratadine, Muscle Rub, nitroGLYCERIN, ondansetron (ZOFRAN) IV, sodium chloride flush, sodium chloride flush   Vital Signs    Vitals:   12/14/20 0830 12/14/20 0921 12/14/20 1009 12/14/20 1155  BP: 135/88  132/82 132/89  Pulse:   (!) 57 60  Resp: 18  16 16   Temp: 98.9 F (37.2 C)  98.7 F (37.1 C)   TempSrc: Oral  Oral   SpO2: 98% 99% 96% 97%  Weight:      Height:        Intake/Output Summary (Last 24 hours) at 12/14/2020 1157 Last data filed at 12/14/2020 1100 Gross per 24 hour  Intake 991.77 ml  Output 500 ml  Net 491.77 ml    I/O since admission: +225  Filed Weights   12/12/20 2251 12/13/20 0500 12/14/20 0458  Weight: 85.8 kg 85.8 kg 84.7 kg    Telemetry    Atrial fib, currently ~ 50- Personally Reviewed  ECG    ECG (independently read by me): AF at 48 with QRS 132 msec LBBB  Physical Exam    BP 132/89 (BP Location: Right Arm)   Pulse 60   Temp 98.7 F (37.1 C) (Oral)   Resp 16   Ht 5\' 11"  (1.803 m)   Wt 84.7 kg   SpO2 97%   BMI 26.04 kg/m  General: Alert, oriented, no distress.  Skin: normal turgor, no rashes, warm and dry HEENT: Normocephalic, atraumatic. Pupils equal round and reactive to light; sclera anicteric; extraocular muscles  intact;  Nose without nasal septal hypertrophy Mouth/Parynx benign;  Neck: No JVD, no carotid bruits; normal carotid upstroke Lungs: clear to ausculatation and percussion; no wheezing or rales Chest wall: without tenderness to palpitation Heart: PMI not displaced, irreg, irreg 48 - 54, s1 s2 normal, 1/6 systolic murmur, no diastolic murmur, no rubs, gallops, thrills, or heaves Abdomen: soft, nontender; no hepatosplenomehaly, BS+; abdominal aorta nontender and not dilated by palpation. Back: no CVA tenderness Pulses 2+ R radial site stable Musculoskeletal: full range of motion, normal strength, no joint deformities Extremities: no clubbing cyanosis or edema, Homan's sign negative  Neurologic: grossly nonfocal; Cranial nerves grossly wnl Psychologic: Normal mood and affect   Labs    Chemistry Recent Labs  Lab 12/12/20 1011 12/13/20 0248 12/14/20 0229  NA 136 137 137  K 3.9 4.0 3.8  CL 101 103 105  CO2 27 22 25   GLUCOSE 172* 107* 120*  BUN 17 18 18   CREATININE 1.31* 1.21 1.38*  CALCIUM 9.3 9.2 8.6*  PROT 6.7 5.9*  --   ALBUMIN 4.2 3.6  --   AST 26 20  --   ALT 34 29  --   ALKPHOS  66 51  --   BILITOT 1.5* 1.7*  --   GFRNONAA 54* 60* 51*  ANIONGAP 8 12 7      Hematology Recent Labs  Lab 12/12/20 1011 12/13/20 0248  WBC 6.0 6.7  RBC 4.51 4.30  HGB 14.6 13.9  HCT 45.1 41.5  MCV 100.0 96.5  MCH 32.4 32.3  MCHC 32.4 33.5  RDW 14.7 14.6  PLT 153 126*    Cardiac EnzymesNo results for input(s): TROPONINI in the last 168 hours. No results for input(s): TROPIPOC in the last 168 hours.   BNP Recent Labs  Lab 12/12/20 1749  BNP 321.5*     DDimer No results for input(s): DDIMER in the last 168 hours.   Lipid Panel     Component Value Date/Time   CHOL 103 12/13/2020 0248   TRIG 31 12/13/2020 0248   HDL 46 12/13/2020 0248   CHOLHDL 2.2 12/13/2020 0248   VLDL 6 12/13/2020 0248   LDLCALC 51 12/13/2020 0248     Radiology    CARDIAC CATHETERIZATION  Result  Date: 12/14/2020  Ost Cx to Dist Cx lesion is 15% stenosed with 20% stenosed side branch in 3rd Mrg.  Prox LAD to Mid LAD lesion is 20% stenosed with 25% stenosed side branch in 1st Diag.  LV end diastolic pressure is normal.   Angiographically minimal CAD with diffuse calcification but no significant stenosis.  LV gram not performed in order to conserve contrast, relatively normal LVEDP. RECOMMENDATIONS:  Return to nursing for ongoing care.  We will write to start heparin pending decision about potential pacemaker.  If no PPM, would restart Eliquis tonight.  Gentle post-cath hydration Glenetta Hew, MD   Cardiac Studies     I  Patient Profile     85 y.o. male with persistent atrial fibrillation,recently diagnosed LV dysfunction (EF 40-45%), habitual ETOH use,former tobacco use,DM, HTN, HLD, prior SCC of the skin whom we were asked to see by Dr. [name] for exertional dyspnea and chest pain.  Assessment & Plan    1. Chest pain with dyspnea: Patient underwent successful cardiac catheterization today which did not reveal any significant obstructive disease but there is evidence for luminal irregularity and mild atherosclerosis.  Eliquis has been held for cath.  Can resume dose later tonight or begin in a.m.  2.  Permanent atrial fibrillation with slow ventricular response: Amiodarone and metoprolol has been held during the admission.  Developed recurrent atrial fibrillation following cardioversion on amiodarone.  He admits always to having a fairly slow heart rate.  He has been off amiodarone and metoprolol since admission.  Atrial fibrillation rate has improved which to now in the upper 40s to low 50s.  EP is evaluating patient today Dr. [name]..  Plan is for future A. fib ablation procedure, currently tentatively scheduled at the [facility] but the patient today was wondering in the future if this potentially could be done   3.  Mild renal insufficiency: 91.38 today.  Continue  low-level hydration post procedure  4.  Hyperlipidemia: On pravastatin, LDL 51  5.  Diabetes mellitus with hemoglobin A1c 7.2.  Metformin on hold following cath   Pending  EP evaluation, plan probable discharge later today if remains stable.  Signed, [name], MD, [facility] 12/14/2020, 11:57 AM

## 2020-12-14 NOTE — Discharge Summary (Signed)
Discharge Summary    Patient ID: DIMITRY HOLSWORTH MRN: [id number]; DOB: [birth year]  Admit date: 12/12/2020 Discharge date: 12/14/2020  PCP:  Patient, No Pcp Per   [place]  Cardiologist:  [facility] Advanced Practice Provider:  No care team member to display Electrophysiologist:  Vickie Epley, MD  [id number]}   Discharge Diagnoses    Principal Problem:   Precordial chest pain Active Problems:   Diabetes mellitus type 2 in nonobese ([place])   HYPERCHOLESTEROLEMIA   Essential hypertension   Atrial fibrillation with slow ventricular response ([place])   Cardiomyopathy ([place])   Chest pain    Diagnostic Studies/Procedures    Cath: 12/14/20   Ost Cx to Dist Cx lesion is 15% stenosed with 20% stenosed side branch in 3rd Mrg.  Prox LAD to Mid LAD lesion is 20% stenosed with 25% stenosed side branch in 1st Diag.  LV end diastolic pressure is normal.     Angiographically minimal CAD with diffuse calcification but no significant stenosis.  LV gram not performed in order to conserve contrast, relatively normal LVEDP.   RECOMMENDATIONS:  Return to nursing for ongoing care.  We will write to start heparin pending decision about potential pacemaker.  If no PPM, would restart Eliquis tonight.  Gentle post-cath hydration   Glenetta Hew, MD  Diagnostic Dominance: Right    _____________   History of Present Illness     AEDON DEASON is a 85 y.o. male with persistent atrial fibrillation, recently diagnosed LV dysfunction (EF 40-45%), habitual ETOH use, former tobacco use, DM, HTN, HLD, prior SCC of the skin who presented with exertional dyspnea and chest pain.   Mr. Ryback has some concept of his prior cardiac history but information was best obtained through the note he brought in from the [place] and he does not know what medicines he was taking. He has no prior history of CAD. He has had atrial fibrillation since 2017 and failed multiple  cardioversions as well as flecainide due to QRS widening. He was on amiodarone up until July 2021 when it was discontinued due to potential toxicity. He experienced recurrent symptomatic atrial fib and amiodarone was restarted (200mg  BID x 7 days then down to 1 tablet daily - patient unsure when he down-titrated this dose). He subsequently underwent DCCV 10/12/20 but reverted to AF 3 days later. He saw Ileene Hutchinson EP PA in consultation at the [place] who discussed options with him and he was pending referral to [facility] for ablation. Prior echo 2017 was reported to have shown EF >55%, mild AI/MR, mild posterior MVP. 2D echocardiogram was updated 11/14/20 showing EF 40-45%, mild LVH, severely reduced RV function, mild AI, mild-moderate MR, mild TR, mildly dilated ascending aorta. He was started on Toprol (dose unknown by patient at this time, recalls a "half pill" recently). He subsequently underwent nuclear stress test showing no reversible ischemia, suspect small infarct at cardiac apex, diminshed EF 48%, elevated EDP.  He was continuing to have some generalized fatigue and DOE therefore was referred for [facility] for more definitive ischemic evaluation.  Overnight around 2am he awoke with vague substernal chest pain. It was fairly persistent, not made worse or better by anything in particular, and so he went onto his [place] appointment for labs this AM. Do not have access to those reports. Due to persistent symptoms he came to the ER for evaluation. Chest pain has spontaneously improved but he can still feel some discomfort. He is noted to have HR  in the 29-45 range while in the ED. Aside from mild vague chest discomfort, he is completely asymptomatic from a bradycardia standpoint without dizziness, pre-syncope or recent syncope. Chest pain did not worsen with inspiration, exertion, palpation or any other measures. Labs notable for hsTroponin 32->32, Cr 1.31 without prior baseline, CBC wnl. CXR with clear lungs, heart upper  normal in size and aortic atherosclerosis. [facility] chart indicated 3-4 beers weekly. Otherwise no recent acute symptoms. Denies any accidental overingestion of medicines.   Hospital Course     Consultants: EP  1. Chest pain with dyspnea/Unstable angina: vague persistent discomfort with low-level troponin elevation, recently pending outpatient cath at the [facility]. hsTn 32>>36. Underwent cardiac cath noted above with mild non-obstructive CAD.   2. Persistent atrial fib, here withslow ventricular responseHR primarily 30s-low 50s: remained hemodynamically stable with rates in the 30-50 range, No urgent indication for PPM per EP consultation. Patient reported he would like to transition to EP care with [facility]. Follow up arranged with Dr. [name] per EP. -- amiodarone, metoprolol and amlodipine held -- ordered for outpatient cardiac monitor at discharge to follow HR with the need to stop med noted above.  3. Recently diagnosed cardiomyopathy of uncertain etiology: no volume overload on exam -- holding BB, ACEi  3. Essential HTN: stable with HCTZ and lisinopril  -- as above metoprolol and amlodipine held  4. Renal insufficiency of unknown chronicity: 1.3>>1.2>>1.3. Suspect this is his baseline -- BMET at follow up  5.NIDDM: Hgb A1c 7.2 -- treated with SSI while inpatient, resumed metformin 48 hrs post cath  6. Hyperlipidemia: continue pravastatin  -- LDL 51   Did the patient have an acute coronary syndrome (MI, NSTEMI, STEMI, etc) this admission?:  No.   The elevated Troponin was due to the acute medical illness (demand ischemia).      _____________  Discharge Vitals Blood pressure (!) 143/71, pulse (!) 110, temperature 98.7 F (37.1 C), temperature source Oral, resp. rate 16, height 5\' 11"  (1.803 m), weight 84.7 kg, SpO2 97 %.  Filed Weights   12/12/20 2251 12/13/20 0500 12/14/20 0458  Weight: 85.8 kg 85.8 kg 84.7 kg    Labs & Radiologic Studies    CBC Recent Labs     12/12/20 1011 12/13/20 0248  WBC 6.0 6.7  HGB 14.6 13.9  HCT 45.1 41.5  MCV 100.0 96.5  PLT 153 937*   Basic Metabolic Panel Recent Labs    12/12/20 1011 12/13/20 0248 12/14/20 0229  NA 136 137 137  K 3.9 4.0 3.8  CL 101 103 105  CO2 27 22 25   GLUCOSE 172* 107* 120*  BUN 17 18 18   CREATININE 1.31* 1.21 1.38*  CALCIUM 9.3 9.2 8.6*  MG 1.9  --   --   PHOS 3.5  --   --    Liver Function Tests Recent Labs    12/12/20 1011 12/13/20 0248  AST 26 20  ALT 34 29  ALKPHOS 66 51  BILITOT 1.5* 1.7*  PROT 6.7 5.9*  ALBUMIN 4.2 3.6   Recent Labs    12/12/20 1011  LIPASE 31   High Sensitivity Troponin:   Recent Labs  Lab 12/12/20 1011 12/12/20 1209 12/12/20 1749  TROPONINIHS 32* 32* 36*    BNP Invalid input(s): POCBNP D-Dimer No results for input(s): DDIMER in the last 72 hours. Hemoglobin A1C Recent Labs    12/12/20 1749  HGBA1C 7.2*   Fasting Lipid Panel Recent Labs    12/13/20 0248  CHOL 103  HDL 46  LDLCALC 51  TRIG 31  CHOLHDL 2.2   Thyroid Function Tests Recent Labs    12/12/20 1749  TSH 0.977   _____________  DG Chest 2 View  Result Date: 12/12/2020 CLINICAL DATA:  Chest pain and shortness of breath EXAM: CHEST - 2 VIEW COMPARISON:  March 22, 2012 FINDINGS: Lungs are clear. Heart is upper normal in size with pulmonary vascularity normal. There is aortic atherosclerosis. No adenopathy. No bone lesions. IMPRESSION: Lungs clear. Heart upper normal in size. Aortic Atherosclerosis (ICD10-I70.0). Electronically Signed   By: Lowella Grip III M.D.   On: 12/12/2020 11:10   CARDIAC CATHETERIZATION  Result Date: 12/14/2020  [place] Cx to Dist Cx lesion is 15% stenosed with 20% stenosed side branch in 3rd Mrg.  Prox LAD to Mid LAD lesion is 20% stenosed with 25% stenosed side branch in 1st Diag.  LV end diastolic pressure is normal.   Angiographically minimal CAD with diffuse calcification but no significant stenosis.  LV gram not performed in order  to conserve contrast, relatively normal LVEDP. RECOMMENDATIONS:  Return to nursing for ongoing care.  We will write to start heparin pending decision about potential pacemaker.  If no PPM, would restart Eliquis tonight.  Gentle post-cath hydration Glenetta Hew, MD  Disposition   Pt is being discharged home today in good condition.  Follow-up Plans & Appointments     Follow-up Information    Vickie Epley, MD Follow up on 12/29/2020.   Specialties: Cardiology, Radiology Why: at 2:30pm for your follow up appt Contact information: [place] [address] [phone]              Discharge Instructions    Call MD for:  difficulty breathing, headache or visual disturbances   Complete by: As directed    Call MD for:  persistant dizziness or light-headedness   Complete by: As directed    Call MD for:  redness, tenderness, or signs of infection (pain, swelling, redness, odor or green/yellow discharge around incision site)   Complete by: As directed    Diet - low sodium heart healthy   Complete by: As directed    Discharge instructions   Complete by: As directed    Radial Site Care Refer to this sheet in the next few weeks. These instructions provide you with information on caring for yourself after your procedure. Your caregiver may also give you more specific instructions. Your treatment has been planned according to current medical practices, but problems sometimes occur. Call your caregiver if you have any problems or questions after your procedure. HOME CARE INSTRUCTIONS You may shower the day after the procedure.Remove the bandage (dressing) and gently wash the site with plain soap and water.Gently pat the site dry.  Do not apply powder or lotion to the site.  Do not submerge the affected site in water for 3 to 5 days.  Inspect the site at least twice daily.  Do not flex or bend the affected arm for 24 hours.  No lifting over 5 pounds (2.3 kg)  for 5 days after your procedure.  Do not drive home if you are discharged the same day of the procedure. Have someone else drive you.  You may drive 24 hours after the procedure unless otherwise instructed by your caregiver.  What to expect: Any bruising will usually fade within 1 to 2 weeks.  Blood that collects in the tissue (hematoma) may be painful to the touch. It should  usually decrease in size and tenderness within 1 to 2 weeks.  SEEK IMMEDIATE MEDICAL CARE IF: You have unusual pain at the radial site.  You have redness, warmth, swelling, or pain at the radial site.  You have drainage (other than a small amount of blood on the dressing).  You have chills.  You have a fever or persistent symptoms for more than 72 hours.  You have a fever and your symptoms suddenly get worse.  Your arm becomes pale, cool, tingly, or numb.  You have heavy bleeding from the site. Hold pressure on the site.   The office will mail you the cardiac monitor   Increase activity slowly   Complete by: As directed      Discharge Medications   Allergies as of 12/14/2020   No Known Allergies     Medication List    STOP taking these medications   amiodarone 200 MG tablet Commonly known as: PACERONE   amLODipine 2.5 MG tablet Commonly known as: NORVASC   metoprolol succinate 50 MG 24 hr tablet Commonly known as: TOPROL-XL     TAKE these medications   albuterol 108 (90 Base) MCG/ACT inhaler Commonly known as: VENTOLIN HFA Inhale into the lungs every 6 (six) hours as needed for wheezing or shortness of breath.   apixaban 5 MG Tabs tablet Commonly known as: ELIQUIS Take 5 mg by mouth 2 (two) times daily.   Carboxymethylcellulose Sod PF 1 % Gel Place 1 drop into both eyes at bedtime.   cyclobenzaprine 10 MG tablet Commonly known as: FLEXERIL Take 10 mg by mouth 3 (three) times daily as needed for muscle spasms.   hydrochlorothiazide 12.5 MG capsule Commonly known as: MICROZIDE Take 12.5 mg  by mouth Daily.   lisinopril 40 MG tablet Commonly known as: ZESTRIL Take 40 mg by mouth daily.   loratadine 10 MG tablet Commonly known as: CLARITIN Take 10 mg by mouth daily as needed for allergies.   MENTHOL-METHYL SALICYLATE EX Apply 1 application topically 2 (two) times daily as needed (for joint pain.).   metFORMIN 500 MG 24 hr tablet Commonly known as: GLUCOPHAGE-XR Take 500 mg by mouth daily with breakfast.   pravastatin 40 MG tablet Commonly known as: PRAVACHOL Take 40 mg by mouth at bedtime.   Systane Balance 0.6 % Soln Generic drug: Propylene Glycol Place 1 drop into both eyes 4 (four) times daily as needed (dry eyes).   tamsulosin 0.4 MG Caps capsule Commonly known as: FLOMAX Take 0.4 mg by mouth at bedtime.         Outstanding Labs/Studies   BMET at follow up  Cardiac monitor  Duration of Discharge Encounter   Greater than 30 minutes including physician time.  Signed, Reino Bellis, NP 12/14/2020, 3:18 PM

## 2020-12-14 NOTE — Progress Notes (Signed)
ANTICOAGULATION CONSULT NOTE - Follow Up Consult  Pharmacy Consult for IV heparin Indication: Afib  No Known Allergies  Patient Measurements: Height: 5\' 11"  (180.3 cm) Weight: 84.7 kg (186 lb 11.7 oz) IBW/kg (Calculated) : 75.3 Heparin Dosing Weight: 85.8 kg  Vital Signs: Temp: 98.7 F (37.1 C) (03/10 1009) Temp Source: Oral (03/10 1009) BP: 132/82 (03/10 1009) Pulse Rate: 57 (03/10 1009)  Labs: Recent Labs    12/12/20 1011 12/12/20 1209 12/12/20 1749 12/13/20 0248 12/13/20 1213 12/14/20 0229  HGB 14.6  --   --  13.9  --   --   HCT 45.1  --   --  41.5  --   --   PLT 153  --   --  126*  --   --   APTT  --   --   --  48* 67* 103*  HEPARINUNFRC  --   --   --   --   --  0.86*  CREATININE 1.31*  --   --  1.21  --  1.38*  TROPONINIHS 32* 32* 36*  --   --   --     Estimated Creatinine Clearance: 44 mL/min (A) (by C-G formula based on SCr of 1.38 mg/dL (H)).   Medications:  Infusions:  . sodium chloride    . sodium chloride 75 mL/hr at 12/14/20 1047  . sodium chloride      Assessment: 85 year old male admitted with chest pain with history of persistent Afib on Eliquis PTA (last dose 3/8 0700). Patient is now s/p cath today. Pharmacy consulted to resume heparin 8 hours post sheath removal. Sheath removed 09:56.  aPTT was 103 this morning at 1400 units/hr (prior to cath). Heparin level still elevated due to last Eliquis dose.   CBC yesterday - Hgb 13.9 (stable), platelets 153>126, no s/sx bleeding reported. Will order CBC for tomorrow.  Goal of Therapy:  Heparin level 0.3-0.7 units/ml aPTT 66-102 seconds Monitor platelets by anticoagulation protocol: Yes   Plan:  Resume heparin 1400 units/hr at 1800 aPTT/heparin level 8 hours after resuming heparin Monitor daily aPTT/heparin level, CBC, s/sx bleeding F/u transition back to [place], PharmD PGY-1 Pharmacy Resident 12/14/2020 11:14 AM Please see AMION for all pharmacy numbers

## 2020-12-18 ENCOUNTER — Encounter: Payer: Self-pay | Admitting: *Deleted

## 2020-12-18 NOTE — Progress Notes (Signed)
Patient ID: Edward Mcdaniel, male   DOB: 05-Jun-1936, 85 y.o.   MRN: [id number] Patient enrolled for [employer] to ship a 30 day cardiac event monitor to his home.

## 2020-12-22 ENCOUNTER — Ambulatory Visit (INDEPENDENT_AMBULATORY_CARE_PROVIDER_SITE_OTHER): Payer: No Typology Code available for payment source

## 2020-12-22 ENCOUNTER — Encounter: Payer: Self-pay | Admitting: Cardiology

## 2020-12-22 ENCOUNTER — Telehealth: Payer: Self-pay | Admitting: Physician Assistant

## 2020-12-22 DIAGNOSIS — I482 Chronic atrial fibrillation, unspecified: Secondary | ICD-10-CM | POA: Diagnosis not present

## 2020-12-22 NOTE — Telephone Encounter (Signed)
   [employer] called the answering service after-hours today. Chart reviewed. I was familiar with patient from recent hospitalization - had persistent afib with slow ventricular response and HR even in the 30s at times but was generally asymptomatic during that admission. Amiodarone and metoprolol stopped. He had failed prior DCCVs so was considering ablation at some point. [employer] called to notify they received baseline transmission showing Afib with HR in the 60s. Since this is known for the patient, no action needed. Continue monitor as planned. Routing to Dr. [name] just as Juluis Rainier as he may likely receive the strip faxed to the office.  Charlie Pitter, PA-C

## 2020-12-25 ENCOUNTER — Telehealth: Payer: Self-pay | Admitting: Cardiology

## 2020-12-25 NOTE — Telephone Encounter (Signed)
RN received and review strips by Dr. Acie Fredrickson DOD; no changes; cont wearing monitor, and Dr. [name] has been made aware.    Transmission stated At Fib with rate of 60.

## 2020-12-29 ENCOUNTER — Ambulatory Visit (INDEPENDENT_AMBULATORY_CARE_PROVIDER_SITE_OTHER): Payer: No Typology Code available for payment source | Admitting: Cardiology

## 2020-12-29 ENCOUNTER — Other Ambulatory Visit: Payer: Self-pay

## 2020-12-29 ENCOUNTER — Encounter: Payer: Self-pay | Admitting: Cardiology

## 2020-12-29 VITALS — BP 116/74 | HR 74 | Ht 71.0 in | Wt 190.0 lb

## 2020-12-29 DIAGNOSIS — E119 Type 2 diabetes mellitus without complications: Secondary | ICD-10-CM | POA: Diagnosis not present

## 2020-12-29 DIAGNOSIS — I4891 Unspecified atrial fibrillation: Secondary | ICD-10-CM | POA: Diagnosis not present

## 2020-12-29 DIAGNOSIS — I4819 Other persistent atrial fibrillation: Secondary | ICD-10-CM | POA: Diagnosis not present

## 2020-12-29 DIAGNOSIS — I10 Essential (primary) hypertension: Secondary | ICD-10-CM | POA: Diagnosis not present

## 2020-12-29 NOTE — Patient Instructions (Addendum)
Medication Instructions:  Your physician recommends that you continue on your current medications as directed. Please refer to the Current Medication list given to you today. *If you need a refill on your cardiac medications before your next appointment, please call your pharmacy*  Lab Work: You will get lab work the same day as your ECHO-please schedule with ECHO  If you have labs (blood work) drawn today and your tests are completely normal, you will receive your results only by: Marland Kitchen MyChart Message (if you have MyChart) OR . A paper copy in the mail If you have any lab test that is abnormal or we need to change your treatment, we will call you to review the results.  Testing/Procedures: Your physician has requested that you have an echocardiogram. Echocardiography is a painless test that uses sound waves to create images of your heart. It provides your doctor with information about the size and shape of your heart and how well your heart's chambers and valves are working. This procedure takes approximately one hour. There are no restrictions for this procedure.  Please schedule for ECHO  Follow-Up:  SEE INSTRUCTION LETTER   Cardiac electrophysiology: From cell to bedside (7th ed., pp. 7169-6789). [place], [place]: Elsevier.">  Cardiac Ablation Cardiac ablation is a procedure to destroy, or ablate, a small amount of heart tissue in very specific places. The heart has many electrical connections. Sometimes these connections are abnormal and can cause the heart to beat very fast or irregularly. Ablating some of the areas that cause problems can improve the heart's rhythm or return it to normal. Ablation may be done for people who:  Have Wolff-Parkinson-White syndrome.  Have fast heart rhythms (tachycardia).  Have taken medicines for an abnormal heart rhythm (arrhythmia) that were not effective or caused side effects.  Have a high-risk heartbeat that may be life-threatening. During the  procedure, a small incision is made in the neck or the groin, and a long, thin tube (catheter) is inserted into the incision and moved to the heart. Small devices (electrodes) on the tip of the catheter will send out electrical currents. A type of X-ray (fluoroscopy) will be used to help guide the catheter and to provide images of the heart. Tell a health care provider about:  Any allergies you have.  All medicines you are taking, including vitamins, herbs, eye drops, creams, and over-the-counter medicines.  Any problems you or family members have had with anesthetic medicines.  Any blood disorders you have.  Any surgeries you have had.  Any medical conditions you have, such as kidney failure.  Whether you are pregnant or may be pregnant. What are the risks? Generally, this is a safe procedure. However, problems may occur, including:  Infection.  Bruising and bleeding at the catheter insertion site.  Bleeding into the chest, especially into the sac that surrounds the heart. This is a serious complication.  Stroke or blood clots.  Damage to nearby structures or organs.  Allergic reaction to medicines or dyes.  Need for a permanent pacemaker if the normal electrical system is damaged. A pacemaker is a small computer that sends electrical signals to the heart and helps your heart beat normally.  The procedure not being fully effective. This may not be recognized until months later. Repeat ablation procedures are sometimes done. What happens before the procedure? Medicines Ask your health care provider about:  Changing or stopping your regular medicines. This is especially important if you are taking diabetes medicines or blood thinners.  Taking  medicines such as aspirin and ibuprofen. These medicines can thin your blood. Do not take these medicines unless your health care provider tells you to take them.  Taking over-the-counter medicines, vitamins, herbs, and  supplements. General instructions  Follow instructions from your health care provider about eating or drinking restrictions.  Plan to have someone take you home from the hospital or clinic.  If you will be going home right after the procedure, plan to have someone with you for 24 hours.  Ask your health care provider what steps will be taken to prevent infection. What happens during the procedure?  An IV will be inserted into one of your veins.  You will be given a medicine to help you relax (sedative).  The skin on your neck or groin will be numbed.  An incision will be made in your neck or your groin.  A needle will be inserted through the incision and into a large vein in your neck or groin.  A catheter will be inserted into the needle and moved to your heart.  Dye may be injected through the catheter to help your surgeon see the area of the heart that needs treatment.  Electrical currents will be sent from the catheter to ablate heart tissue in desired areas. There are three types of energy that may be used to do this: ? Heat (radiofrequency energy). ? Laser energy. ? Extreme cold (cryoablation).  When the tissue has been ablated, the catheter will be removed.  Pressure will be held on the insertion area to prevent a lot of bleeding.  A bandage (dressing) will be placed over the insertion area. The exact procedure may vary among health care providers and hospitals.   What happens after the procedure?  Your blood pressure, heart rate, breathing rate, and blood oxygen level will be monitored until you leave the hospital or clinic.  Your insertion area will be monitored for bleeding. You will need to lie still for a few hours to ensure that you do not bleed from the insertion area.  Do not drive for 24 hours or as long as told by your health care provider. Summary  Cardiac ablation is a procedure to destroy, or ablate, a small amount of heart tissue using an electrical  current. This procedure can improve the heart rhythm or return it to normal.  Tell your health care provider about any medical conditions you may have and all medicines you are taking to treat them.  This is a safe procedure, but problems may occur. Problems may include infection, bruising, damage to nearby organs or structures, or allergic reactions to medicines.  Follow your health care provider's instructions about eating and drinking before the procedure. You may also be told to change or stop some of your medicines.  After the procedure, do not drive for 24 hours or as long as told by your health care provider. This information is not intended to replace advice given to you by your health care provider. Make sure you discuss any questions you have with your health care provider. Document Revised: 08/02/2019 Document Reviewed: 08/02/2019 Elsevier Patient Education  [place].

## 2020-12-29 NOTE — Progress Notes (Signed)
Electrophysiology Office Follow up Visit Note:    Date:  12/29/2020   ID:  Edward Mcdaniel, DOB Feb 08, 1936, MRN [id number]  PCP:  Patient, No Pcp Per  [place] Cardiologist:  No primary care provider on file.  [facility] Electrophysiologist:  Vickie Epley, MD    Interval History:    Edward Mcdaniel is a 85 y.o. male who presents for a follow up visit.  I last saw the patient December 14, 2020 is a consult when he was hospitalized for atrial fibrillation and slow ventricular rates.  His long history of bradycardia.  When I met him I felt like the majority of his symptoms were related to his atrial fibrillation.  He previously had an atrial fibrillation ablation scheduled with the [facility] in [place] but would now like to have this done in [place].  His symptoms in the hospital were exertional dyspnea and angina that have been occurring for the last 2 to 3 months.  He plays golf regularly and had played 18 holes of golf the weekend met him in the hospital.  For his atrial fibrillation, he is previously taken flecainide and amiodarone but failed both of these.   Past Medical History:  Diagnosis Date  . Allergic rhinitis   . Asthma   . DM type 2 (diabetes mellitus, type 2) (HCC)    diet controlled  . HTN (hypertension)   . Hyperlipidemia   . LV dysfunction   . Persistent atrial fibrillation ([place])   . Squamous cell carcinoma of skin 10/02/2011   right arm - tx p bx  . Squamous cell carcinoma of skin 01/03/2014   in situ on right cheek - tx p bx  . Squamous cell carcinoma of skin 07/08/2017   in situ on left hand - tx p bx  . Squamous cell carcinoma of skin 03/25/2018   well differentiated on top of left hand - tx p bx  . Squamous cell carcinoma of skin 02/15/2020   well differentiated on left zygomatic area cx3, excision  . Squamous cell carcinoma of skin 02/15/2020   in situ on right mid antihelix cx3 21f    Past Surgical History:  Procedure Laterality Date   . HERNIA REPAIR     x 2  . KNEE SURGERY     bilateral  . LEFT HEART CATH AND CORONARY ANGIOGRAPHY N/A 12/14/2020   Procedure: LEFT HEART CATH AND CORONARY ANGIOGRAPHY;  Surgeon: HLeonie Man MD;  [facility];  Service: Cardiovascular;  Laterality: N/A;  . RECTAL SURGERY    . shoulder bone spur / tendon repair      Current Medications: Current Meds  Medication Sig  . albuterol (VENTOLIN HFA) 108 (90 Base) MCG/ACT inhaler Inhale into the lungs every 6 (six) hours as needed for wheezing or shortness of breath.  .Marland Kitchenapixaban (ELIQUIS) 5 MG TABS tablet Take 5 mg by mouth 2 (two) times daily.  . Carboxymethylcellulose Sod PF 1 % GEL Place 1 drop into both eyes at bedtime.  . cyclobenzaprine (FLEXERIL) 10 MG tablet Take 10 mg by mouth 3 (three) times daily as needed for muscle spasms.  . hydrochlorothiazide (MICROZIDE) 12.5 MG capsule Take 12.5 mg by mouth Daily.  .Marland Kitchenlisinopril (ZESTRIL) 40 MG tablet Take 40 mg by mouth daily.  .Marland Kitchenloratadine (CLARITIN) 10 MG tablet Take 10 mg by mouth daily as needed for allergies.  .Marland KitchenMENTHOL-METHYL SALICYLATE EX Apply 1 application topically 2 (two) times daily as needed (for  joint pain.).  Marland Kitchen metFORMIN (GLUCOPHAGE-XR) 500 MG 24 hr tablet Take 500 mg by mouth daily with breakfast.  . pravastatin (PRAVACHOL) 40 MG tablet Take 40 mg by mouth at bedtime.  Marland Kitchen Propylene Glycol (SYSTANE BALANCE) 0.6 % SOLN Place 1 drop into both eyes 4 (four) times daily as needed (dry eyes).  . tamsulosin (FLOMAX) 0.4 MG CAPS capsule Take 0.4 mg by mouth at bedtime.     Allergies:   Patient has no known allergies.   Social History   Socioeconomic History  . Marital status: Widowed    Spouse name: Not on file  . Number of children: Not on file  . Years of education: Not on file  . Highest education level: Not on file  Occupational History  . Occupation: retired > [employer]   Tobacco Use  . Smoking status: Former Smoker    Packs/day: 1.50    Years: 30.00     Pack years: 45.00    Types: Cigarettes    Quit date: 10/08/1987    Years since quitting: 33.2  . Smokeless tobacco: Former Systems developer    Types: Chew  Substance and Sexual Activity  . Alcohol use: Yes    Alcohol/week: 0.0 standard drinks    Comment: 1-2 shots of whiskey a few days a week  . Drug use: Never  . Sexual activity: Not on file  Other Topics Concern  . Not on file  Social History Narrative   veteran   Social Determinants of Health   Financial Resource Strain: Not on file  Food Insecurity: Not on file  Transportation Needs: Not on file  Physical Activity: Not on file  Stress: Not on file  Social Connections: Not on file     Family History: The patient's family history includes Heart disease in his brother and mother; Lung cancer in his mother.  ROS:   Please see the history of present illness.    All other systems reviewed and are negative.  EKGs/Labs/Other Studies Reviewed:    The following studies were reviewed today:  Echo 2017 "EF>55%, Mild AI, MR, Mild posterior MVP Echo 11/2020 EF 40-45%, mild LVH, severely reduced RV function, mild AI  EKG:  The ekg ordered today demonstrates atrial fibrillation with a ventricular rate of 74 bpm.  Single PVC is positive throughout the precordiu with an inferior axis  Recent Labs: 12/12/2020: B Natriuretic Peptide 321.5; Magnesium 1.9; TSH 0.977 12/13/2020: ALT 29; Hemoglobin 13.9; Platelets 126 12/14/2020: BUN 18; Creatinine, Ser 1.38; Potassium 3.8; Sodium 137  Recent Lipid Panel    Component Value Date/Time   CHOL 103 12/13/2020 0248   TRIG 31 12/13/2020 0248   HDL 46 12/13/2020 0248   CHOLHDL 2.2 12/13/2020 0248   VLDL 6 12/13/2020 0248   LDLCALC 51 12/13/2020 0248    Physical Exam:    VS:  BP 116/74   Pulse 74   Ht _0  (1.803 m)   Wt 190 lb (86.2 kg)   SpO2 96%   BMI 26.50 kg/m     Wt Readings from Last 3 Encounters:  12/29/20 190 lb (86.2 kg)  12/14/20 186 lb 11.7 oz (84.7 kg)  03/19/16 192 lb (87.1  kg)     GEN:  Well nourished, well developed in no acute distress HEENT: Normal NECK: No JVD; No carotid bruits LYMPHATICS: No lymphadenopathy CARDIAC: Irregularly irregular, no murmurs, rubs, gallops RESPIRATORY:  Clear to auscultation without rales, wheezing or rhonchi  ABDOMEN: Soft, non-tender, non-distended MUSCULOSKELETAL:  No edema; No deformity  SKIN: Warm and dry NEUROLOGIC:  Alert and oriented x 3 PSYCHIATRIC:  Normal affect   ASSESSMENT:    1. Persistent atrial fibrillation ([place])   2. Primary hypertension   3. Type 2 diabetes mellitus without complication, without long-term current use of insulin (HCC)    PLAN:    In order of problems listed above:  1. Persistent atrial fibrillation Symptomatic.  CHA2DS2-VASc of 5. Continue Eliquis for stroke prophylaxis.  Patient has a reported history from an outside echo of severe RV dysfunction.    Previously failed amiodarone and flecainide.  I discussed treatment options for his atrial fibrillation including alternative antiarrhythmic medications versus ablation therapy.  The patient is very active man and I believe would be a good candidate for invasive approach to managing his atrial fibrillation.  I discussed atrial fibrillation ablation procedure in detail during today's visit including the risks, efficacy and expected recovery time and he wishes to proceed with scheduling.  We will need to get an echocardiogram prior to the procedure.  We also need a CT PV protocol.  Risk, benefits, and alternatives to EP study and radiofrequency ablation for afib were also discussed in detail today. These risks include but are not limited to stroke, bleeding, vascular damage, tamponade, perforation, damage to the esophagus, lungs, and other structures, pulmonary vein stenosis, worsening renal function, and death. The patient understands these risk and wishes to proceed.  We will therefore proceed with catheter ablation at the next available  time.  Carto, ICE, anesthesia are requested for the procedure.  Will also obtain CT PV protocol prior to the procedure to exclude LAA thrombus and further evaluate atrial anatomy.  2.  Hypertension Controlled during today's visit   Total time spent with patient today 70 minutes. This includes reviewing records, evaluating the patient and coordinating care.   Medication Adjustments/Labs and Tests Ordered: Current medicines are reviewed at length with the patient today.  Concerns regarding medicines are outlined above.  No orders of the defined types were placed in this encounter.  No orders of the defined types were placed in this encounter.    Signed, Lars Mage, MD, [facility]  12/29/2020 2:31 PM    Electrophysiology [facility]

## 2021-01-11 ENCOUNTER — Telehealth: Payer: Self-pay | Admitting: Cardiology

## 2021-01-11 NOTE — Telephone Encounter (Signed)
Patient would like to know when he can remove his event monitor. Please advise.

## 2021-01-12 NOTE — Telephone Encounter (Signed)
Returned call to Pt.  Per shared estimate, Pt has probably worn his heart monitor for 22 days.  Pt has had trouble with itching with this monitor.  Advised Pt ok to take off monitor and return.  Pt thanked nurse.

## 2021-01-30 ENCOUNTER — Ambulatory Visit (HOSPITAL_COMMUNITY): Payer: No Typology Code available for payment source | Attending: Cardiology

## 2021-01-30 ENCOUNTER — Other Ambulatory Visit: Payer: Self-pay

## 2021-01-30 ENCOUNTER — Other Ambulatory Visit: Payer: No Typology Code available for payment source | Admitting: *Deleted

## 2021-01-30 DIAGNOSIS — I4819 Other persistent atrial fibrillation: Secondary | ICD-10-CM | POA: Diagnosis present

## 2021-01-30 LAB — CBC WITH DIFFERENTIAL/PLATELET
Basophils Absolute: 0 10*3/uL (ref 0.0–0.2)
Basos: 1 %
EOS (ABSOLUTE): 0.2 10*3/uL (ref 0.0–0.4)
Eos: 4 %
Hematocrit: 44.2 % (ref 37.5–51.0)
Hemoglobin: 15 g/dL (ref 13.0–17.7)
Immature Grans (Abs): 0 10*3/uL (ref 0.0–0.1)
Immature Granulocytes: 0 %
Lymphocytes Absolute: 1 10*3/uL (ref 0.7–3.1)
Lymphs: 17 %
MCH: 32.9 pg (ref 26.6–33.0)
MCHC: 33.9 g/dL (ref 31.5–35.7)
MCV: 97 fL (ref 79–97)
Monocytes Absolute: 0.5 10*3/uL (ref 0.1–0.9)
Monocytes: 9 %
Neutrophils Absolute: 3.9 10*3/uL (ref 1.4–7.0)
Neutrophils: 69 %
Platelets: 172 10*3/uL (ref 150–450)
RBC: 4.56 x10E6/uL (ref 4.14–5.80)
RDW: 13.2 % (ref 11.6–15.4)
WBC: 5.7 10*3/uL (ref 3.4–10.8)

## 2021-01-30 LAB — BASIC METABOLIC PANEL
BUN/Creatinine Ratio: 15 (ref 10–24)
BUN: 19 mg/dL (ref 8–27)
CO2: 24 mmol/L (ref 20–29)
Calcium: 9.2 mg/dL (ref 8.6–10.2)
Chloride: 98 mmol/L (ref 96–106)
Creatinine, Ser: 1.31 mg/dL — ABNORMAL HIGH (ref 0.76–1.27)
Glucose: 107 mg/dL — ABNORMAL HIGH (ref 65–99)
Potassium: 4.1 mmol/L (ref 3.5–5.2)
Sodium: 136 mmol/L (ref 134–144)
eGFR: 54 mL/min/{1.73_m2} — ABNORMAL LOW (ref >59)

## 2021-01-30 LAB — ECHOCARDIOGRAM COMPLETE
P 1/2 time: 715 msec
S' Lateral: 3.6 cm

## 2021-02-08 ENCOUNTER — Telehealth (HOSPITAL_COMMUNITY): Payer: Self-pay | Admitting: *Deleted

## 2021-02-08 NOTE — Telephone Encounter (Signed)
Reaching out to patient to offer assistance regarding upcoming cardiac imaging study; pt verbalizes understanding of appt date/time, parking situation and where to check in, pre-test NPO status, and verified current allergies; name and call back number provided for further questions should they arise  Delmos Velaquez RN Navigator Cardiac Imaging [facility] [phone] office [phone] cell  

## 2021-02-08 NOTE — Telephone Encounter (Signed)
Attempted to call patient regarding upcoming cardiac CT appointment. °Left message on voicemail with name and callback number ° °Momen Ham RN Navigator Cardiac Imaging °[facility] °[phone] Office °[phone] Cell ° °

## 2021-02-09 ENCOUNTER — Ambulatory Visit (HOSPITAL_COMMUNITY)
Admission: RE | Admit: 2021-02-09 | Discharge: 2021-02-09 | Disposition: A | Discharge status: Home / Self Care | Payer: No Typology Code available for payment source | Source: Ambulatory Visit | Attending: Cardiology | Admitting: Cardiology

## 2021-02-09 ENCOUNTER — Other Ambulatory Visit: Payer: Self-pay

## 2021-02-09 DIAGNOSIS — I4891 Unspecified atrial fibrillation: Secondary | ICD-10-CM

## 2021-02-09 MED ORDER — IOHEXOL 350 MG/ML SOLN
80.0000 mL | Freq: Once | INTRAVENOUS | Status: AC | PRN
  Administered 2021-02-09: 80 mL via INTRAVENOUS

## 2021-02-14 ENCOUNTER — Telehealth: Payer: Self-pay

## 2021-02-14 ENCOUNTER — Other Ambulatory Visit (HOSPITAL_COMMUNITY)
Admission: RE | Admit: 2021-02-14 | Discharge: 2021-02-14 | Disposition: A | Discharge status: Home / Self Care | Payer: No Typology Code available for payment source | Source: Ambulatory Visit | Attending: Cardiology | Admitting: Cardiology

## 2021-02-14 DIAGNOSIS — Z01812 Encounter for preprocedural laboratory examination: Secondary | ICD-10-CM | POA: Diagnosis present

## 2021-02-14 DIAGNOSIS — Z20822 Contact with and (suspected) exposure to covid-19: Secondary | ICD-10-CM | POA: Diagnosis not present

## 2021-02-14 LAB — SARS CORONAVIRUS 2 (TAT 6-24 HRS): SARS Coronavirus 2: NEGATIVE

## 2021-02-14 NOTE — Telephone Encounter (Signed)
Call placed to Pt.  Advised arrival time for procedure 02/16/2021 was changed to 8:30 am.  Pt indicates understanding.

## 2021-02-15 NOTE — Pre-Procedure Instructions (Signed)
Instructed patient on the following items: Arrival time 0830 Nothing to eat or drink after midnight No meds AM of procedure Responsible person to drive you home and stay with you for 24 hrs  Have you missed any doses of anti-coagulant Elquis- take both doses today

## 2021-02-16 ENCOUNTER — Ambulatory Visit (HOSPITAL_COMMUNITY)
Admission: RE | Admit: 2021-02-16 | Discharge: 2021-02-17 | Disposition: A | Discharge status: Home / Self Care | Payer: No Typology Code available for payment source | Attending: Cardiology | Admitting: Cardiology

## 2021-02-16 ENCOUNTER — Ambulatory Visit (HOSPITAL_COMMUNITY): Payer: No Typology Code available for payment source | Admitting: Certified Registered Nurse Anesthetist

## 2021-02-16 ENCOUNTER — Other Ambulatory Visit: Payer: Self-pay

## 2021-02-16 ENCOUNTER — Ambulatory Visit (HOSPITAL_COMMUNITY)
Admission: RE | Disposition: A | Discharge status: Home / Self Care | Payer: Self-pay | Source: Home / Self Care | Attending: Cardiology

## 2021-02-16 DIAGNOSIS — I4819 Other persistent atrial fibrillation: Secondary | ICD-10-CM | POA: Insufficient documentation

## 2021-02-16 DIAGNOSIS — E7800 Pure hypercholesterolemia, unspecified: Secondary | ICD-10-CM | POA: Diagnosis present

## 2021-02-16 DIAGNOSIS — Z7901 Long term (current) use of anticoagulants: Secondary | ICD-10-CM | POA: Diagnosis not present

## 2021-02-16 DIAGNOSIS — Z7984 Long term (current) use of oral hypoglycemic drugs: Secondary | ICD-10-CM | POA: Insufficient documentation

## 2021-02-16 DIAGNOSIS — E119 Type 2 diabetes mellitus without complications: Secondary | ICD-10-CM | POA: Diagnosis not present

## 2021-02-16 DIAGNOSIS — Z79899 Other long term (current) drug therapy: Secondary | ICD-10-CM | POA: Diagnosis not present

## 2021-02-16 DIAGNOSIS — I4891 Unspecified atrial fibrillation: Secondary | ICD-10-CM | POA: Diagnosis present

## 2021-02-16 DIAGNOSIS — I10 Essential (primary) hypertension: Secondary | ICD-10-CM | POA: Diagnosis not present

## 2021-02-16 DIAGNOSIS — Z87891 Personal history of nicotine dependence: Secondary | ICD-10-CM | POA: Insufficient documentation

## 2021-02-16 DIAGNOSIS — G4733 Obstructive sleep apnea (adult) (pediatric): Secondary | ICD-10-CM | POA: Diagnosis present

## 2021-02-16 HISTORY — PX: ATRIAL FIBRILLATION ABLATION: EP1191

## 2021-02-16 LAB — GLUCOSE, CAPILLARY
Glucose-Capillary: 112 mg/dL — ABNORMAL HIGH (ref 70–99)
Glucose-Capillary: 130 mg/dL — ABNORMAL HIGH (ref 70–99)

## 2021-02-16 LAB — POCT ACTIVATED CLOTTING TIME
Activated Clotting Time: 279 seconds
Activated Clotting Time: 303 seconds
Activated Clotting Time: 327 seconds

## 2021-02-16 SURGERY — ATRIAL FIBRILLATION ABLATION
Anesthesia: General

## 2021-02-16 MED ORDER — SUGAMMADEX SODIUM 200 MG/2ML IV SOLN
INTRAVENOUS | Status: DC | PRN
  Administered 2021-02-16: 200 mg via INTRAVENOUS

## 2021-02-16 MED ORDER — PROPOFOL 10 MG/ML IV BOLUS
INTRAVENOUS | Status: DC | PRN
  Administered 2021-02-16: 100 mg via INTRAVENOUS
  Administered 2021-02-16: 50 mg via INTRAVENOUS

## 2021-02-16 MED ORDER — METFORMIN HCL ER 500 MG PO TB24
500.0000 mg | ORAL_TABLET | Freq: Every day | ORAL | Status: DC
  Filled 2021-02-16: qty 1

## 2021-02-16 MED ORDER — PROTAMINE SULFATE 10 MG/ML IV SOLN
INTRAVENOUS | Status: DC | PRN
  Administered 2021-02-16: 5 mg via INTRAVENOUS
  Administered 2021-02-16: 10 mg via INTRAVENOUS
  Administered 2021-02-16: 20 mg via INTRAVENOUS
  Administered 2021-02-16: 10 mg via INTRAVENOUS

## 2021-02-16 MED ORDER — SODIUM CHLORIDE 0.9% FLUSH: 3.0000 mL | INTRAVENOUS | Status: DC | PRN

## 2021-02-16 MED ORDER — APIXABAN 5 MG PO TABS: 5.0000 mg | ORAL_TABLET | Freq: Two times a day (BID) | ORAL | Status: DC

## 2021-02-16 MED ORDER — PRAVASTATIN SODIUM 40 MG PO TABS
40.0000 mg | ORAL_TABLET | Freq: Every day | ORAL | Status: DC
  Administered 2021-02-16: 40 mg via ORAL
  Filled 2021-02-16: qty 1

## 2021-02-16 MED ORDER — ONDANSETRON HCL 4 MG/2ML IJ SOLN
INTRAMUSCULAR | Status: DC | PRN
  Administered 2021-02-16: 4 mg via INTRAVENOUS

## 2021-02-16 MED ORDER — HEPARIN (PORCINE) IN NACL 1000-0.9 UT/500ML-% IV SOLN
INTRAVENOUS | Status: DC | PRN
  Administered 2021-02-16 (×3): 500 mL

## 2021-02-16 MED ORDER — ALBUTEROL SULFATE (2.5 MG/3ML) 0.083% IN NEBU: 3.0000 mL | INHALATION_SOLUTION | Freq: Four times a day (QID) | RESPIRATORY_TRACT | Status: DC | PRN

## 2021-02-16 MED ORDER — CYCLOBENZAPRINE HCL 10 MG PO TABS: 10.0000 mg | ORAL_TABLET | Freq: Three times a day (TID) | ORAL | Status: DC | PRN

## 2021-02-16 MED ORDER — SODIUM CHLORIDE 0.9% FLUSH
3.0000 mL | Freq: Two times a day (BID) | INTRAVENOUS | Status: DC
  Administered 2021-02-17: 3 mL via INTRAVENOUS

## 2021-02-16 MED ORDER — PHENYLEPHRINE HCL (PRESSORS) 10 MG/ML IV SOLN
INTRAVENOUS | Status: DC | PRN
  Administered 2021-02-16: 80 ug via INTRAVENOUS

## 2021-02-16 MED ORDER — FENTANYL CITRATE (PF) 100 MCG/2ML IJ SOLN
INTRAMUSCULAR | Status: AC
  Filled 2021-02-16: qty 2

## 2021-02-16 MED ORDER — SODIUM CHLORIDE 0.9 % IV SOLN: INTRAVENOUS | Status: DC

## 2021-02-16 MED ORDER — TAMSULOSIN HCL 0.4 MG PO CAPS
0.4000 mg | ORAL_CAPSULE | Freq: Every day | ORAL | Status: DC
  Administered 2021-02-16: 0.4 mg via ORAL
  Filled 2021-02-16: qty 1

## 2021-02-16 MED ORDER — LIDOCAINE 2% (20 MG/ML) 5 ML SYRINGE
INTRAMUSCULAR | Status: DC | PRN
  Administered 2021-02-16: 20 mg via INTRAVENOUS

## 2021-02-16 MED ORDER — HEPARIN (PORCINE) IN NACL 1000-0.9 UT/500ML-% IV SOLN
INTRAVENOUS | Status: AC
  Filled 2021-02-16: qty 500

## 2021-02-16 MED ORDER — HYDROCHLOROTHIAZIDE 12.5 MG PO CAPS
12.5000 mg | ORAL_CAPSULE | Freq: Every morning | ORAL | Status: DC
  Administered 2021-02-17: 12.5 mg via ORAL
  Filled 2021-02-16: qty 1

## 2021-02-16 MED ORDER — HEPARIN SODIUM (PORCINE) 1000 UNIT/ML IJ SOLN
INTRAMUSCULAR | Status: AC
  Filled 2021-02-16: qty 1

## 2021-02-16 MED ORDER — HEPARIN SODIUM (PORCINE) 1000 UNIT/ML IJ SOLN
INTRAMUSCULAR | Status: DC | PRN
  Administered 2021-02-16 (×2): 3000 [IU] via INTRAVENOUS
  Administered 2021-02-16: 4000 [IU] via INTRAVENOUS
  Administered 2021-02-16: 12000 [IU] via INTRAVENOUS

## 2021-02-16 MED ORDER — DEXAMETHASONE SODIUM PHOSPHATE 10 MG/ML IJ SOLN
INTRAMUSCULAR | Status: DC | PRN
  Administered 2021-02-16: 5 mg via INTRAVENOUS

## 2021-02-16 MED ORDER — FENTANYL CITRATE (PF) 250 MCG/5ML IJ SOLN
INTRAMUSCULAR | Status: DC | PRN
  Administered 2021-02-16: 100 ug via INTRAVENOUS

## 2021-02-16 MED ORDER — ISOPROTERENOL HCL 0.2 MG/ML IJ SOLN
INTRAVENOUS | Status: DC | PRN
  Administered 2021-02-16: 2 ug/min via INTRAVENOUS

## 2021-02-16 MED ORDER — ISOPROTERENOL HCL 0.2 MG/ML IJ SOLN
INTRAMUSCULAR | Status: AC
  Filled 2021-02-16: qty 5

## 2021-02-16 MED ORDER — ONDANSETRON HCL 4 MG/2ML IJ SOLN: 4.0000 mg | Freq: Four times a day (QID) | INTRAMUSCULAR | Status: DC | PRN

## 2021-02-16 MED ORDER — ACETAMINOPHEN 325 MG PO TABS
650.0000 mg | ORAL_TABLET | ORAL | Status: DC | PRN
  Filled 2021-02-16: qty 2

## 2021-02-16 MED ORDER — LORATADINE 10 MG PO TABS
10.0000 mg | ORAL_TABLET | Freq: Every morning | ORAL | Status: DC
  Administered 2021-02-17: 10 mg via ORAL
  Filled 2021-02-16: qty 1

## 2021-02-16 MED ORDER — PANTOPRAZOLE SODIUM 40 MG PO TBEC
40.0000 mg | DELAYED_RELEASE_TABLET | Freq: Every day | ORAL | Status: DC
  Administered 2021-02-16 – 2021-02-17 (×2): 40 mg via ORAL
  Filled 2021-02-16 (×2): qty 1

## 2021-02-16 MED ORDER — PHENYLEPHRINE HCL-NACL 10-0.9 MG/250ML-% IV SOLN
INTRAVENOUS | Status: DC | PRN
  Administered 2021-02-16: 25 ug/min via INTRAVENOUS

## 2021-02-16 MED ORDER — FENTANYL CITRATE (PF) 100 MCG/2ML IJ SOLN
25.0000 ug | INTRAMUSCULAR | Status: DC | PRN
  Administered 2021-02-16 (×2): 25 ug via INTRAVENOUS

## 2021-02-16 MED ORDER — APIXABAN 5 MG PO TABS
5.0000 mg | ORAL_TABLET | Freq: Two times a day (BID) | ORAL | Status: DC
  Administered 2021-02-17: 5 mg via ORAL
  Filled 2021-02-16: qty 1

## 2021-02-16 MED ORDER — SODIUM CHLORIDE 0.9 % IV SOLN: 250.0000 mL | INTRAVENOUS | Status: DC | PRN

## 2021-02-16 MED ORDER — LISINOPRIL 20 MG PO TABS
40.0000 mg | ORAL_TABLET | Freq: Every morning | ORAL | Status: DC
  Administered 2021-02-17: 40 mg via ORAL
  Filled 2021-02-16: qty 2

## 2021-02-16 MED ORDER — HEPARIN SODIUM (PORCINE) 1000 UNIT/ML IJ SOLN
INTRAMUSCULAR | Status: DC | PRN
  Administered 2021-02-16: 1000 [IU] via INTRAVENOUS

## 2021-02-16 MED ORDER — ARTIFICIAL TEARS OPHTHALMIC OINT
1.0000 "application " | TOPICAL_OINTMENT | Freq: Every day | OPHTHALMIC | Status: DC
  Administered 2021-02-16: 1 via OPHTHALMIC
  Filled 2021-02-16: qty 3.5

## 2021-02-16 MED ORDER — ROCURONIUM BROMIDE 10 MG/ML (PF) SYRINGE
PREFILLED_SYRINGE | INTRAVENOUS | Status: DC | PRN
  Administered 2021-02-16: 70 mg via INTRAVENOUS

## 2021-02-16 SURGICAL SUPPLY — 20 items
BLANKET WARM UNDERBOD FULL ACC (MISCELLANEOUS) ×2 IMPLANT
CATH 8FR REPROCESSED SOUNDSTAR (CATHETERS) ×2 IMPLANT
CATH 8FR SOUNDSTAR REPROCESSED (CATHETERS) IMPLANT
CATH MAPPNG PENTARAY F 2-6-2MM (CATHETERS) IMPLANT
CATH S CIRCA THERM PROBE 10F (CATHETERS) ×1 IMPLANT
CATH SMTCH THERMOCOOL SF DF (CATHETERS) ×1 IMPLANT
CATH WEB BI DIR CSDF CRV REPRO (CATHETERS) ×1 IMPLANT
CLOSURE PERCLOSE PROSTYLE (VASCULAR PRODUCTS) ×2 IMPLANT
COVER SWIFTLINK CONNECTOR (BAG) ×2 IMPLANT
PACK EP LATEX FREE (CUSTOM PROCEDURE TRAY) ×2
PACK EP LF (CUSTOM PROCEDURE TRAY) ×1 IMPLANT
PAD PRO RADIOLUCENT 2001M-C (PAD) ×2 IMPLANT
PATCH CARTO3 (PAD) ×1 IMPLANT
PENTARAY F 2-6-2MM (CATHETERS) ×2
SHEATH BAYLIS TRANSSEPTAL 98CM (NEEDLE) ×1 IMPLANT
SHEATH CARTO VIZIGO SM CVD (SHEATH) ×1 IMPLANT
SHEATH PINNACLE 8F 10CM (SHEATH) ×1 IMPLANT
SHEATH PINNACLE 9F 10CM (SHEATH) ×1 IMPLANT
SHEATH PROBE COVER 6X72 (BAG) ×1 IMPLANT
TUBING SMART ABLATE COOLFLOW (TUBING) ×1 IMPLANT

## 2021-02-16 NOTE — H&P (Signed)
Electrophysiology Office Follow up Visit Note:    Date:  12/29/2020   ID:  Edward Mcdaniel, DOB 04-07-1936, MRN [id number]  PCP:  Patient, No Pcp Per      [place] Cardiologist:  No primary care provider on file.  [facility] Electrophysiologist:  Vickie Epley, MD    Interval History:    Edward Mcdaniel is a 86 y.o. male who presents for a follow up visit.  I last saw the patient December 14, 2020 is a consult when he was hospitalized for atrial fibrillation and slow ventricular rates.  His long history of bradycardia.  When I met him I felt like the majority of his symptoms were related to his atrial fibrillation.  He previously had an atrial fibrillation ablation scheduled with the [facility] in [place] but would now like to have this done in [place].  His symptoms in the hospital were exertional dyspnea and angina that have been occurring for the last 2 to 3 months.  He plays golf regularly and had played 18 holes of golf the weekend met him in the hospital.  For his atrial fibrillation, he is previously taken flecainide and amiodarone but failed both of these.   Past Medical History:  Diagnosis Date  . Allergic rhinitis   . Asthma   . DM type 2 (diabetes mellitus, type 2) (HCC)    diet controlled  . HTN (hypertension)   . Hyperlipidemia   . LV dysfunction   . Persistent atrial fibrillation ([place])   . Squamous cell carcinoma of skin 10/02/2011   right arm - tx p bx  . Squamous cell carcinoma of skin 01/03/2014   in situ on right cheek - tx p bx  . Squamous cell carcinoma of skin 07/08/2017   in situ on left hand - tx p bx  . Squamous cell carcinoma of skin 03/25/2018   well differentiated on top of left hand - tx p bx  . Squamous cell carcinoma of skin 02/15/2020   well differentiated on left zygomatic area cx3, excision  . Squamous cell carcinoma of skin 02/15/2020   in situ on right mid antihelix cx3 55f    Past Surgical  History:  Procedure Laterality Date  . HERNIA REPAIR     x 2  . KNEE SURGERY     bilateral  . LEFT HEART CATH AND CORONARY ANGIOGRAPHY N/A 12/14/2020   Procedure: LEFT HEART CATH AND CORONARY ANGIOGRAPHY;  Surgeon: HLeonie Man MD;  [facility];  Service: Cardiovascular;  Laterality: N/A;  . RECTAL SURGERY    . shoulder bone spur / tendon repair      Current Medications: Active Medications      Current Meds  Medication Sig  . albuterol (VENTOLIN HFA) 108 (90 Base) MCG/ACT inhaler Inhale into the lungs every 6 (six) hours as needed for wheezing or shortness of breath.  .Marland Kitchenapixaban (ELIQUIS) 5 MG TABS tablet Take 5 mg by mouth 2 (two) times daily.  . Carboxymethylcellulose Sod PF 1 % GEL Place 1 drop into both eyes at bedtime.  . cyclobenzaprine (FLEXERIL) 10 MG tablet Take 10 mg by mouth 3 (three) times daily as needed for muscle spasms.  . hydrochlorothiazide (MICROZIDE) 12.5 MG capsule Take 12.5 mg by mouth Daily.  .Marland Kitchenlisinopril (ZESTRIL) 40 MG tablet Take 40 mg by mouth daily.  .Marland Kitchenloratadine (CLARITIN) 10 MG tablet Take 10 mg by mouth daily as needed for allergies.  .Marland KitchenMENTHOL-METHYL SALICYLATE EX  Apply 1 application topically 2 (two) times daily as needed (for joint pain.).  Marland Kitchen metFORMIN (GLUCOPHAGE-XR) 500 MG 24 hr tablet Take 500 mg by mouth daily with breakfast.  . pravastatin (PRAVACHOL) 40 MG tablet Take 40 mg by mouth at bedtime.  Marland Kitchen Propylene Glycol (SYSTANE BALANCE) 0.6 % SOLN Place 1 drop into both eyes 4 (four) times daily as needed (dry eyes).  . tamsulosin (FLOMAX) 0.4 MG CAPS capsule Take 0.4 mg by mouth at bedtime.       Allergies:   Patient has no known allergies.   Social History        Socioeconomic History  . Marital status: Widowed    Spouse name: Not on file  . Number of children: Not on file  . Years of education: Not on file  . Highest education level: Not on file  Occupational History  . Occupation: retired >  [employer]   Tobacco Use  . Smoking status: Former Smoker    Packs/day: 1.50    Years: 30.00    Pack years: 45.00    Types: Cigarettes    Quit date: 10/08/1987    Years since quitting: 33.2  . Smokeless tobacco: Former Systems developer    Types: Chew  Substance and Sexual Activity  . Alcohol use: Yes    Alcohol/week: 0.0 standard drinks    Comment: 1-2 shots of whiskey a few days a week  . Drug use: Never  . Sexual activity: Not on file  Other Topics Concern  . Not on file  Social History Narrative   veteran   Social Determinants of Health   Financial Resource Strain: Not on file  Food Insecurity: Not on file  Transportation Needs: Not on file  Physical Activity: Not on file  Stress: Not on file  Social Connections: Not on file     Family History: The patient's family history includes Heart disease in his brother and mother; Lung cancer in his mother.  ROS:   Please see the history of present illness.    All other systems reviewed and are negative.  EKGs/Labs/Other Studies Reviewed:    The following studies were reviewed today:  Echo 2017 "EF>55%, Mild AI, MR, Mild posterior MVP Echo 11/2020 EF 40-45%, mild LVH, severely reduced RV function, mild AI  EKG:  The ekg ordered today demonstrates atrial fibrillation with a ventricular rate of 74 bpm.  Single PVC is positive throughout the precordiu with an inferior axis  Recent Labs: 12/12/2020: B Natriuretic Peptide 321.5; Magnesium 1.9; TSH 0.977 12/13/2020: ALT 29; Hemoglobin 13.9; Platelets 126 12/14/2020: BUN 18; Creatinine, Ser 1.38; Potassium 3.8; Sodium 137  Recent Lipid Panel Labs (Brief)          Component Value Date/Time   CHOL 103 12/13/2020 0248   TRIG 31 12/13/2020 0248   HDL 46 12/13/2020 0248   CHOLHDL 2.2 12/13/2020 0248   VLDL 6 12/13/2020 0248   LDLCALC 51 12/13/2020 0248      Physical Exam:    VS:  BP 116/74   Pulse 74   Ht 5' 11"  (1.803 m)   Wt 190 lb (86.2 kg)    SpO2 96%   BMI 26.50 kg/m        Wt Readings from Last 3 Encounters:  12/29/20 190 lb (86.2 kg)  12/14/20 186 lb 11.7 oz (84.7 kg)  03/19/16 192 lb (87.1 kg)     GEN:  Well nourished, well developed in no acute distress HEENT: Normal NECK: No JVD; No carotid bruits  LYMPHATICS: No lymphadenopathy CARDIAC: Irregularly irregular, no murmurs, rubs, gallops RESPIRATORY:  Clear to auscultation without rales, wheezing or rhonchi  ABDOMEN: Soft, non-tender, non-distended MUSCULOSKELETAL:  No edema; No deformity  SKIN: Warm and dry NEUROLOGIC:  Alert and oriented x 3 PSYCHIATRIC:  Normal affect   ASSESSMENT:    1. Persistent atrial fibrillation ([place])   2. Primary hypertension   3. Type 2 diabetes mellitus without complication, without long-term current use of insulin (HCC)    PLAN:    In order of problems listed above:  1. Persistent atrial fibrillation Symptomatic.  CHA2DS2-VASc of 5. Continue Eliquis for stroke prophylaxis.  Patient has a reported history from an outside echo of severe RV dysfunction.    Previously failed amiodarone and flecainide.  I discussed treatment options for his atrial fibrillation including alternative antiarrhythmic medications versus ablation therapy.  The patient is very active man and I believe would be a good candidate for invasive approach to managing his atrial fibrillation.  I discussed atrial fibrillation ablation procedure in detail during today's visit including the risks, efficacy and expected recovery time and he wishes to proceed with scheduling.  We will need to get an echocardiogram prior to the procedure.  We also need a CT PV protocol.  Risk, benefits, and alternatives to EP study and radiofrequency ablation for afib were also discussed in detail today. These risks include but are not limited to stroke, bleeding, vascular damage, tamponade, perforation, damage to the esophagus, lungs, and other structures, pulmonary vein  stenosis, worsening renal function, and death. The patient understands these risk and wishes to proceed.  We will therefore proceed with catheter ablation at the next available time.  Carto, ICE, anesthesia are requested for the procedure.  Will also obtain CT PV protocol prior to the procedure to exclude LAA thrombus and further evaluate atrial anatomy.  2.  Hypertension Controlled during today's visit    -------------------------------------------------------------------  I have seen, examined the patient, and reviewed the above assessment and plan.    Plan for PVI today. CT PV protocol shows normal LA anatomy. Echo shows normal LV/RV function.  Vickie Epley, MD 02/16/2021 8:28 AM

## 2021-02-16 NOTE — Anesthesia Preprocedure Evaluation (Addendum)
Anesthesia Evaluation  Patient identified by MRN, date of birth, ID band Patient awake    Reviewed: Allergy & Precautions, NPO status , Patient's Chart, lab work & pertinent test results  History of Anesthesia Complications Negative for: history of anesthetic complications  Airway Mallampati: II  TM Distance: >3 FB Neck ROM: Full    Dental  (+) Edentulous Upper, Dental Advisory Given, Caps   Pulmonary sleep apnea and Continuous Positive Airway Pressure Ventilation , COPD,  COPD inhaler, former smoker,  02/14/2021 SARS coronavirus NEG   breath sounds clear to auscultation       Cardiovascular hypertension, Pt. on medications (-) angina+ dysrhythmias  Rhythm:Irregular Rate:Normal  01/2021 ECHO: EF 55-60%, mild LVH, mild MR, mild TR 12/2020 Cath: minimal, non-obstructive ASCAD   Neuro/Psych negative neurological ROS     GI/Hepatic negative GI ROS, Neg liver ROS,   Endo/Other  diabetes, Oral Hypoglycemic Agents  Renal/GU negative Renal ROS     Musculoskeletal   Abdominal   Peds  Hematology Eliquis   Anesthesia Other Findings   Reproductive/Obstetrics                            Anesthesia Physical Anesthesia Plan  ASA: III  Anesthesia Plan: General   Post-op Pain Management:    Induction: Intravenous  PONV Risk Score and Plan: 2 and Ondansetron and Dexamethasone  Airway Management Planned: Oral ETT  Additional Equipment: None  Intra-op Plan:   Post-operative Plan: Extubation in OR  Informed Consent: I have reviewed the patients History and Physical, chart, labs and discussed the procedure including the risks, benefits and alternatives for the proposed anesthesia with the patient or authorized representative who has indicated his/her understanding and acceptance.     Dental advisory given  Plan Discussed with: CRNA and Surgeon  Anesthesia Plan Comments:        Anesthesia  Quick Evaluation

## 2021-02-16 NOTE — Anesthesia Postprocedure Evaluation (Signed)
Anesthesia Post Note  Patient: Edward Mcdaniel  Procedure(s) Performed: ATRIAL FIBRILLATION ABLATION (N/A )     Patient location during evaluation: Cath Lab Anesthesia Type: General Level of consciousness: awake and alert, patient cooperative and oriented Pain management: pain level controlled Vital Signs Assessment: post-procedure vital signs reviewed and stable Respiratory status: nonlabored ventilation, spontaneous breathing and respiratory function stable Cardiovascular status: blood pressure returned to baseline and stable Postop Assessment: no apparent nausea or vomiting Anesthetic complications: no   No complications documented.  Last Vitals:  Vitals:   02/16/21 1355 02/16/21 1400  BP: 115/78 139/70  Pulse: 65 65  Resp: 20 15  Temp:    SpO2: 96% 94%    Last Pain:  Vitals:   02/16/21 1233  TempSrc:   PainSc: 6                  Elisandra Deshmukh,E. Kailo Kosik

## 2021-02-16 NOTE — Progress Notes (Addendum)
Manual pressure held to left groin for 20 mins by Moishe Spice.  Dr [name] and Dr Ellyn Hack in to see groin.  Groin level 1 with brusing. Area marked and gauze and tegaderm applied.  Orders given for Femstop.  Femstop applied starting at 45 mm/hg.  R and L DP +2 palpable.

## 2021-02-16 NOTE — Transfer of Care (Signed)
Immediate Anesthesia Transfer of Care Note  Patient: Edward Mcdaniel  Procedure(s) Performed: ATRIAL FIBRILLATION ABLATION (N/A )  Patient Location: Cath Lab  Anesthesia Type:General  Level of Consciousness: awake, alert , patient cooperative and responds to stimulation  Airway & Oxygen Therapy: Patient Spontanous Breathing  Post-op Assessment: Report given to RN and Post -op Vital signs reviewed and stable  Post vital signs: Reviewed and stable  Last Vitals:  Vitals Value Taken Time  BP 140/68 02/16/21 1317  Temp    Pulse 61 02/16/21 1320  Resp 12 02/16/21 1320  SpO2 98 % 02/16/21 1320  Vitals shown include unvalidated device data.  Last Pain:  Vitals:   02/16/21 0837  TempSrc:   PainSc: 0-No pain      Patients Stated Pain Goal: 3 (21/19/41 7408)  Complications: No complications documented.

## 2021-02-16 NOTE — Anesthesia Procedure Notes (Signed)
Procedure Name: Intubation Date/Time: 02/16/2021 10:24 AM Performed by: Glynda Jaeger, CRNA Pre-anesthesia Checklist: Patient identified, Patient being monitored, Timeout performed, Emergency Drugs available and Suction available Patient Re-evaluated:Patient Re-evaluated prior to induction Oxygen Delivery Method: Circle System Utilized Preoxygenation: Pre-oxygenation with 100% oxygen Induction Type: IV induction Ventilation: Mask ventilation without difficulty Laryngoscope Size: Mac and 4 Grade View: Grade I Tube type: Oral Tube size: 7.5 mm Number of attempts: 1 Airway Equipment and Method: Stylet Placement Confirmation: ETT inserted through vocal cords under direct vision,  positive ETCO2 and breath sounds checked- equal and bilateral Secured at: 22 cm Tube secured with: Tape Dental Injury: Teeth and Oropharynx as per pre-operative assessment

## 2021-02-16 NOTE — Progress Notes (Signed)
Femstop decreased to 60mm/hg

## 2021-02-16 NOTE — Progress Notes (Signed)
EP Brief Note 4:00 PM  Patient's L groin w/ hematoma after the procedure. Pressure was held. Still some firmness medially. More superiorly and laterally, the area is softer now and not consistent with active extravasation.  Plan to hold pressure with fem-stop in the medial area of firmness for 45 minutes then slowly decrease pressure.  Will plan to restart eliquis tomorrow morning if groin is hemostatic.  Lysbeth Galas T. [name], MD, [facility], [facility] Cardiac Electrophysiology

## 2021-02-17 ENCOUNTER — Other Ambulatory Visit: Payer: Self-pay

## 2021-02-17 ENCOUNTER — Encounter (HOSPITAL_COMMUNITY): Payer: Self-pay | Admitting: Cardiology

## 2021-02-17 DIAGNOSIS — Z7901 Long term (current) use of anticoagulants: Secondary | ICD-10-CM | POA: Diagnosis not present

## 2021-02-17 DIAGNOSIS — I4819 Other persistent atrial fibrillation: Secondary | ICD-10-CM

## 2021-02-17 DIAGNOSIS — I10 Essential (primary) hypertension: Secondary | ICD-10-CM | POA: Diagnosis not present

## 2021-02-17 DIAGNOSIS — E119 Type 2 diabetes mellitus without complications: Secondary | ICD-10-CM | POA: Diagnosis not present

## 2021-02-17 MED ORDER — PANTOPRAZOLE SODIUM 40 MG PO TBEC: 40.0000 mg | DELAYED_RELEASE_TABLET | Freq: Every day | ORAL | 0 refills | Status: AC

## 2021-02-17 MED ORDER — HYDROCORTISONE 1 % EX CREA
1.0000 "application " | TOPICAL_CREAM | Freq: Three times a day (TID) | CUTANEOUS | Status: DC | PRN
  Filled 2021-02-17: qty 28

## 2021-02-17 NOTE — Plan of Care (Signed)
Assumed care of pt at 1900: POC discussed with pt who verbalizes understanding. Right groin site soft with no bleeding or hematoma, ecchymosis noted. Left groin with resolved hematoma, site soft, drainage noted on gauze dressing. Pt ambulated following bedrest with no distress or discomfort. Groin sites unchanged after ambulation. VSS, SR with 1degree HB on telemetry, I&Os as charted. WCTM.

## 2021-02-17 NOTE — Discharge Summary (Addendum)
Discharge Summary    Patient ID: Edward Mcdaniel MRN: [id number]; DOB: Mar 14, 1936  Admit date: 02/16/2021 Discharge date: 02/17/2021  PCP:  Cl[facility], [facility]   [facility] Providers Cardiologist:  None  Electrophysiologist:  Vickie Epley, MD       Discharge Diagnoses    Principal Problem:   Atrial fibrillation [facility]) Active Problems:   Diabetes mellitus type 2 in nonobese [facility])   HYPERCHOLESTEROLEMIA   Essential hypertension   OSA (obstructive sleep apnea)    Diagnostic Studies/Procedures    EP study Dr. [name] 02/16/2021  PREPROCEDURE DIAGNOSES: 1. Persistent atrial fibrillation  POSTPROCEDURE DIAGNOSES: 1. Persistent atrial fibrillation  PROCEDURES: 1. Pulmonary vein isolation (wide antral circumferential lesion set) 2. Posterior wall ablation and isolation  3. Esophageal temperature monitoring using the Circa temperature monitor 4. Three-dimensional electroanatomic mapping of atrial fibrillation 5. Intracardiac echocardiography 6. Transseptal puncture of an intact septum. 7. Comprehensive EP study with infusion of isuprel  CONCLUSIONS: 1. Successful PVI 2. Successful ablation/isolation of the posterior wall 3. Intracardiac echo reveals trivial pericardial effusion, normal LV function. 4. No early apparent complications. 5. Protonix 52m PO daily for 45 days _____________   History of Present Illness     Edward ODONOHUEis a 85y.o. male who presents for a follow up visit.  I last saw the patient December 14, 2020 is a consult when he was hospitalized for atrial fibrillation and slow ventricular rates.  His long history of bradycardia.  When I met him I felt like the majority of his symptoms were related to his atrial fibrillation.  [facility] [place]   His symptoms in the hospital were exertional dyspnea and angina that  have been occurring for the last 2 to 3 months.  He plays golf regularly and had played 18 holes of golf the weekend met him in the hospital.   For his atrial fibrillation, he is previously taken flecainide and amiodarone but failed both of these.  Hospital Course     Consultants: N/A  [facility] the planned atrial fibrillation ablation procedure.  He underwent the procedure on the same day with pulmonary vein isolation, three-dimensional mapping of atrial fibrillation and posterior wall ablation and isolation. Intracardiac echocardiogram done during the procedure revealed a trivial pericardial effusion and normal EF.  It was recommended he continue Protonix 40 mg daily for 45 days.  Postprocedure, patient did develop a left groin hematoma, however this was resolved with pressure.  Otherwise there was no significant complication from the procedure.  Patient was seen in the morning of 02/17/2021 at which time he felt well without chest pain or shortness of breath.  Telemetry continued to show sinus rhythm prior to discharge.  He has been restarted on Eliquis in the morning of 5/14.   Did the patient have an acute coronary syndrome (MI, NSTEMI, STEMI, etc) this admission?:  No                               Did the patient have a percutaneous coronary intervention (stent / angioplasty)?:  No.       _____________  Discharge Vitals Blood pressure 123/71, pulse 61, temperature 97.9 F (36.6 C), temperature source Oral, resp. rate 18, height 5' 10"  (1.778 m), weight 84.8 kg, SpO2  100 %.  Filed Weights   02/16/21 0918  Weight: 84.8 kg    Labs & Radiologic Studies    CBC No results for input(s): WBC, NEUTROABS, HGB, HCT, MCV, PLT in the last 72 hours. Basic Metabolic Panel No results for input(s): NA, K, CL, CO2, GLUCOSE, BUN, CREATININE, CALCIUM, MG, PHOS in the last 72 hours. Liver Function Tests No results for input(s): AST, ALT, ALKPHOS,  BILITOT, PROT, ALBUMIN in the last 72 hours. No results for input(s): LIPASE, AMYLASE in the last 72 hours. High Sensitivity Troponin:   No results for input(s): TROPONINIHS in the last 720 hours.  BNP Invalid input(s): POCBNP D-Dimer No results for input(s): DDIMER in the last 72 hours. Hemoglobin A1C No results for input(s): HGBA1C in the last 72 hours. Fasting Lipid Panel No results for input(s): CHOL, HDL, LDLCALC, TRIG, CHOLHDL, LDLDIRECT in the last 72 hours. Thyroid Function Tests No results for input(s): TSH, T4TOTAL, T3FREE, THYROIDAB in the last 72 hours.  Invalid input(s): FREET3 _____________  Cardiac event monitor  Result Date: 01/28/2021 HR 33-137 bpm, average 61 bpm. 97% burden of AF/AFL 2 pauses > 3 seconds Cameron T. [name], MD, [facility], [facility] Cardiac Electrophysiology  CT CARDIAC MORPH/PULM VEIN W/CM&W/O CA SCORE  Addendum Date: 02/09/2021   ADDENDUM REPORT: 02/09/2021 11:28 CLINICAL DATA:  Atrial fibrillation scheduled for ablation. EXAM: Cardiac CTA TECHNIQUE: A non-contrast, gated CT scan was obtained with axial slices of 3 mm through the heart for calcium scoring. Calcium scoring was performed using the Agatston method. A 120 kV retrospective, gated, contrast cardiac scan was obtained. Gantry rotation speed was 250 msecs and collimation was 0.6 mm. Nitroglycerin was not given. A delayed scan was obtained 60 seconds after contrast injection to exclude left atrial appendage thrombus. The 3D dataset was reconstructed in 5% intervals of the 0-95% of the R-R cycle. Systolic and diastolic phases were analyzed on a dedicated workstation using MPR, MIP, and VRT modes. The patient received 80 cc of contrast. FINDINGS: Image quality: Excellent Noise artifact is: Limited Pulmonary Veins: There is normal pulmonary vein drainage into the left atrium (2 on the right and 2 on the left) with ostial measurements as follows: RUPV: Ostium 26 x 22 mm  area 4.11 cm2 RLPV:  Ostium 27 x 22 mm  area  4.24 cm2 There is a 5 mm accessory middle right pulmonary vein distal to the ostium of RLPV. LUPV:  Ostium 26 x 17 mm area 3.51 cm2 LLPV:  Ostium 26 x 23 mm  area 4.31 cm2 Left Atrium: The left atrial size is normal. There is no PFO/ASD. The left atrial appendage is large wind sock type with ostial size 35 mm and length 28 mm. There is no thrombus in the left atrial appendage on contrast or delayed imaging. The esophagus runs in the left atrial midline in proximity to left lower pulmonary vein ostia. Coronary Arteries: CAC score of 4135, which is 72 percentile for age-, race-, and sex-matched controls. Normal coronary origin. Right dominance. The study was performed without use of NTG and is insufficient for plaque evaluation. Right Atrium: Right atrial size is within normal limits. Right Ventricle: The right ventricular cavity is within normal limits. Left Ventricle: The ventricular cavity size is within normal limits. Pericardium: Normal thickness with no significant effusion or calcium present. Pulmonary Artery: Mildly dilated 34 mm without proximal filling defect. Cardiac valves: The aortic valve is trileaflet without significant calcification. The mitral valve is normal structure without significant calcification. Aorta: Normal caliber with  aortic annular atherosclerosis as well as descending atherosclerosis. Extra-cardiac findings: See attached radiology report for non-cardiac structures. IMPRESSION: 1. There is normal pulmonary vein drainage into the left atrium with ostial measurements above. 2. There is no thrombus in the left atrial appendage. 3. The esophagus runs in the left atrial midline in proximity to left lower pulmonary vein ostia. 4. No PFO/ASD. 5. Normal coronary origin. Right dominance. 6. CAC score of 4135 which is 24 percentile for age-, race-, and sex-matched controls. Candee Furbish, MD Electronically Signed   By: Candee Furbish MD   On: 02/09/2021 11:28   Result Date: 02/09/2021 EXAM: OVER-READ  INTERPRETATION  CT CHEST The following report is an over-read performed by radiologist Dr. Vinnie Langton of [facility], [place] on 02/09/2021. This over-read does not include interpretation of cardiac or coronary anatomy or pathology. The coronary calcium score/coronary CTA interpretation by the cardiologist is attached. COMPARISON:  None. FINDINGS: Aortic atherosclerosis. Multiple tiny 2-4 mm subpleural pulmonary nodules are noted in the periphery of the right lung. Within the visualized portions of the thorax there are no other larger more suspicious appearing pulmonary nodules or masses, there is no acute consolidative airspace disease, no pleural effusions, no pneumothorax and no lymphadenopathy. Mild dilatation of the pulmonic trunk, concerning for potential pulmonary arterial hypertension. Visualized portions of the upper abdomen are unremarkable. There are no aggressive appearing lytic or blastic lesions noted in the visualized portions of the skeleton. IMPRESSION: 1. Multiple tiny pulmonary nodules in the periphery of the right lung, measuring 2-4 mm in size, strongly favored to represent benign subpleural lymph nodes. No follow-up needed if patient is low-risk (and has no known or suspected primary neoplasm). Non-contrast chest CT can be considered in 12 months if patient is high-risk. This recommendation follows the consensus statement: Guidelines for Management of Incidental Pulmonary Nodules Detected on CT Images: From the Fleischner Society 2017; Radiology 2017; 284:228-243. 2.  Aortic Atherosclerosis (ICD10-I70.0). Electronically Signed: By: Vinnie Langton M.D. On: 02/09/2021 08:36   ECHOCARDIOGRAM COMPLETE  Result Date: 01/30/2021    ECHOCARDIOGRAM REPORT   Patient Name:   Edward Mcdaniel Date of Exam: 01/30/2021 Medical Rec #:  [id number]        Height:       71.0 in Accession #:    [id number]       Weight:       190.0 lb Date of Birth:  September 05, 1936        BSA:          2.063 m Patient Age:     28 years         BP:           116/74 mmHg Patient Gender: M                HR:           65 bpm. Exam Location:  [place] Procedure: 2D Echo, Cardiac Doppler and Color Doppler Indications:    I48.91 Atrial Fibrillation  History:        Patient has no prior history of Echocardiogram examinations.                 Risk Factors:Hypertension, Diabetes and Dyslipidemia. Atrial                 Fibrillation.  Sonographer:    Cresenciano Lick RDCS Referring Phys: [id number] [place]  1. Left ventricular ejection fraction, by estimation, is 55 to 60%. The left ventricle  has normal function. The left ventricle has no regional wall motion abnormalities. There is mild concentric left ventricular hypertrophy. Left ventricular diastolic function could not be evaluated.  2. Right ventricular systolic function is normal. The right ventricular size is mildly enlarged.  3. Left atrial size was moderately dilated.  4. The mitral valve is normal in structure. Mild mitral valve regurgitation. No evidence of mitral stenosis.  5. The aortic valve is normal in structure. Aortic valve regurgitation is mild. No aortic stenosis is present.  6. There is mild dilatation of the aortic root, measuring 39 mm. There is mild dilatation of the ascending aorta, measuring 43 mm.  7. The inferior vena cava is normal in size with greater than 50% respiratory variability, suggesting right atrial pressure of 3 mmHg. FINDINGS  Left Ventricle: Left ventricular ejection fraction, by estimation, is 55 to 60%. The left ventricle has normal function. The left ventricle has no regional wall motion abnormalities. The left ventricular internal cavity size was normal in size. There is  mild concentric left ventricular hypertrophy. Left ventricular diastolic function could not be evaluated due to atrial fibrillation. Left ventricular diastolic function could not be evaluated. Right Ventricle: The right ventricular size is mildly  enlarged. No increase in right ventricular wall thickness. Right ventricular systolic function is normal. Left Atrium: Left atrial size was moderately dilated. Right Atrium: Right atrial size was normal in size. Pericardium: There is no evidence of pericardial effusion. Mitral Valve: The mitral valve is normal in structure. Mild mitral valve regurgitation. No evidence of mitral valve stenosis. Tricuspid Valve: The tricuspid valve is normal in structure. Tricuspid valve regurgitation is mild . No evidence of tricuspid stenosis. Aortic Valve: The aortic valve is normal in structure. Aortic valve regurgitation is mild. Aortic regurgitation PHT measures 715 msec. No aortic stenosis is present. Pulmonic Valve: The pulmonic valve was normal in structure. Pulmonic valve regurgitation is mild. No evidence of pulmonic stenosis. Aorta: The aortic root is normal in size and structure. There is mild dilatation of the aortic root, measuring 39 mm. There is mild dilatation of the ascending aorta, measuring 43 mm. Venous: The inferior vena cava is normal in size with greater than 50% respiratory variability, suggesting right atrial pressure of 3 mmHg. IAS/Shunts: No atrial level shunt detected by color flow Doppler.  LEFT VENTRICLE PLAX 2D LVIDd:         5.20 cm  Diastology LVIDs:         3.60 cm  LV e' medial:    5.28 cm/s LV PW:         1.30 cm  LV E/e' medial:  12.3 LV IVS:        1.30 cm  LV e' lateral:   7.45 cm/s LVOT diam:     2.30 cm  LV E/e' lateral: 8.7 LV SV:         56 LV SV Index:   27 LVOT Area:     4.15 cm  RIGHT VENTRICLE RV Basal diam:  4.20 cm RV S prime:     15.30 cm/s TAPSE (M-mode): 1.8 cm LEFT ATRIUM              Index       RIGHT ATRIUM           Index LA diam:        5.60 cm  2.71 cm/m  RA Area:     17.70 cm LA Vol (A2C):   110.0 ml 53.32 ml/m RA Volume:  53.60 ml  25.98 ml/m LA Vol (A4C):   80.6 ml  39.07 ml/m LA Biplane Vol: 94.2 ml  45.66 ml/m  AORTIC VALVE LVOT Vmax:   66.90 cm/s LVOT Vmean:   49.650 cm/s LVOT VTI:    0.136 m AI PHT:      715 msec  AORTA Ao Root diam: 3.90 cm Ao Asc diam:  4.30 cm MV E velocity: 65.13 cm/s  TRICUSPID VALVE                            TR Peak grad:   22.7 mmHg                            TR Vmax:        238.00 cm/s                             SHUNTS                            Systemic VTI:  0.14 m                            Systemic Diam: 2.30 cm Fransico Him MD Electronically signed by Fransico Him MD Signature Date/Time: 01/30/2021/1:25:22 PM    Final    Disposition   Pt is being discharged home today in good condition.  Follow-up Plans & Appointments     Follow-up Information     [facility] Follow up.   Specialty: Cardiology Why: 03/16/21 @ 10:00AM with R. Marlene Lard, PA-C Contact information: [address] [id number] mc [place] [phone]        Vickie Epley, MD Follow up.   Specialties: Cardiology, Radiology Why: 05/22/21 @ 10:30AM Contact information: [address] [place] [address] [phone]                   Discharge Medications   Allergies as of 02/17/2021       Reactions   Amlodipine Other (See Comments)   Nightmares   Tape Rash   Eruption, Erythema        Medication List     TAKE these medications    albuterol 108 (90 Base) MCG/ACT inhaler Commonly known as: VENTOLIN HFA Inhale 1-2 puffs into the lungs every 6 (six) hours as needed for wheezing or shortness of breath.   apixaban 5 MG Tabs tablet Commonly known as: ELIQUIS Take 5 mg by mouth 2 (two) times daily.   Carboxymethylcellulose Sod PF 1 % Gel Place 1 drop into both eyes at bedtime.   cyclobenzaprine 10 MG tablet Commonly known as: FLEXERIL Take 10 mg by mouth 3 (three) times daily as needed for muscle spasms.   hydrochlorothiazide 12.5 MG capsule Commonly known as: MICROZIDE Take 12.5 mg by mouth in the morning.   lisinopril 40 MG tablet Commonly known  as: ZESTRIL Take 40 mg by mouth in the morning.   loratadine 10 MG tablet Commonly known as: CLARITIN Take 10 mg by mouth in the morning.   MENTHOL-METHYL SALICYLATE EX Apply 1 application topically 2 (two) times daily as needed (for joint pain.).   metFORMIN 500 MG 24 hr tablet Commonly known as: GLUCOPHAGE-XR Take 500 mg by mouth in  the morning.   pantoprazole 40 MG tablet Commonly known as: PROTONIX Take 1 tablet (40 mg total) by mouth daily. Start taking on: Feb 18, 2021   pravastatin 40 MG tablet Commonly known as: PRAVACHOL Take 40 mg by mouth at bedtime.   Systane Balance 0.6 % Soln Generic drug: Propylene Glycol Place 1 drop into both eyes 4 (four) times daily as needed (dry eyes).   tamsulosin 0.4 MG Caps capsule Commonly known as: FLOMAX Take 0.4 mg by mouth at bedtime.           Outstanding Labs/Studies   N/A  Duration of Discharge Encounter   Greater than 30 minutes including physician time.  Hilbert Corrigan, [place] 02/17/2021, 11:59 AM    I have seen and examined this patient with Almyra Deforest.  Agree with above, note added to reflect my findings.  On exam, RRR, no murmurs.  Patient status post atrial fibrillation ablation.  Unfortunately had a left groin hematoma.  Groin site is soft today.  Okay for discharge.  Can resume anticoagulation today.  Aseel Uhde M. Avynn Klassen MD 02/17/2021 12:24 PM

## 2021-02-17 NOTE — Progress Notes (Signed)
Progress Note  Patient Name: Edward Mcdaniel Date of Encounter: 02/17/2021  [facility] Cardiologist: [name]  Subjective   Feeling well without chest pain or shortness of breath atrial fibrillation ablation yesterday complicated by left groin hematoma.  Patient has no pain at the groin site.  Inpatient Medications    Scheduled Meds: . apixaban  5 mg Oral BID  . artificial tears  1 application Both Eyes QHS  . hydrochlorothiazide  12.5 mg Oral q AM  . lisinopril  40 mg Oral q AM  . loratadine  10 mg Oral q AM  . metFORMIN  500 mg Oral Q breakfast  . pantoprazole  40 mg Oral Daily  . pravastatin  40 mg Oral QHS  . sodium chloride flush  3 mL Intravenous Q12H  . tamsulosin  0.4 mg Oral QHS   Continuous Infusions: . sodium chloride     PRN Meds: sodium chloride, acetaminophen, albuterol, cyclobenzaprine, ondansetron (ZOFRAN) IV, sodium chloride flush   Vital Signs    Vitals:   02/16/21 2007 02/16/21 2301 02/17/21 0341 02/17/21 0829  BP: 123/73 132/81 129/63 123/71  Pulse: 77 64 (!) 58 61  Resp: (!) 21 14 18 18   Temp: 98.3 F (36.8 C)  97.6 F (36.4 C) 97.9 F (36.6 C)  TempSrc: Oral  Oral Oral  SpO2: 95% 97% 97% 100%  Weight:      Height:        Intake/Output Summary (Last 24 hours) at 02/17/2021 0913 Last data filed at 02/16/2021 2314 Gross per 24 hour  Intake 850 ml  Output 275 ml  Net 575 ml   Last 3 Weights 02/16/2021 12/29/2020 12/14/2020  Weight (lbs) 187 lb 190 lb 186 lb 11.7 oz  Weight (kg) 84.823 kg 86.183 kg 84.7 kg      Telemetry    Sinus rhythm- Personally Reviewed  ECG    Sinus rhythm- Personally Reviewed  Physical Exam   GEN: Well nourished, well developed, in no acute distress  HEENT: normal  Neck: no JVD, carotid bruits, or masses Cardiac: RRR; no murmurs, rubs, or gallops,no edema  Respiratory:  clear to auscultation bilaterally, normal work of breathing GI: soft, nontender, nondistended, + BS MS: no deformity or atrophy   Skin: warm and dry, hematoma left groin soft, nontender Neuro:  Strength and sensation are intact Psych: euthymic mood, full affect   Labs    High Sensitivity Troponin:  No results for input(s): TROPONINIHS in the last 720 hours.    ChemistryNo results for input(s): NA, K, CL, CO2, GLUCOSE, BUN, CREATININE, CALCIUM, PROT, ALBUMIN, AST, ALT, ALKPHOS, BILITOT, GFRNONAA, GFRAA, ANIONGAP in the last 168 hours.   HematologyNo results for input(s): WBC, RBC, HGB, HCT, MCV, MCH, MCHC, RDW, PLT in the last 168 hours.  BNPNo results for input(s): BNP, PROBNP in the last 168 hours.   DDimer No results for input(s): DDIMER in the last 168 hours.   Radiology    No results found.  Cardiac Studies   TTE 01/30/2021 1. Left ventricular ejection fraction, by estimation, is 55 to 60%. The  left ventricle has normal function. The left ventricle has no regional  wall motion abnormalities. There is mild concentric left ventricular  hypertrophy. Left ventricular diastolic  function could not be evaluated.  2. Right ventricular systolic function is normal. The right ventricular  size is mildly enlarged.  3. Left atrial size was moderately dilated.  4. The mitral valve is normal in structure. Mild mitral valve  regurgitation. No  evidence of mitral stenosis.  5. The aortic valve is normal in structure. Aortic valve regurgitation is  mild. No aortic stenosis is present.  6. There is mild dilatation of the aortic root, measuring 39 mm. There is  mild dilatation of the ascending aorta, measuring 43 mm.  7. The inferior vena cava is normal in size with greater than 50%  respiratory variability, suggesting right atrial pressure of 3 mmHg.   Patient Profile     85 y.o. male status post A. fib ablation with a left groin hematoma  Assessment & Plan    1.  Persistent atrial fibrillation: Status post ablation yesterday.  Remains in sinus rhythm.  Okay for discharge today on home  medications.  2.  Left groin hematoma: Hematoma is soft.  I have removed his figure-of-eight sutures from both the left and right groin.  For questions or updates, please contact [place] Please consult [email or web address] for contact info under        Signed, Lory Nowaczyk Meredith Leeds, MD  02/17/2021, 9:13 AM

## 2021-02-17 NOTE — Progress Notes (Signed)
Informed by CCMD that pt had a 11 runs of IVR

## 2021-02-17 NOTE — Discharge Instructions (Signed)
Post procedure care instructions No driving for 4 days. No lifting over 5 lbs for 1 week. No vigorous or sexual activity for 1 week. You may return to work/your usual activities on 02/24/21. Keep procedure site clean & dry. If you notice increased pain, swelling, bleeding or pus, call/return!  You may shower after 24 hours, but no soaking in baths/hot tubs/pools for 1 week.  You have an appointment set up with the [facility].  Multiple studies have shown that being followed by a dedicated atrial fibrillation clinic in addition to the standard care you receive from your other physicians improves health. We believe that enrollment in the atrial fibrillation clinic will allow [place] to better care for you.   The phone number to the [facility] is [phone]. The clinic is staffed Monday through Friday from 8:30am to 5pm.  Parking Directions: The clinic is located in the Heart and Vascular Building connected to [facility]. 1)From [address] turn on to [address] and go to the 3rd entrance  (Heart and Vascular entrance) on the right. 2)Look to the right for Heart &Vascular Parking Garage. 3)A code for the entrance is required, for June is 4233.   4)Take the elevators to the 1st floor. Registration is in the room with the glass walls at the end of the hallway.  If you have any trouble parking or locating the clinic, please don't hesitate to call [phone].   Heart-Healthy Eating Plan Heart-healthy meal planning includes:  Eating less unhealthy fats.  Eating more healthy fats.  Making other changes in your diet. Talk with your doctor or a diet specialist (dietitian) to create an eating plan that is right for you. What is my plan? Your doctor may recommend an eating plan that includes:  Total fat: ______% or less of total calories a day.  Saturated fat: ______% or less of total calories a day.  Cholesterol: less than _________mg a day. What are  tips for following this plan? Cooking Avoid frying your food. Try to bake, boil, grill, or broil it instead. You can also reduce fat by:  Removing the skin from poultry.  Removing all visible fats from meats.  Steaming vegetables in water or broth. Meal planning  At meals, divide your plate into four equal parts: ? Fill one-half of your plate with vegetables and green salads. ? Fill one-fourth of your plate with whole grains. ? Fill one-fourth of your plate with lean protein foods.  Eat 4-5 servings of vegetables per day. A serving of vegetables is: ? 1 cup of raw or cooked vegetables. ? 2 cups of raw leafy greens.  Eat 4-5 servings of fruit per day. A serving of fruit is: ? 1 medium whole fruit. ?  cup of dried fruit. ?  cup of fresh, frozen, or canned fruit. ?  cup of 100% fruit juice.  Eat more foods that have soluble fiber. These are apples, broccoli, carrots, beans, peas, and barley. Try to get 20-30 g of fiber per day.  Eat 4-5 servings of nuts, legumes, and seeds per week: ? 1 serving of dried beans or legumes equals  cup after being cooked. ? 1 serving of nuts is  cup. ? 1 serving of seeds equals 1 tablespoon.   General information  Eat more home-cooked food. Eat less restaurant, buffet, and fast food.  Limit or avoid alcohol.  Limit foods that are high in starch and sugar.  Avoid fried foods.  Lose weight if you are  overweight.  Keep track of how much salt (sodium) you eat. This is important if you have high blood pressure. Ask your doctor to tell you more about this.  Try to add vegetarian meals each week. Fats  Choose healthy fats. These include olive oil and canola oil, flaxseeds, walnuts, almonds, and seeds.  Eat more omega-3 fats. These include salmon, mackerel, sardines, tuna, flaxseed oil, and ground flaxseeds. Try to eat fish at least 2 times each week.  Check food labels. Avoid foods with trans fats or high amounts of saturated fat.  Limit  saturated fats. ? These are often found in animal products, such as meats, butter, and cream. ? These are also found in plant foods, such as palm oil, palm kernel oil, and coconut oil.  Avoid foods with partially hydrogenated oils in them. These have trans fats. Examples are stick margarine, some tub margarines, cookies, crackers, and other baked goods. What foods can I eat? Fruits All fresh, canned (in natural juice), or frozen fruits. Vegetables Fresh or frozen vegetables (raw, steamed, roasted, or grilled). Green salads. Grains Most grains. Choose whole wheat and whole grains most of the time. Rice and pasta, including brown rice and pastas made with whole wheat. Meats and other proteins Lean, well-trimmed beef, veal, pork, and lamb. Chicken and [place] without skin. All fish and shellfish. Wild duck, rabbit, pheasant, and venison. Egg whites or low-cholesterol egg substitutes. Dried beans, peas, lentils, and tofu. Seeds and most nuts. Dairy Low-fat or nonfat cheeses, including ricotta and mozzarella. Skim or 1% milk that is liquid, powdered, or evaporated. Buttermilk that is made with low-fat milk. Nonfat or low-fat yogurt. Fats and oils Non-hydrogenated (trans-free) margarines. Vegetable oils, including soybean, sesame, sunflower, olive, peanut, safflower, corn, canola, and cottonseed. Salad dressings or mayonnaise made with a vegetable oil. Beverages Mineral water. Coffee and tea. Diet carbonated beverages. Sweets and desserts Sherbet, gelatin, and fruit ice. Small amounts of dark chocolate. Limit all sweets and desserts. Seasonings and condiments All seasonings and condiments. The items listed above may not be a complete list of foods and drinks you can eat. Contact a dietitian for more options. What foods should I avoid? Fruits Canned fruit in heavy syrup. Fruit in cream or butter sauce. Fried fruit. Limit coconut. Vegetables Vegetables cooked in cheese, cream, or butter sauce.  Fried vegetables. Grains Breads that are made with saturated or trans fats, oils, or whole milk. Croissants. Sweet rolls. Donuts. High-fat crackers, such as cheese crackers. Meats and other proteins Fatty meats, such as hot dogs, ribs, sausage, bacon, rib-eye roast or steak. High-fat deli meats, such as salami and bologna. Caviar. Domestic duck and goose. Organ meats, such as liver. Dairy Cream, sour cream, cream cheese, and creamed cottage cheese. Whole-milk cheeses. Whole or 2% milk that is liquid, evaporated, or condensed. Whole buttermilk. Cream sauce or high-fat cheese sauce. Yogurt that is made from whole milk. Fats and oils Meat fat, or shortening. Cocoa butter, hydrogenated oils, palm oil, coconut oil, palm kernel oil. Solid fats and shortenings, including bacon fat, salt pork, lard, and butter. Nondairy cream substitutes. Salad dressings with cheese or sour cream. Beverages Regular sodas and juice drinks with added sugar. Sweets and desserts Frosting. Pudding. Cookies. Cakes. Pies. Milk chocolate or white chocolate. Buttered syrups. Full-fat ice cream or ice cream drinks. The items listed above may not be a complete list of foods and drinks to avoid. Contact a dietitian for more information. Summary  Heart-healthy meal planning includes eating less unhealthy fats, eating  more healthy fats, and making other changes in your diet.  Eat a balanced diet. This includes fruits and vegetables, low-fat or nonfat dairy, lean protein, nuts and legumes, whole grains, and heart-healthy oils and fats. This information is not intended to replace advice given to you by your health care provider. Make sure you discuss any questions you have with your health care provider. Document Revised: 11/27/2017 Document Reviewed: 10/31/2017 Elsevier Patient Education  2021 [employer].

## 2021-02-19 ENCOUNTER — Encounter (HOSPITAL_COMMUNITY): Payer: Self-pay | Admitting: Cardiology

## 2021-02-19 MED FILL — Heparin Sodium (Porcine) Inj 1000 Unit/ML: INTRAMUSCULAR | Qty: 10 | Status: AC

## 2021-02-19 MED FILL — Heparin Sod (Porcine)-NaCl IV Soln 1000 Unit/500ML-0.9%: INTRAVENOUS | Qty: 1000 | Status: AC

## 2021-02-19 MED FILL — Fentanyl Citrate Preservative Free (PF) Inj 100 MCG/2ML: INTRAMUSCULAR | Qty: 2 | Status: AC

## 2021-02-20 ENCOUNTER — Telehealth: Payer: Self-pay | Admitting: Cardiology

## 2021-02-20 NOTE — Telephone Encounter (Signed)
Patient is requesting to speak with Dr. Mardene Speak nurse. He would like to know if it is too soon for him to play golf. He states he recalls the nurse telling him not to do anything at all 10 days post-op, but he wants to make sure.

## 2021-02-20 NOTE — Telephone Encounter (Signed)
Returned call to Pt.  Advised Pt NOT golf for 7 days post ablation.  All questions answered.

## 2021-02-23 ENCOUNTER — Telehealth: Payer: Self-pay | Admitting: Cardiology

## 2021-02-23 NOTE — Telephone Encounter (Signed)
Left message for patient to call back  

## 2021-02-23 NOTE — Telephone Encounter (Signed)
After speaking with the patient he verbalized that the area has gotten smaller since his ablation but he was concerned that it was still present. I advised him that it could take a month or longer to go away. IF the area starts to get bigger that is cause for concern. Patient verbalized understanding.

## 2021-02-23 NOTE — Telephone Encounter (Signed)
PT CI WANTING TO SPEAK TO THE NURSE, HAS A 2-INCH KNOT IN HIS GROIN SINCE WEDNESDAY.

## 2021-02-23 NOTE — Telephone Encounter (Signed)
Patient returning call.

## 2021-03-09 ENCOUNTER — Telehealth: Payer: Self-pay | Admitting: Cardiology

## 2021-03-09 NOTE — Telephone Encounter (Signed)
STAT if patient feels like he/she is going to faint   1) Are you dizzy now? Yes   2) Do you feel faint or have you passed out? No/Yes  3) Do you have any other symptoms? Feels very weird, lightheaded  4) Have you checked your HR and BP (record if available)? 105/64; 71

## 2021-03-09 NOTE — Telephone Encounter (Signed)
Pt post Afib ablation by Dr [name] on 02/16/2021.  Spoke with pt who is complaining of dizziness and "feeling weird' upon standing or bending over since ablation. Pt states BP has been running 105-111/55-74 with HR in the 70's.    Pt taking medications as prescribed.  Requested pt take BP while sitting and then standing with RN on the phone.  Sitting BP reported by pt is 130/71 HR-76.  Standing BP reported by pt is 102/65 HR 79.  Will forward information to Dr [name] for review and recommendation.  Pt advised to change positions slowly.  Reviewed ED precautions.  Pt verbalizes understanding and agrees with current plan.

## 2021-03-16 ENCOUNTER — Ambulatory Visit (HOSPITAL_COMMUNITY): Payer: No Typology Code available for payment source | Admitting: Physician Assistant

## 2021-05-09 ENCOUNTER — Ambulatory Visit: Payer: No Typology Code available for payment source | Admitting: Dermatology

## 2021-05-22 ENCOUNTER — Ambulatory Visit (INDEPENDENT_AMBULATORY_CARE_PROVIDER_SITE_OTHER): Payer: No Typology Code available for payment source | Admitting: Cardiology

## 2021-05-22 ENCOUNTER — Encounter: Payer: Self-pay | Admitting: Cardiology

## 2021-05-22 ENCOUNTER — Other Ambulatory Visit: Payer: Self-pay

## 2021-05-22 VITALS — BP 128/78 | HR 74 | Ht 70.0 in | Wt 175.4 lb

## 2021-05-22 DIAGNOSIS — I4819 Other persistent atrial fibrillation: Secondary | ICD-10-CM

## 2021-05-22 DIAGNOSIS — I10 Essential (primary) hypertension: Secondary | ICD-10-CM

## 2021-05-22 NOTE — Patient Instructions (Signed)
Medication Instructions:  Your physician recommends that you continue on your current medications as directed. Please refer to the Current Medication list given to you today.  Labwork: None ordered.  Testing/Procedures: None ordered.  Follow-Up: Your physician wants you to follow-up in: [facility] with contact you.     Any Other Special Instructions Will Be Listed Below (If Applicable).  If you need a refill on your cardiac medications before your next appointment, please call your pharmacy.   Tikosyn (Dofetilide) Hospital Admission  Prior to day of admission: Check with drug insurance company for cost of drug to ensure affordability --- Dofetilide 500 mcg twice a day.  GoodRx is an option if insurance copay is unaffordable.  All patients are tested for COVID-19 prior to admission.  No Benadryl is allowed 3 days prior to admission.  Please ensure no missed doses of your anticoagulation (blood thinner) for 3 weeks prior to admission. If a dose is missed please notify our office immediately.  A pharmacist will review all your medications for potential interactions with Tikosyn. If any medication changes are needed prior to admission we will be in touch with you.  If any new medications are started AFTER your admission date is set with Nurse, adult. Please notify our office immediately so your medication list can be updated and reviewed by our pharmacist again. On day of admission: Tikosyn initiation requires a 3 night/4 day hospital stay with constant telemetry monitoring. You will have an EKG after each dose of Tikosyn as well as daily lab draws.  If the drug does not convert you to normal rhythm a cardioversion after the 4th dose of Tikosyn.  Afib Clinic office visit on the morning of admission is needed for preliminary labs/ekg.  Time of admission is dependent on bed availability in the hospital. In some instances, you will be sent home until bed is available. Rarely  admission can be delayed to the following day if hospital census prevents available beds.  You may bring personal belongings/clothing with you to the hospital. Please leave your suitcase in the car until you arrive in admissions.  Questions please call our office at [phone]

## 2021-05-22 NOTE — Progress Notes (Signed)
Electrophysiology Office Follow up Visit Note:    Date:  05/22/2021   ID:  Edward Mcdaniel, DOB Dec 16, 1935, MRN [id number]  PCP:  Clinic, [facility] Cardiologist:  None  [facility] Electrophysiologist:  Vickie Epley, MD    Interval History:    Edward Mcdaniel is a 85 y.o. male who presents for a follow up visit after his AF ablation on 02/16/2021. During the procedure the Pvs were isolated and the posterior wall was ablated.  Today he tells me he is doing well.  He was able to walk 18 holes of golf twice last week.  He feels like his energy level is improved.  He cannot tell that he is out of rhythm.     Past Medical History:  Diagnosis Date   Allergic rhinitis    Asthma    DM type 2 (diabetes mellitus, type 2) ([place])    diet controlled   HTN (hypertension)    Hyperlipidemia    LV dysfunction    Persistent atrial fibrillation (HCC)    Squamous cell carcinoma of skin 10/02/2011   right arm - tx p bx   Squamous cell carcinoma of skin 01/03/2014   in situ on right cheek - tx p bx   Squamous cell carcinoma of skin 07/08/2017   in situ on left hand - tx p bx   Squamous cell carcinoma of skin 03/25/2018   well differentiated on top of left hand - tx p bx   Squamous cell carcinoma of skin 02/15/2020   well differentiated on left zygomatic area cx3, excision   Squamous cell carcinoma of skin 02/15/2020   in situ on right mid antihelix cx3 65f    Past Surgical History:  Procedure Laterality Date   ATRIAL FIBRILLATION ABLATION N/A 02/16/2021   Procedure: [place]  Surgeon: LVickie Epley MD;  [facility];  Service: Cardiovascular;  Laterality: N/A;   HERNIA REPAIR     x 2   KNEE SURGERY     bilateral   LEFT HEART CATH AND CORONARY ANGIOGRAPHY N/A 12/14/2020   Procedure: LEFT HEART CATH AND CORONARY ANGIOGRAPHY;  Surgeon: HLeonie Man MD;  [facility];  Service: Cardiovascular;   Laterality: N/A;   RECTAL SURGERY     shoulder bone spur / tendon repair      Current Medications: Current Meds  Medication Sig   albuterol (VENTOLIN HFA) 108 (90 Base) MCG/ACT inhaler Inhale 1-2 puffs into the lungs every 6 (six) hours as needed for wheezing or shortness of breath.   apixaban (ELIQUIS) 5 MG TABS tablet Take 5 mg by mouth 2 (two) times daily.   Carboxymethylcellulose Sod PF 1 % GEL Place 1 drop into both eyes at bedtime.   cyclobenzaprine (FLEXERIL) 10 MG tablet Take 10 mg by mouth 3 (three) times daily as needed for muscle spasms.   hydrochlorothiazide (MICROZIDE) 12.5 MG capsule Take 12.5 mg by mouth in the morning.   lisinopril (ZESTRIL) 40 MG tablet Take 40 mg by mouth in the morning.   loratadine (CLARITIN) 10 MG tablet Take 10 mg by mouth in the morning.   MENTHOL-METHYL SALICYLATE EX Apply 1 application topically 2 (two) times daily as needed (for joint pain.).   metFORMIN (GLUCOPHAGE-XR) 500 MG 24 hr tablet Take 500 mg by mouth in the morning.   mirtazapine (REMERON) 15 MG tablet Take 0.5 tablets by mouth at bedtime.   pantoprazole (PROTONIX) 40 MG tablet  Take 1 tablet (40 mg total) by mouth daily.   pravastatin (PRAVACHOL) 40 MG tablet Take 40 mg by mouth at bedtime.   Propylene Glycol (SYSTANE BALANCE) 0.6 % SOLN Place 1 drop into both eyes 4 (four) times daily as needed (dry eyes).   tamsulosin (FLOMAX) 0.4 MG CAPS capsule Take 0.4 mg by mouth at bedtime.     Allergies:   Amlodipine and Tape   Social History   Socioeconomic History   Marital status: Widowed    Spouse name: Not on file   Number of children: Not on file   Years of education: Not on file   Highest education level: Not on file  Occupational History   Occupation: retired > [employer]   Tobacco Use   Smoking status: Former    Packs/day: 1.50    Years: 30.00    Pack years: 45.00    Types: Cigarettes    Quit date: 10/08/1987    Years since quitting: 33.6   Smokeless tobacco: Current     Types: Chew  Vaping Use   Vaping Use: Never used  Substance and Sexual Activity   Alcohol use: Yes    Alcohol/week: 0.0 standard drinks    Comment: 1-2 shots of whiskey a few days a week   Drug use: Never   Sexual activity: Not on file  Other Topics Concern   Not on file  Social History Narrative   veteran   Social Determinants of Health   Financial Resource Strain: Not on file  Food Insecurity: Not on file  Transportation Needs: Not on file  Physical Activity: Not on file  Stress: Not on file  Social Connections: Not on file     Family History: The patient's family history includes Heart disease in his brother and mother; Lung cancer in his mother.  ROS:   Please see the history of present illness.    All other systems reviewed and are negative.  EKGs/Labs/Other Studies Reviewed:    The following studies were reviewed today:   EKG:  The ekg ordered today demonstrates atrial fibrillation with a controlled ventricular response with a ventricular rate of 74 bpm.  Recent Labs: 12/12/2020: B Natriuretic Peptide 321.5; Magnesium 1.9; TSH 0.977 12/13/2020: ALT 29 01/30/2021: BUN 19; Creatinine, Ser 1.31; Hemoglobin 15.0; Platelets 172; Potassium 4.1; Sodium 136  Recent Lipid Panel    Component Value Date/Time   CHOL 103 12/13/2020 0248   TRIG 31 12/13/2020 0248   HDL 46 12/13/2020 0248   CHOLHDL 2.2 12/13/2020 0248   VLDL 6 12/13/2020 0248   LDLCALC 51 12/13/2020 0248    Physical Exam:    VS:  BP 128/78   Pulse 74   Ht '5\' 10"'$  (1.778 m)   Wt 175 lb 6.4 oz (79.6 kg)   SpO2 96%   BMI 25.17 kg/m     Wt Readings from Last 3 Encounters:  05/22/21 175 lb 6.4 oz (79.6 kg)  02/16/21 187 lb (84.8 kg)  12/29/20 190 lb (86.2 kg)     GEN:  Well nourished, well developed in no acute distress HEENT: Normal NECK: No JVD; No carotid bruits LYMPHATICS: No lymphadenopathy CARDIAC: Irregularly irregular, no murmurs, rubs, gallops RESPIRATORY:  Clear to auscultation without  rales, wheezing or rhonchi  ABDOMEN: Soft, non-tender, non-distended MUSCULOSKELETAL:  No edema; No deformity  SKIN: Warm and dry NEUROLOGIC:  Alert and oriented x 3 PSYCHIATRIC:  Normal affect   ASSESSMENT:    1. Persistent atrial fibrillation ([place])   2. Primary  hypertension    PLAN:    In order of problems listed above:   1. Persistent atrial fibrillation [facility]) Patient is post ablation on Feb 16, 2021.  During the ablation the veins were isolated in addition to the posterior wall.  There was extensive scarring and ectopy on the posterior wall.  Unfortunately, he is back in atrial fibrillation today.  I discussed the available options including proceeding directly to cardioversion, initiation of antiarrhythmic drug plus cardioversion versus considering repeat ablation in several months.  We mutually decided to initiate dofetilide.  He will need to stop hydrochlorothiazide.  If we need more blood pressure control, consider initiation of amlodipine.  His QTC today is 430 ms.  His last creatinine was 1.3 with a GFR of 54.  I discussed the Tikosyn loading process in detail with the patient during today's visit and he wishes to proceed.  2. Primary hypertension Controlled.  He will need to stop hydrochlorothiazide prior to starting dofetilide.         Total time spent with patient today 30 minutes. This includes reviewing records, evaluating the patient and coordinating care.   Medication Adjustments/Labs and Tests Ordered: Current medicines are reviewed at length with the patient today.  Concerns regarding medicines are outlined above.  Orders Placed This Encounter  Procedures   EKG 12-Lead   No orders of the defined types were placed in this encounter.    Signed, Lars Mage, MD, [facility], [facility] 05/22/2021 1:51 PM    Electrophysiology [facility]

## 2021-05-23 ENCOUNTER — Telehealth: Payer: Self-pay | Admitting: Pharmacist

## 2021-05-23 NOTE — Telephone Encounter (Signed)
Medication list reviewed in anticipation of upcoming Tikosyn initiation. Patient is taking albuterol and mirtazapine which are QTc prolonging, as well as metformin which can increase concentrations of Tikosyn. These meds can be continued but will need to monitor QTc closely. He does take HCTZ which is contraindicated and will need to be stopped at least 3 days prior to Tikosyn load.  Patient is anticoagulated on Eliquis '5mg'$  BID on the appropriate dose. Please ensure that patient has not missed any anticoagulation doses in the 3 weeks prior to Tikosyn initiation.   Patient will need to be counseled to avoid use of Benadryl while on Tikosyn and in the 2-3 days prior to Tikosyn initiation.

## 2021-05-25 ENCOUNTER — Telehealth: Payer: Self-pay | Admitting: Pharmacist

## 2021-05-25 NOTE — Telephone Encounter (Signed)
Medication list reviewed in anticipation of upcoming Tikosyn initiation. Patient is taking 1 contraindicated medication and 2 QTc prolonging medications.   Patient will need to stop hydrochlorothiazide at least 3 days prior to starting Tikosyn.  Albuterol and mirtazapine are intermediate risk QTc prolongation drugs. Patient's QTc should be monitored closely.  Patient is anticoagulated on Eliquis on the appropriate dose. Please ensure that patient has not missed any anticoagulation doses in the 3 weeks prior to Tikosyn initiation.   Patient will need to be counseled to avoid use of Benadryl while on Tikosyn and in the 2-3 days prior to Tikosyn initiation.

## 2021-05-28 NOTE — Telephone Encounter (Signed)
Patient notified to stop HCTZ 3 days prior to admission.

## 2021-06-01 ENCOUNTER — Inpatient Hospital Stay: Admission: RE | Admit: 2021-06-01 | Payer: No Typology Code available for payment source | Source: Ambulatory Visit

## 2021-06-01 ENCOUNTER — Other Ambulatory Visit: Payer: Self-pay

## 2021-06-01 ENCOUNTER — Other Ambulatory Visit
Admission: RE | Admit: 2021-06-01 | Discharge: 2021-06-01 | Disposition: A | Discharge status: Home / Self Care | Payer: No Typology Code available for payment source | Source: Ambulatory Visit | Attending: Cardiology | Admitting: Cardiology

## 2021-06-01 DIAGNOSIS — Z01812 Encounter for preprocedural laboratory examination: Secondary | ICD-10-CM | POA: Diagnosis present

## 2021-06-01 DIAGNOSIS — U071 COVID-19: Secondary | ICD-10-CM | POA: Insufficient documentation

## 2021-06-01 LAB — SARS CORONAVIRUS 2 (TAT 6-24 HRS): SARS Coronavirus 2: POSITIVE — AB

## 2021-06-04 ENCOUNTER — Telehealth (HOSPITAL_COMMUNITY): Payer: Self-pay | Admitting: *Deleted

## 2021-06-04 ENCOUNTER — Ambulatory Visit (HOSPITAL_COMMUNITY): Payer: No Typology Code available for payment source | Admitting: Physician Assistant

## 2021-06-04 NOTE — Telephone Encounter (Signed)
Patient covid positive on pre-admission covid test for tikosyn admit. Pt is asymptomatic currently. Encouraged pt to contact PCP should symptoms worsen. Rescheduled for 9/19. Pt reminded to stop hctz 3 days prior to hospitalization pt is restarting until new admit date. Pt in agreement.

## 2021-06-25 ENCOUNTER — Encounter (HOSPITAL_COMMUNITY): Payer: Self-pay | Admitting: Cardiology

## 2021-06-25 ENCOUNTER — Other Ambulatory Visit: Payer: Self-pay

## 2021-06-25 ENCOUNTER — Inpatient Hospital Stay (HOSPITAL_COMMUNITY)
Admission: AD | Admit: 2021-06-25 | Discharge: 2021-06-28 | DRG: 309 | Disposition: A | Discharge status: Home / Self Care | Payer: No Typology Code available for payment source | Source: Ambulatory Visit | Attending: Cardiology | Admitting: Cardiology

## 2021-06-25 ENCOUNTER — Ambulatory Visit (HOSPITAL_COMMUNITY)
Admission: RE | Admit: 2021-06-25 | Discharge: 2021-06-25 | Disposition: A | Discharge status: Home / Self Care | Payer: No Typology Code available for payment source | Source: Ambulatory Visit | Attending: Physician Assistant | Admitting: Physician Assistant

## 2021-06-25 VITALS — BP 136/64 | HR 70 | Ht 70.0 in | Wt 183.6 lb

## 2021-06-25 DIAGNOSIS — K219 Gastro-esophageal reflux disease without esophagitis: Secondary | ICD-10-CM | POA: Diagnosis present

## 2021-06-25 DIAGNOSIS — E119 Type 2 diabetes mellitus without complications: Secondary | ICD-10-CM | POA: Diagnosis present

## 2021-06-25 DIAGNOSIS — Z85828 Personal history of other malignant neoplasm of skin: Secondary | ICD-10-CM

## 2021-06-25 DIAGNOSIS — F17220 Nicotine dependence, chewing tobacco, uncomplicated: Secondary | ICD-10-CM | POA: Diagnosis present

## 2021-06-25 DIAGNOSIS — Z23 Encounter for immunization: Secondary | ICD-10-CM | POA: Diagnosis not present

## 2021-06-25 DIAGNOSIS — D6869 Other thrombophilia: Secondary | ICD-10-CM | POA: Diagnosis present

## 2021-06-25 DIAGNOSIS — I4819 Other persistent atrial fibrillation: Secondary | ICD-10-CM

## 2021-06-25 DIAGNOSIS — Z79899 Other long term (current) drug therapy: Secondary | ICD-10-CM | POA: Diagnosis not present

## 2021-06-25 DIAGNOSIS — Z8249 Family history of ischemic heart disease and other diseases of the circulatory system: Secondary | ICD-10-CM | POA: Diagnosis not present

## 2021-06-25 DIAGNOSIS — Z801 Family history of malignant neoplasm of trachea, bronchus and lung: Secondary | ICD-10-CM

## 2021-06-25 DIAGNOSIS — I10 Essential (primary) hypertension: Secondary | ICD-10-CM | POA: Diagnosis present

## 2021-06-25 DIAGNOSIS — Z7984 Long term (current) use of oral hypoglycemic drugs: Secondary | ICD-10-CM | POA: Diagnosis not present

## 2021-06-25 DIAGNOSIS — E785 Hyperlipidemia, unspecified: Secondary | ICD-10-CM | POA: Diagnosis present

## 2021-06-25 DIAGNOSIS — Z7901 Long term (current) use of anticoagulants: Secondary | ICD-10-CM

## 2021-06-25 DIAGNOSIS — J45909 Unspecified asthma, uncomplicated: Secondary | ICD-10-CM | POA: Diagnosis present

## 2021-06-25 LAB — GLUCOSE, CAPILLARY
Glucose-Capillary: 159 mg/dL — ABNORMAL HIGH (ref 70–99)
Glucose-Capillary: 127 mg/dL — ABNORMAL HIGH (ref 70–99)

## 2021-06-25 LAB — BASIC METABOLIC PANEL
Anion gap: 7 (ref 5–15)
BUN: 14 mg/dL (ref 8–23)
CO2: 26 mmol/L (ref 22–32)
Calcium: 9 mg/dL (ref 8.9–10.3)
Chloride: 105 mmol/L (ref 98–111)
Creatinine, Ser: 1.19 mg/dL (ref 0.61–1.24)
GFR, Estimated: >60 mL/min (ref >60)
Glucose, Bld: 177 mg/dL — ABNORMAL HIGH (ref 70–99)
Potassium: 3.8 mmol/L (ref 3.5–5.1)
Sodium: 138 mmol/L (ref 135–145)

## 2021-06-25 LAB — MAGNESIUM: Magnesium: 2 mg/dL (ref 1.7–2.4)

## 2021-06-25 MED ORDER — SODIUM CHLORIDE 0.9% FLUSH
3.0000 mL | Freq: Two times a day (BID) | INTRAVENOUS | Status: DC
  Administered 2021-06-25 – 2021-06-28 (×6): 3 mL via INTRAVENOUS

## 2021-06-25 MED ORDER — PANTOPRAZOLE SODIUM 40 MG PO TBEC
40.0000 mg | DELAYED_RELEASE_TABLET | Freq: Every day | ORAL | Status: DC
  Administered 2021-06-26 – 2021-06-28 (×3): 40 mg via ORAL
  Filled 2021-06-25 (×3): qty 1

## 2021-06-25 MED ORDER — APIXABAN 5 MG PO TABS
5.0000 mg | ORAL_TABLET | Freq: Two times a day (BID) | ORAL | Status: DC
  Administered 2021-06-25 – 2021-06-28 (×6): 5 mg via ORAL
  Filled 2021-06-25: qty 2
  Filled 2021-06-25 (×5): qty 1

## 2021-06-25 MED ORDER — PROPYLENE GLYCOL 0.6 % OP SOLN: 1.0000 [drp] | Freq: Four times a day (QID) | OPHTHALMIC | Status: DC | PRN

## 2021-06-25 MED ORDER — SODIUM CHLORIDE 0.9% FLUSH: 3.0000 mL | INTRAVENOUS | Status: DC | PRN

## 2021-06-25 MED ORDER — ALBUTEROL SULFATE (2.5 MG/3ML) 0.083% IN NEBU: 3.0000 mL | INHALATION_SOLUTION | Freq: Four times a day (QID) | RESPIRATORY_TRACT | Status: DC | PRN

## 2021-06-25 MED ORDER — PRAVASTATIN SODIUM 40 MG PO TABS
40.0000 mg | ORAL_TABLET | Freq: Every day | ORAL | Status: DC
  Administered 2021-06-25 – 2021-06-27 (×3): 40 mg via ORAL
  Filled 2021-06-25 (×3): qty 1

## 2021-06-25 MED ORDER — LORATADINE 10 MG PO TABS
10.0000 mg | ORAL_TABLET | Freq: Every morning | ORAL | Status: DC
  Administered 2021-06-27 – 2021-06-28 (×2): 10 mg via ORAL
  Filled 2021-06-25 (×3): qty 1

## 2021-06-25 MED ORDER — TAMSULOSIN HCL 0.4 MG PO CAPS
0.4000 mg | ORAL_CAPSULE | Freq: Every day | ORAL | Status: DC
  Administered 2021-06-25 – 2021-06-27 (×3): 0.4 mg via ORAL
  Filled 2021-06-25 (×3): qty 1

## 2021-06-25 MED ORDER — INFLUENZA VAC A&B SA ADJ QUAD 0.5 ML IM PRSY
0.5000 mL | PREFILLED_SYRINGE | INTRAMUSCULAR | Status: AC
  Administered 2021-06-28: 0.5 mL via INTRAMUSCULAR
  Filled 2021-06-25: qty 0.5

## 2021-06-25 MED ORDER — LISINOPRIL 40 MG PO TABS
40.0000 mg | ORAL_TABLET | Freq: Every morning | ORAL | Status: DC
  Administered 2021-06-26 – 2021-06-28 (×3): 40 mg via ORAL
  Filled 2021-06-25 (×3): qty 1

## 2021-06-25 MED ORDER — SODIUM CHLORIDE 0.9 % IV SOLN: 250.0000 mL | INTRAVENOUS | Status: DC | PRN

## 2021-06-25 MED ORDER — MIRTAZAPINE 7.5 MG PO TABS
7.5000 mg | ORAL_TABLET | Freq: Every day | ORAL | Status: DC
  Administered 2021-06-25 – 2021-06-27 (×3): 7.5 mg via ORAL
  Filled 2021-06-25 (×4): qty 1

## 2021-06-25 MED ORDER — METFORMIN HCL ER 500 MG PO TB24
500.0000 mg | ORAL_TABLET | Freq: Every day | ORAL | Status: DC
  Administered 2021-06-26 – 2021-06-28 (×3): 500 mg via ORAL
  Filled 2021-06-25 (×3): qty 1

## 2021-06-25 MED ORDER — DOFETILIDE 250 MCG PO CAPS
250.0000 ug | ORAL_CAPSULE | Freq: Two times a day (BID) | ORAL | Status: DC
  Administered 2021-06-25 – 2021-06-28 (×6): 250 ug via ORAL
  Filled 2021-06-25 (×6): qty 1

## 2021-06-25 MED ORDER — POLYVINYL ALCOHOL 1.4 % OP SOLN
1.0000 [drp] | Freq: Every day | OPHTHALMIC | Status: DC
  Administered 2021-06-25: 1 [drp] via OPHTHALMIC
  Filled 2021-06-25: qty 15

## 2021-06-25 MED ORDER — DONEPEZIL HCL 5 MG PO TABS
5.0000 mg | ORAL_TABLET | Freq: Every day | ORAL | Status: DC
  Administered 2021-06-25 – 2021-06-27 (×3): 5 mg via ORAL
  Filled 2021-06-25 (×3): qty 1

## 2021-06-25 MED ORDER — POTASSIUM CHLORIDE CRYS ER 20 MEQ PO TBCR
40.0000 meq | EXTENDED_RELEASE_TABLET | Freq: Once | ORAL | Status: AC
  Administered 2021-06-25: 40 meq via ORAL
  Filled 2021-06-25: qty 2

## 2021-06-25 NOTE — Progress Notes (Signed)
Primary Care Physician: [facility] Primary Cardiologist: [facility] Primary Electrophysiologist: Dr [name] Referring Physician: Dr Towana Badger is a 85 y.o. male with a history of asthma, DM, HTN, HLD, atrial fibrillation who presents for follow up in the [facility]. Patient is on Eliquis for a CHADS2VASC score of 5. Patient is s/p afib ablation with Dr [name] on 01/28/52, complicated by groin hematoma. He was found to be back in afib on follow up 05/22/21 and dofetilide was recommended. Patient presents today for dofetilide admission. He denies any missed doses of anticoagulation. He stopped HCTZ 9/16.  Today, he denies symptoms of palpitations, chest pain, shortness of breath, orthopnea, PND, lower extremity edema, dizziness, presyncope, syncope, snoring, daytime somnolence, bleeding, or neurologic sequela. The patient is tolerating medications without difficulties and is otherwise without complaint today.    Atrial Fibrillation Risk Factors:  he does not have symptoms or diagnosis of sleep apnea. he does not have a history of rheumatic fever.   he has a BMI of Body mass index is 26.34 kg/m.Marland Kitchen Filed Weights   06/25/21 1136  Weight: 83.3 kg    Family History  Problem Relation Age of Onset   Lung cancer Mother        smoker   Heart disease Mother        bypass, further details unclear   Heart disease Brother        bypass, further details unclear     Atrial Fibrillation Management history:  Previous antiarrhythmic drugs: none Previous cardioversions: none Previous ablations: 02/16/21 CHADS2VASC score: 5 Anticoagulation history: Eliquis   Past Medical History:  Diagnosis Date   Allergic rhinitis    Asthma    DM type 2 (diabetes mellitus, type 2) ([place])    diet controlled   HTN (hypertension)    Hyperlipidemia    LV dysfunction    Persistent atrial fibrillation (HCC)    Squamous cell carcinoma of skin  10/02/2011   right arm - tx p bx   Squamous cell carcinoma of skin 01/03/2014   in situ on right cheek - tx p bx   Squamous cell carcinoma of skin 07/08/2017   in situ on left hand - tx p bx   Squamous cell carcinoma of skin 03/25/2018   well differentiated on top of left hand - tx p bx   Squamous cell carcinoma of skin 02/15/2020   well differentiated on left zygomatic area cx3, excision   Squamous cell carcinoma of skin 02/15/2020   in situ on right mid antihelix cx3 42fu   Past Surgical History:  Procedure Laterality Date   ATRIAL FIBRILLATION ABLATION N/A 02/16/2021   Procedure: [place];  Surgeon: Vickie Epley, MD;  Location: [facility];  Service: Cardiovascular;  Laterality: N/A;   HERNIA REPAIR     x 2   KNEE SURGERY     bilateral   LEFT HEART CATH AND CORONARY ANGIOGRAPHY N/A 12/14/2020   Procedure: LEFT HEART CATH AND CORONARY ANGIOGRAPHY;  Surgeon: Leonie Man, MD;  Location: [facility];  Service: Cardiovascular;  Laterality: N/A;   RECTAL SURGERY     shoulder bone spur / tendon repair      Current Outpatient Medications  Medication Sig Dispense Refill   albuterol (VENTOLIN HFA) 108 (90 Base) MCG/ACT inhaler Inhale 1-2 puffs into the lungs every 6 (six) hours as needed for wheezing or shortness of breath.  apixaban (ELIQUIS) 5 MG TABS tablet Take 5 mg by mouth 2 (two) times daily.     Carboxymethylcellulose Sod PF 1 % GEL Place 1 drop into both eyes at bedtime.     donepezil (ARICEPT) 5 MG tablet TAKE ONE TABLET BY MOUTH DAILY FOR MEMORY ISSUES     lisinopril (ZESTRIL) 40 MG tablet Take 40 mg by mouth in the morning.     loratadine (CLARITIN) 10 MG tablet Take 10 mg by mouth in the morning.     MENTHOL-METHYL SALICYLATE EX Apply 1 application topically 2 (two) times daily as needed (for joint pain.).     metFORMIN (GLUCOPHAGE-XR) 500 MG 24 hr tablet Take 500 mg by mouth in the morning.     mirtazapine (REMERON) 15 MG tablet  Take 0.5 tablets by mouth at bedtime.     pantoprazole (PROTONIX) 40 MG tablet Take 1 tablet (40 mg total) by mouth daily. 45 tablet 0   pravastatin (PRAVACHOL) 40 MG tablet Take 40 mg by mouth at bedtime.     Propylene Glycol (SYSTANE BALANCE) 0.6 % SOLN Place 1 drop into both eyes 4 (four) times daily as needed (dry eyes).     tamsulosin (FLOMAX) 0.4 MG CAPS capsule Take 0.4 mg by mouth at bedtime.     No current facility-administered medications for this encounter.    Allergies  Allergen Reactions   Amlodipine Other (See Comments)    Nightmares   Tape Rash    Eruption, Erythema    Social History   Socioeconomic History   Marital status: Widowed    Spouse name: Not on file   Number of children: Not on file   Years of education: Not on file   Highest education level: Not on file  Occupational History   Occupation: retired > [employer]   Tobacco Use   Smoking status: Former    Packs/day: 1.50    Years: 30.00    Pack years: 45.00    Types: Cigarettes    Quit date: 10/08/1987    Years since quitting: 33.7   Smokeless tobacco: Current    Types: Chew  Vaping Use   Vaping Use: Never used  Substance and Sexual Activity   Alcohol use: Yes    Alcohol/week: 0.0 standard drinks    Comment: 1-2 shots of whiskey a few days a week   Drug use: Never   Sexual activity: Not on file  Other Topics Concern   Not on file  Social History Narrative   veteran   Social Determinants of Health   Financial Resource Strain: Not on file  Food Insecurity: Not on file  Transportation Needs: Not on file  Physical Activity: Not on file  Stress: Not on file  Social Connections: Not on file  Intimate Partner Violence: Not on file     ROS- All systems are reviewed and negative except as per the HPI above.  Physical Exam: Vitals:   06/25/21 1136  BP: 136/64  Pulse: 70  Weight: 83.3 kg  Height: 5\' 10"  (1.778 m)    GEN- The patient is a well appearing elderly male, alert and oriented x  3 today.   Head- normocephalic, atraumatic Eyes-  Sclera clear, conjunctiva pink Ears- hearing intact Oropharynx- clear Neck- supple  Lungs- Clear to ausculation bilaterally, normal work of breathing Heart- irregular rate and rhythm, no murmurs, rubs or gallops  GI- soft, NT, ND, + BS Extremities- no clubbing, cyanosis, or edema MS- no significant deformity or atrophy Skin- no rash  or lesion Psych- euthymic mood, full affect Neuro- strength and sensation are intact  Wt Readings from Last 3 Encounters:  06/25/21 83.3 kg  05/22/21 79.6 kg  02/16/21 84.8 kg    EKG today demonstrates  Afib, PVC Vent. rate 70 BPM PR interval * ms QRS duration 116 ms QT/QTcB 402/434 ms  Echo 01/30/21 demonstrated  1. Left ventricular ejection fraction, by estimation, is 55 to 60%. The  left ventricle has normal function. The left ventricle has no regional  wall motion abnormalities. There is mild concentric left ventricular  hypertrophy. Left ventricular diastolic  function could not be evaluated.   2. Right ventricular systolic function is normal. The right ventricular  size is mildly enlarged.   3. Left atrial size was moderately dilated.   4. The mitral valve is normal in structure. Mild mitral valve  regurgitation. No evidence of mitral stenosis.   5. The aortic valve is normal in structure. Aortic valve regurgitation is  mild. No aortic stenosis is present.   6. There is mild dilatation of the aortic root, measuring 39 mm. There is mild dilatation of the ascending aorta, measuring 43 mm.   7. The inferior vena cava is normal in size with greater than 50%  respiratory variability, suggesting right atrial pressure of 3 mmHg.   Epic records are reviewed at length today  CHA2DS2-VASc Score = 5  The patient's score is based upon: CHF History: 0 HTN History: 1 Diabetes History: 1 Stroke History: 0 Vascular Disease History: 1 Age Score: 2 Gender Score: 0       ASSESSMENT AND  PLAN: 1. Persistent Atrial Fibrillation (ICD10:  I48.19) The patient's CHA2DS2-VASc score is 5, indicating a 7.2% annual risk of stroke.   S/p afib ablation 02/16/21 Patient presents for dofetilide admission.  Patient aware of price of dofetilide. Continue Eliquis 5 mg BID, states no missed doses in the last 3 weeks. No recent benadryl use PharmD has screened medications. HCTZ stopped 3 days prior. QTc in SR 455 ms, 434 ms today in afib. Labs today show creatinine at 1.19, K+ 3.8 and mag 2.0, CrCl calculated at 56 mL/min  2. Secondary Hypercoagulable State (ICD10:  D68.69) The patient is at significant risk for stroke/thromboembolism based upon his CHA2DS2-VASc Score of 5.  Continue Apixaban (Eliquis).   3. HTN Stable, no changes today.   To be admitted later today once a bed becomes available.    [facility] [address] [place], [place] [address] [phone] 06/25/2021 1:26 PM

## 2021-06-25 NOTE — Care Management (Signed)
1714 06-25-21 Patient presented for Tikosyn Load! Benefits check submitted for cost. Case Manager will follow for cost. Per patient, he goes to the [facility] and is 40% service connected. Patient states that his medications are free at the [facility] and that he and his cardiologist have discussed the medications. [facility] states [facility]. Case Manager will follow up with the [facility] in the am.

## 2021-06-25 NOTE — Progress Notes (Signed)
Pt heart rate as low as 34 while resting in bed reading a book, asymptomatic.  Dr. Dayton Martes notified. Will continue to monitor closely.

## 2021-06-25 NOTE — Progress Notes (Signed)
Potassium supplementation ordered No need to recheck potassium level prior to Tikosyn dose tonight  Tommye Standard, PA-C

## 2021-06-25 NOTE — H&P (Signed)
Primary Care Physician: [facility] Primary Cardiologist: [facility] Primary Electrophysiologist: Dr [name] Referring Physician: Dr Towana Badger is a 85 y.o. male with a history of asthma, DM, HTN, HLD, atrial fibrillation who presents for follow up in the [facility]. Patient is on Eliquis for a CHADS2VASC score of 5. Patient is s/p afib ablation with Dr [name] on [id number], complicated by groin hematoma. He was found to be back in afib on follow up 05/22/21 and dofetilide was recommended. Patient presents today for dofetilide admission. He denies any missed doses of anticoagulation. He stopped HCTZ 9/16.   Today, he denies symptoms of palpitations, chest pain, shortness of breath, orthopnea, PND, lower extremity edema, dizziness, presyncope, syncope, snoring, daytime somnolence, bleeding, or neurologic sequela. The patient is tolerating medications without difficulties and is otherwise without complaint today.      Atrial Fibrillation Risk Factors:   he does not have symptoms or diagnosis of sleep apnea. he does not have a history of rheumatic fever.     he has a BMI of Body mass index is 26.34 kg/m.Marland Kitchen    Filed Weights    06/25/21 1136  Weight: 83.3 kg           Family History  Problem Relation Age of Onset   Lung cancer Mother          smoker   Heart disease Mother          bypass, further details unclear   Heart disease Brother          bypass, further details unclear        Atrial Fibrillation Management history:   Previous antiarrhythmic drugs: none Previous cardioversions: none Previous ablations: 02/16/21 CHADS2VASC score: 5 Anticoagulation history: Eliquis         Past Medical History:  Diagnosis Date   Allergic rhinitis     Asthma     DM type 2 (diabetes mellitus, type 2) ([place])      diet controlled   HTN (hypertension)     Hyperlipidemia     LV dysfunction     Persistent atrial  fibrillation (HCC)     Squamous cell carcinoma of skin 10/02/2011    right arm - tx p bx   Squamous cell carcinoma of skin 01/03/2014    in situ on right cheek - tx p bx   Squamous cell carcinoma of skin 07/08/2017    in situ on left hand - tx p bx   Squamous cell carcinoma of skin 03/25/2018    well differentiated on top of left hand - tx p bx   Squamous cell carcinoma of skin 02/15/2020    well differentiated on left zygomatic area cx3, excision   Squamous cell carcinoma of skin 02/15/2020    in situ on right mid antihelix cx3 82f         Past Surgical History:  Procedure Laterality Date   ATRIAL FIBRILLATION ABLATION N/A 02/16/2021    Procedure: [place]  Surgeon: LVickie Epley MD;  [facility];  Service: Cardiovascular;  Laterality: N/A;   HERNIA REPAIR        x 2   KNEE SURGERY        bilateral   LEFT HEART CATH AND CORONARY ANGIOGRAPHY N/A 12/14/2020    Procedure: LEFT HEART CATH AND CORONARY ANGIOGRAPHY;  Surgeon: HLeonie Man MD;  [facility];  Service:  Cardiovascular;  Laterality: N/A;   RECTAL SURGERY       shoulder bone spur / tendon repair                Current Outpatient Medications  Medication Sig Dispense Refill   albuterol (VENTOLIN HFA) 108 (90 Base) MCG/ACT inhaler Inhale 1-2 puffs into the lungs every 6 (six) hours as needed for wheezing or shortness of breath.       apixaban (ELIQUIS) 5 MG TABS tablet Take 5 mg by mouth 2 (two) times daily.       Carboxymethylcellulose Sod PF 1 % GEL Place 1 drop into both eyes at bedtime.       donepezil (ARICEPT) 5 MG tablet TAKE ONE TABLET BY MOUTH DAILY FOR MEMORY ISSUES       lisinopril (ZESTRIL) 40 MG tablet Take 40 mg by mouth in the morning.       loratadine (CLARITIN) 10 MG tablet Take 10 mg by mouth in the morning.       MENTHOL-METHYL SALICYLATE EX Apply 1 application topically 2 (two) times daily as needed (for joint pain.).       metFORMIN  (GLUCOPHAGE-XR) 500 MG 24 hr tablet Take 500 mg by mouth in the morning.       mirtazapine (REMERON) 15 MG tablet Take 0.5 tablets by mouth at bedtime.       pantoprazole (PROTONIX) 40 MG tablet Take 1 tablet (40 mg total) by mouth daily. 45 tablet 0   pravastatin (PRAVACHOL) 40 MG tablet Take 40 mg by mouth at bedtime.       Propylene Glycol (SYSTANE BALANCE) 0.6 % SOLN Place 1 drop into both eyes 4 (four) times daily as needed (dry eyes).       tamsulosin (FLOMAX) 0.4 MG CAPS capsule Take 0.4 mg by mouth at bedtime.        No current facility-administered medications for this encounter.           Allergies  Allergen Reactions   Amlodipine Other (See Comments)      Nightmares   Tape Rash      Eruption, Erythema      Social History         Socioeconomic History   Marital status: Widowed      Spouse name: Not on file   Number of children: Not on file   Years of education: Not on file   Highest education level: Not on file  Occupational History   Occupation: retired > [employer]   Tobacco Use   Smoking status: Former      Packs/day: 1.50      Years: 30.00      Pack years: 45.00      Types: Cigarettes      Quit date: 10/08/1987      Years since quitting: 33.7   Smokeless tobacco: Current      Types: Chew  Vaping Use   Vaping Use: Never used  Substance and Sexual Activity   Alcohol use: Yes      Alcohol/week: 0.0 standard drinks      Comment: 1-2 shots of whiskey a few days a week   Drug use: Never   Sexual activity: Not on file  Other Topics Concern   Not on file  Social History Narrative    veteran    Social Determinants of Health    Financial Resource Strain: Not on file  Food Insecurity: Not on file  Transportation Needs: Not on file  Physical  Activity: Not on file  Stress: Not on file  Social Connections: Not on file  Intimate Partner Violence: Not on file        ROS- All systems are reviewed and negative except as per the HPI above.   Physical Exam:     Vitals:    06/25/21 1136  BP: 136/64  Pulse: 70  Weight: 83.3 kg  Height: '5\' 10"'$  (1.778 m)      GEN- The patient is a well appearing elderly male, alert and oriented x 3 today.   Head- normocephalic, atraumatic Eyes-  Sclera clear, conjunctiva pink Ears- hearing intact Oropharynx- clear Neck- supple  Lungs- Clear to ausculation bilaterally, normal work of breathing Heart- irregular rate and rhythm, no murmurs, rubs or gallops  GI- soft, NT, ND, + BS Extremities- no clubbing, cyanosis, or edema MS- no significant deformity or atrophy Skin- no rash or lesion Psych- euthymic mood, full affect Neuro- strength and sensation are intact      Wt Readings from Last 3 Encounters:  06/25/21 83.3 kg  05/22/21 79.6 kg  02/16/21 84.8 kg      EKG today demonstrates  Afib, PVC Vent. rate 70 BPM PR interval * ms QRS duration 116 ms QT/QTcB 402/434 ms   Echo 01/30/21 demonstrated  1. Left ventricular ejection fraction, by estimation, is 55 to 60%. The  left ventricle has normal function. The left ventricle has no regional  wall motion abnormalities. There is mild concentric left ventricular  hypertrophy. Left ventricular diastolic  function could not be evaluated.   2. Right ventricular systolic function is normal. The right ventricular  size is mildly enlarged.   3. Left atrial size was moderately dilated.   4. The mitral valve is normal in structure. Mild mitral valve  regurgitation. No evidence of mitral stenosis.   5. The aortic valve is normal in structure. Aortic valve regurgitation is  mild. No aortic stenosis is present.   6. There is mild dilatation of the aortic root, measuring 39 mm. There is mild dilatation of the ascending aorta, measuring 43 mm.   7. The inferior vena cava is normal in size with greater than 50%  respiratory variability, suggesting right atrial pressure of 3 mmHg.    Epic records are reviewed at length today   CHA2DS2-VASc Score = 5  The  patient's score is based upon: CHF History: 0 HTN History: 1 Diabetes History: 1 Stroke History: 0 Vascular Disease History: 1 Age Score: 2 Gender Score: 0         ASSESSMENT AND PLAN: 1. Persistent Atrial Fibrillation (ICD10:  I48.19) The patient's CHA2DS2-VASc score is 5, indicating a 7.2% annual risk of stroke.   S/p afib ablation 02/16/21 Patient presents for dofetilide admission.  Patient aware of price of dofetilide. Continue Eliquis 5 mg BID, states no missed doses in the last 3 weeks. No recent benadryl use PharmD has screened medications. HCTZ stopped 3 days prior. QTc in SR 455 ms, 434 ms today in afib. Labs today show creatinine at 1.19, K+ 3.8 and mag 2.0, CrCl calculated at 56 mL/min   2. Secondary Hypercoagulable State (ICD10:  D68.69) The patient is at significant risk for stroke/thromboembolism based upon his CHA2DS2-VASc Score of 5.  Continue Apixaban (Eliquis).    3. HTN Stable, no changes today.     To be admitted later today once a bed becomes available.      [facility] [place], [place]  [address] [phone] 06/25/2021 1:26 PM   ------------------------------------------------------------  I have seen, examined the patient, and reviewed the above assessment and plan.    Doing well. QTc 472m. Cr stable. Plan to start dofetilide 50107m PO BID this evening.   CAVickie EpleyMD 06/25/2021 4:34 PM

## 2021-06-26 DIAGNOSIS — I4819 Other persistent atrial fibrillation: Secondary | ICD-10-CM | POA: Diagnosis not present

## 2021-06-26 LAB — BASIC METABOLIC PANEL
Anion gap: 7 (ref 5–15)
BUN: 13 mg/dL (ref 8–23)
CO2: 24 mmol/L (ref 22–32)
Calcium: 8.7 mg/dL — ABNORMAL LOW (ref 8.9–10.3)
Chloride: 105 mmol/L (ref 98–111)
Creatinine, Ser: 1.02 mg/dL (ref 0.61–1.24)
GFR, Estimated: >60 mL/min (ref >60)
Glucose, Bld: 109 mg/dL — ABNORMAL HIGH (ref 70–99)
Potassium: 4 mmol/L (ref 3.5–5.1)
Sodium: 136 mmol/L (ref 135–145)

## 2021-06-26 LAB — GLUCOSE, CAPILLARY
Glucose-Capillary: 115 mg/dL — ABNORMAL HIGH (ref 70–99)
Glucose-Capillary: 112 mg/dL — ABNORMAL HIGH (ref 70–99)
Glucose-Capillary: 100 mg/dL — ABNORMAL HIGH (ref 70–99)
Glucose-Capillary: 135 mg/dL — ABNORMAL HIGH (ref 70–99)

## 2021-06-26 LAB — MAGNESIUM: Magnesium: 1.9 mg/dL (ref 1.7–2.4)

## 2021-06-26 MED ORDER — MAGNESIUM SULFATE 2 GM/50ML IV SOLN
2.0000 g | Freq: Once | INTRAVENOUS | Status: AC
  Administered 2021-06-26: 2 g via INTRAVENOUS
  Filled 2021-06-26: qty 50

## 2021-06-26 NOTE — Progress Notes (Signed)
Pharmacy: Dofetilide (Tikosyn) - Initial Consult Assessment and Electrolyte Replacement  Pharmacy consulted to assist in monitoring and replacing electrolytes in this 85 y.o. male admitted on 06/25/2021 undergoing dofetilide initiation.   Assessment:  Patient Exclusion Criteria: If any screening criteria checked as "Yes", then  patient  should NOT receive dofetilide until criteria item is corrected.  If "Yes" please indicate correction plan.  YES  NO Patient  Exclusion Criteria Correction Plan   []   [x]   Baseline QTc interval is greater than or equal to 440 msec. IF above YES box checked dofetilide contraindicated unless patient has ICD; then may proceed if QTc 500-550 msec or with known ventricular conduction abnormalities may proceed with QTc 550-600 msec. QTc =  434 per MD notes    []   [x]   Patient is known or suspected to have a digoxin level greater than 2 ng/ml: Lab Results  Component Value Date   DIGOXIN <0.2 (L) 12/12/2020       []   [x]   Creatinine clearance less than 20 ml/min (calculated using Cockcroft-Gault, actual body weight and serum creatinine): Estimated Creatinine Clearance: 59.5 mL/min (by C-G formula based on SCr of 1.02 mg/dL).     [x]   []  Patient has received drugs known to prolong the QT intervals within the last 48 hours (phenothiazines, tricyclics or tetracyclic antidepressants, erythromycin, H-1 antihistamines, cisapride, fluoroquinolones, azithromycin, ondansetron).   Updated information on QT prolonging agents is available to be searched on the following database:QT prolonging agents  On donepezil which can prolong the QTc. It is not a contraindicated medication. Plans are to continue with close monitoring   []   [x]   Patient received a dose of hydrochlorothiazide (Oretic) alone or in any combination including triamterene (Dyazide, Maxzide) in the last 48 hours. HCTZ was stopped  3 days prior to admission   []   [x]  Patient received a medication  known to increase dofetilide plasma concentrations prior to initial dofetilide dose:  Trimethoprim (Primsol, Proloprim) in the last 36 hours Verapamil (Calan, Verelan) in the last 36 hours or a sustained release dose in the last 72 hours Megestrol (Megace) in the last 5 days  Cimetidine (Tagamet) in the last 6 hours Ketoconazole (Nizoral) in the last 24 hours Itraconazole (Sporanox) in the last 48 hours  Prochlorperazine (Compazine) in the last 36 hours     []   [x]   Patient is known to have a history of torsades de pointes; congenital or acquired long QT syndromes.    []   [x]   Patient has received a Class 1 antiarrhythmic with less than 2 half-lives since last dose. (Disopyramide, Quinidine, Procainamide, Lidocaine, Mexiletine, Flecainide, Propafenone)    []   [x]   Patient has received amiodarone therapy in the past 3 months or amiodarone level is greater than 0.3 ng/ml.    Patient has been appropriately anticoagulated with apixaban.  Labs:    Component Value Date/Time   K 4.0 06/26/2021 0232   MG 1.9 06/26/2021 0232     Plan: Potassium: K >/= 4: Appropriate to initiate Tikosyn, no replacement needed    Magnesium: Mg 1.8-2: Give Mg 2 gm IV x1 to prevent Mg from dropping below 1.8 - do not need to recheck Mg. Appropriate to initiate Tikosyn   Thank you for allowing pharmacy to participate in this patient's care   Hildred Laser, PharmD Clinical Pharmacist **Pharmacist phone directory can now be found on [email or web address] ([email or web address]).  Listed under [place].

## 2021-06-26 NOTE — Progress Notes (Signed)
Progress Note  Patient Name: Edward Mcdaniel Date of Encounter: 06/26/2021  [facility] EP: Dr. [name]  Subjective   No complaints  Inpatient Medications    Scheduled Meds:  apixaban  5 mg Oral BID   dofetilide  250 mcg Oral BID   donepezil  5 mg Oral QHS   influenza vaccine adjuvanted  0.5 mL Intramuscular Tomorrow-1000   lisinopril  40 mg Oral q AM   loratadine  10 mg Oral q AM   metFORMIN  500 mg Oral Q breakfast   mirtazapine  7.5 mg Oral QHS   pantoprazole  40 mg Oral Daily   polyvinyl alcohol  1 drop Both Eyes QHS   pravastatin  40 mg Oral QHS   sodium chloride flush  3 mL Intravenous Q12H   tamsulosin  0.4 mg Oral QHS   Continuous Infusions:  sodium chloride     PRN Meds: sodium chloride, albuterol, sodium chloride flush   Vital Signs    Vitals:   06/25/21 1627 06/25/21 2022 06/26/21 0653 06/26/21 0827  BP: (!) 142/82 128/77 (!) 143/89 132/82  Pulse: (!) 52 (!) 59 67   Resp: 18 16 18    Temp: 98.4 F (36.9 C) (!) 97.3 F (36.3 C) 97.7 F (36.5 C)   TempSrc: Oral Oral Oral   SpO2: 98% 98% 99%   Weight: 82.8 kg     Height: 5\' 11"  (1.803 m)      No intake or output data in the 24 hours ending 06/26/21 0942 Last 3 Weights 06/25/2021 06/25/2021 05/22/2021  Weight (lbs) 182 lb 9.6 oz 183 lb 9.6 oz 175 lb 6.4 oz  Weight (kg) 82.827 kg 83.28 kg 79.561 kg      Telemetry    AFib, infreq-occ PVCs, nocturnal resting bradycardia, asymptomatic - Personally Reviewed  ECG    AFib 43bpm, QTc 453ms - Personally Reviewed  Physical Exam   GEN: No acute distress.   Neck: No JVD Cardiac: irreg-irreg, no murmurs, rubs, or gallops.  Respiratory: CTA b/l. GI: Soft, nontender, non-distended  MS: No edema; No deformity. Neuro:  Nonfocal  Psych: Normal affect   Labs    High Sensitivity Troponin:  No results for input(s): TROPONINIHS in the last 720 hours.   Chemistry Recent Labs  Lab 06/25/21 1200 06/26/21 0232  NA 138 136  K 3.8 4.0  CL 105 105   CO2 26 24  GLUCOSE 177* 109*  BUN 14 13  CREATININE 1.19 1.02  CALCIUM 9.0 8.7*  MG 2.0 1.9  GFRNONAA >60 >60  ANIONGAP 7 7    Lipids No results for input(s): CHOL, TRIG, HDL, LABVLDL, LDLCALC, CHOLHDL in the last 168 hours.  HematologyNo results for input(s): WBC, RBC, HGB, HCT, MCV, MCH, MCHC, RDW, PLT in the last 168 hours. Thyroid No results for input(s): TSH, FREET4 in the last 168 hours.  BNPNo results for input(s): BNP, PROBNP in the last 168 hours.  DDimer No results for input(s): DDIMER in the last 168 hours.   Radiology    No results found.  Cardiac Studies   02/16/21: EPS/Ablation CONCLUSIONS: 1. Successful PVI 2. Successful ablation/isolation of the posterior wall 3. Intracardiac echo reveals trivial pericardial effusion, normal LV function. 4. No early apparent complications. 5. Protonix 40mg  PO daily for 45 days   01/30/21: TTE IMPRESSIONS   1. Left ventricular ejection fraction, by estimation, is 55 to 60%. The  left ventricle has normal function. The left ventricle has no regional  wall motion abnormalities.  There is mild concentric left ventricular  hypertrophy. Left ventricular diastolic  function could not be evaluated.   2. Right ventricular systolic function is normal. The right ventricular  size is mildly enlarged.   3. Left atrial size was moderately dilated.   4. The mitral valve is normal in structure. Mild mitral valve  regurgitation. No evidence of mitral stenosis.   5. The aortic valve is normal in structure. Aortic valve regurgitation is  mild. No aortic stenosis is present.   6. There is mild dilatation of the aortic root, measuring 39 mm. There is  mild dilatation of the ascending aorta, measuring 43 mm.   7. The inferior vena cava is normal in size with greater than 50%  respiratory variability, suggesting right atrial pressure of 3 mmHg.   Patient Profile     85 y.o. male w/PMHx of HTN, DM, HLD, AFib admitted for Tikosyn  initiation  Assessment & Plan    Persistent AFib CHA2DS2Vasc is 5, on Eliquis Tikosyn load is I n progress  K+ 4.0 Mag 1.9 Creat 1.02 (stable) QTc stable  DCCV tomorrow if not in SR Pt aware and agreeable  2. HTN Home meds  3. DM Home meds  For questions or updates, please contact [place] Please consult [email or web address] for contact info under        Signed, Baldwin Jamaica, PA-C  06/26/2021, 9:42 AM

## 2021-06-26 NOTE — TOC Benefit Eligibility Note (Signed)
Transition of Care [facility]) Benefit Eligibility Note    Patient Details  Name: ELMOR KOST MRN: [id number] Date of Birth: Dec 28, 1935   Medication/Dose: DOFETILIDE  CAPSULE :   125 MCG BID  ,  250 MCG BID  , 500 MCG BID  Covered?: Yes  Tier:  (tier- 4 drug)  Prescription Coverage Preferred Pharmacy: [facility]  M/O  , [place] with Person/Company/Phone Number:: ALLEN  @  [employer]  # [phone]  Co-Pay: $95.00  Prior Approval: No  Deductible: Unmet (OUT-OF-POCKET:UNMET)  Additional Notes: TIKOSYN CAPSULE 125 MCG BID  ,250 MCG BID , 500 MCG BID  :NOT COVER  /  Crecencio Mc Phone Number: 06/26/2021, 10:22 AM

## 2021-06-26 NOTE — H&P (View-Only) (Signed)
Progress Note  Patient Name: Edward Mcdaniel Date of Encounter: 06/26/2021  [facility] EP: Dr. [name]  Subjective   No complaints  Inpatient Medications    Scheduled Meds:  apixaban  5 mg Oral BID   dofetilide  250 mcg Oral BID   donepezil  5 mg Oral QHS   influenza vaccine adjuvanted  0.5 mL Intramuscular Tomorrow-1000   lisinopril  40 mg Oral q AM   loratadine  10 mg Oral q AM   metFORMIN  500 mg Oral Q breakfast   mirtazapine  7.5 mg Oral QHS   pantoprazole  40 mg Oral Daily   polyvinyl alcohol  1 drop Both Eyes QHS   pravastatin  40 mg Oral QHS   sodium chloride flush  3 mL Intravenous Q12H   tamsulosin  0.4 mg Oral QHS   Continuous Infusions:  sodium chloride     PRN Meds: sodium chloride, albuterol, sodium chloride flush   Vital Signs    Vitals:   06/25/21 1627 06/25/21 2022 06/26/21 0653 06/26/21 0827  BP: (!) 142/82 128/77 (!) 143/89 132/82  Pulse: (!) 52 (!) 59 67   Resp: 18 16 18    Temp: 98.4 F (36.9 C) (!) 97.3 F (36.3 C) 97.7 F (36.5 C)   TempSrc: Oral Oral Oral   SpO2: 98% 98% 99%   Weight: 82.8 kg     Height: 5\' 11"  (1.803 m)      No intake or output data in the 24 hours ending 06/26/21 0942 Last 3 Weights 06/25/2021 06/25/2021 05/22/2021  Weight (lbs) 182 lb 9.6 oz 183 lb 9.6 oz 175 lb 6.4 oz  Weight (kg) 82.827 kg 83.28 kg 79.561 kg      Telemetry    AFib, infreq-occ PVCs, nocturnal resting bradycardia, asymptomatic - Personally Reviewed  ECG    AFib 43bpm, QTc 479ms - Personally Reviewed  Physical Exam   GEN: No acute distress.   Neck: No JVD Cardiac: irreg-irreg, no murmurs, rubs, or gallops.  Respiratory: CTA b/l. GI: Soft, nontender, non-distended  MS: No edema; No deformity. Neuro:  Nonfocal  Psych: Normal affect   Labs    High Sensitivity Troponin:  No results for input(s): TROPONINIHS in the last 720 hours.   Chemistry Recent Labs  Lab 06/25/21 1200 06/26/21 0232  NA 138 136  K 3.8 4.0  CL 105 105   CO2 26 24  GLUCOSE 177* 109*  BUN 14 13  CREATININE 1.19 1.02  CALCIUM 9.0 8.7*  MG 2.0 1.9  GFRNONAA >60 >60  ANIONGAP 7 7    Lipids No results for input(s): CHOL, TRIG, HDL, LABVLDL, LDLCALC, CHOLHDL in the last 168 hours.  HematologyNo results for input(s): WBC, RBC, HGB, HCT, MCV, MCH, MCHC, RDW, PLT in the last 168 hours. Thyroid No results for input(s): TSH, FREET4 in the last 168 hours.  BNPNo results for input(s): BNP, PROBNP in the last 168 hours.  DDimer No results for input(s): DDIMER in the last 168 hours.   Radiology    No results found.  Cardiac Studies   02/16/21: EPS/Ablation CONCLUSIONS: 1. Successful PVI 2. Successful ablation/isolation of the posterior wall 3. Intracardiac echo reveals trivial pericardial effusion, normal LV function. 4. No early apparent complications. 5. Protonix 40mg  PO daily for 45 days   01/30/21: TTE IMPRESSIONS   1. Left ventricular ejection fraction, by estimation, is 55 to 60%. The  left ventricle has normal function. The left ventricle has no regional  wall motion abnormalities.  There is mild concentric left ventricular  hypertrophy. Left ventricular diastolic  function could not be evaluated.   2. Right ventricular systolic function is normal. The right ventricular  size is mildly enlarged.   3. Left atrial size was moderately dilated.   4. The mitral valve is normal in structure. Mild mitral valve  regurgitation. No evidence of mitral stenosis.   5. The aortic valve is normal in structure. Aortic valve regurgitation is  mild. No aortic stenosis is present.   6. There is mild dilatation of the aortic root, measuring 39 mm. There is  mild dilatation of the ascending aorta, measuring 43 mm.   7. The inferior vena cava is normal in size with greater than 50%  respiratory variability, suggesting right atrial pressure of 3 mmHg.   Patient Profile     85 y.o. male w/PMHx of HTN, DM, HLD, AFib admitted for Tikosyn  initiation  Assessment & Plan    Persistent AFib CHA2DS2Vasc is 5, on Eliquis Tikosyn load is I n progress  K+ 4.0 Mag 1.9 Creat 1.02 (stable) QTc stable  DCCV tomorrow if not in SR Pt aware and agreeable  2. HTN Home meds  3. DM Home meds  For questions or updates, please contact [place] Please consult [email or web address] for contact info under        Signed, Baldwin Jamaica, PA-C  06/26/2021, 9:42 AM

## 2021-06-27 ENCOUNTER — Encounter (HOSPITAL_COMMUNITY)
Admission: AD | Disposition: A | Discharge status: Home / Self Care | Payer: No Typology Code available for payment source | Source: Ambulatory Visit | Attending: Cardiology

## 2021-06-27 ENCOUNTER — Inpatient Hospital Stay (HOSPITAL_COMMUNITY): Payer: No Typology Code available for payment source | Admitting: Certified Registered Nurse Anesthetist

## 2021-06-27 ENCOUNTER — Encounter (HOSPITAL_COMMUNITY): Payer: Self-pay | Admitting: Cardiology

## 2021-06-27 DIAGNOSIS — I4819 Other persistent atrial fibrillation: Secondary | ICD-10-CM | POA: Diagnosis not present

## 2021-06-27 HISTORY — PX: CARDIOVERSION: SHX1299

## 2021-06-27 LAB — BASIC METABOLIC PANEL
Anion gap: 6 (ref 5–15)
BUN: 16 mg/dL (ref 8–23)
CO2: 26 mmol/L (ref 22–32)
Calcium: 8.7 mg/dL — ABNORMAL LOW (ref 8.9–10.3)
Chloride: 106 mmol/L (ref 98–111)
Creatinine, Ser: 1.25 mg/dL — ABNORMAL HIGH (ref 0.61–1.24)
GFR, Estimated: 57 mL/min — ABNORMAL LOW (ref >60)
Glucose, Bld: 109 mg/dL — ABNORMAL HIGH (ref 70–99)
Potassium: 4 mmol/L (ref 3.5–5.1)
Sodium: 138 mmol/L (ref 135–145)

## 2021-06-27 LAB — GLUCOSE, CAPILLARY
Glucose-Capillary: 118 mg/dL — ABNORMAL HIGH (ref 70–99)
Glucose-Capillary: 92 mg/dL (ref 70–99)
Glucose-Capillary: 131 mg/dL — ABNORMAL HIGH (ref 70–99)
Glucose-Capillary: 120 mg/dL — ABNORMAL HIGH (ref 70–99)

## 2021-06-27 LAB — MAGNESIUM: Magnesium: 2.1 mg/dL (ref 1.7–2.4)

## 2021-06-27 SURGERY — CARDIOVERSION
Anesthesia: General

## 2021-06-27 MED ORDER — HYDROCORTISONE 1 % EX CREA
TOPICAL_CREAM | Freq: Once | CUTANEOUS | Status: AC | PRN
  Filled 2021-06-27 (×2): qty 28

## 2021-06-27 MED ORDER — SODIUM CHLORIDE 0.9 % IV SOLN
INTRAVENOUS | Status: DC
  Administered 2021-06-27: 500 mL via INTRAVENOUS

## 2021-06-27 MED ORDER — LIDOCAINE 2% (20 MG/ML) 5 ML SYRINGE
INTRAMUSCULAR | Status: DC | PRN
  Administered 2021-06-27: 60 mg via INTRAVENOUS

## 2021-06-27 MED ORDER — PROPOFOL 10 MG/ML IV BOLUS
INTRAVENOUS | Status: DC | PRN
  Administered 2021-06-27: 100 mg via INTRAVENOUS

## 2021-06-27 NOTE — Transfer of Care (Signed)
Immediate Anesthesia Transfer of Care Note  Patient: Edward Mcdaniel  Procedure(s) Performed: CARDIOVERSION  Patient Location: PACU and Endoscopy Unit  Anesthesia Type:General  Level of Consciousness: drowsy  Airway & Oxygen Therapy: Patient Spontanous Breathing and Patient connected to nasal cannula oxygen  Post-op Assessment: Report given to RN and Post -op Vital signs reviewed and stable  Post vital signs: Reviewed and stable  Last Vitals:  Vitals Value Taken Time  BP 128/78    Temp    Pulse 64   Resp 16   SpO2 97     Last Pain:  Vitals:   06/27/21 1010  TempSrc: Oral  PainSc: 0-No pain      Patients Stated Pain Goal: 0 (18/40/37 5436)  Complications: No notable events documented.

## 2021-06-27 NOTE — Progress Notes (Signed)
Progress Note  Patient Name: Edward Mcdaniel Date of Encounter: 06/27/2021  [facility] EP: Dr. [name]  Subjective   No complaints  Inpatient Medications    Scheduled Meds:  apixaban  5 mg Oral BID   dofetilide  250 mcg Oral BID   donepezil  5 mg Oral QHS   influenza vaccine adjuvanted  0.5 mL Intramuscular Tomorrow-1000   lisinopril  40 mg Oral q AM   loratadine  10 mg Oral q AM   metFORMIN  500 mg Oral Q breakfast   mirtazapine  7.5 mg Oral QHS   pantoprazole  40 mg Oral Daily   polyvinyl alcohol  1 drop Both Eyes QHS   pravastatin  40 mg Oral QHS   sodium chloride flush  3 mL Intravenous Q12H   tamsulosin  0.4 mg Oral QHS   Continuous Infusions:  sodium chloride     PRN Meds: sodium chloride, albuterol, sodium chloride flush   Vital Signs    Vitals:   06/26/21 1348 06/26/21 2047 06/27/21 0600 06/27/21 0604  BP: (!) 155/83 128/85 134/89 134/89  Pulse: (!) 53 (!) 54 (!) 49   Resp: 19 19 18    Temp: 97.7 F (36.5 C) 97.7 F (36.5 C) 97.8 F (36.6 C)   TempSrc: Oral Oral Oral   SpO2: 97% 97% 97%   Weight:      Height:       No intake or output data in the 24 hours ending 06/27/21 0811 Last 3 Weights 06/25/2021 06/25/2021 05/22/2021  Weight (lbs) 182 lb 9.6 oz 183 lb 9.6 oz 175 lb 6.4 oz  Weight (kg) 82.827 kg 83.28 kg 79.561 kg      Telemetry    AFib, 40's-50's, rare PVCs, nocturnal resting bradycardia, asymptomatic, HR 60's-70s with ambulation- Personally Reviewed  ECG    AFib 46bpm, QTc 458ms - Personally Reviewed  Physical Exam   unchanged GEN: No acute distress.   Neck: No JVD Cardiac: irreg-irreg, no murmurs, rubs, or gallops.  Respiratory: CTA b/l. GI: Soft, nontender, non-distended  MS: No edema; No deformity. Neuro:  Nonfocal  Psych: Normal affect   Labs    High Sensitivity Troponin:  No results for input(s): TROPONINIHS in the last 720 hours.   Chemistry Recent Labs  Lab 06/25/21 1200 06/26/21 0232 06/27/21 0249  NA 138  136 138  K 3.8 4.0 4.0  CL 105 105 106  CO2 26 24 26   GLUCOSE 177* 109* 109*  BUN 14 13 16   CREATININE 1.19 1.02 1.25*  CALCIUM 9.0 8.7* 8.7*  MG 2.0 1.9 2.1  GFRNONAA >60 >60 57*  ANIONGAP 7 7 6     Lipids No results for input(s): CHOL, TRIG, HDL, LABVLDL, LDLCALC, CHOLHDL in the last 168 hours.  HematologyNo results for input(s): WBC, RBC, HGB, HCT, MCV, MCH, MCHC, RDW, PLT in the last 168 hours. Thyroid No results for input(s): TSH, FREET4 in the last 168 hours.  BNPNo results for input(s): BNP, PROBNP in the last 168 hours.  DDimer No results for input(s): DDIMER in the last 168 hours.   Radiology    No results found.  Cardiac Studies   02/16/21: EPS/Ablation CONCLUSIONS: 1. Successful PVI 2. Successful ablation/isolation of the posterior wall 3. Intracardiac echo reveals trivial pericardial effusion, normal LV function. 4. No early apparent complications. 5. Protonix 40mg  PO daily for 45 days   01/30/21: TTE IMPRESSIONS   1. Left ventricular ejection fraction, by estimation, is 55 to 60%. The  left ventricle  has normal function. The left ventricle has no regional  wall motion abnormalities. There is mild concentric left ventricular  hypertrophy. Left ventricular diastolic  function could not be evaluated.   2. Right ventricular systolic function is normal. The right ventricular  size is mildly enlarged.   3. Left atrial size was moderately dilated.   4. The mitral valve is normal in structure. Mild mitral valve  regurgitation. No evidence of mitral stenosis.   5. The aortic valve is normal in structure. Aortic valve regurgitation is  mild. No aortic stenosis is present.   6. There is mild dilatation of the aortic root, measuring 39 mm. There is  mild dilatation of the ascending aorta, measuring 43 mm.   7. The inferior vena cava is normal in size with greater than 50%  respiratory variability, suggesting right atrial pressure of 3 mmHg.   Patient Profile     85  y.o. male w/PMHx of HTN, DM, HLD, AFib admitted for Tikosyn initiation  Assessment & Plan    Persistent AFib CHA2DS2Vasc is 5, on Eliquis Tikosyn load is I n progress  K+ 4.0 Mag 2.1 Creat 1.25 (calc cr cl is 53) QTc stable  DCCV today   2. HTN Home meds  3. DM Home meds   Anticipate home tomorrow  For questions or updates, please contact [place] Please consult [email or web address] for contact info under        Signed, Baldwin Jamaica, PA-C  06/27/2021, 8:11 AM

## 2021-06-27 NOTE — Anesthesia Procedure Notes (Signed)
Procedure Name: General with mask airway Date/Time: 06/27/2021 10:46 AM Performed by: Lowella Dell, CRNA Pre-anesthesia Checklist: Patient identified, Emergency Drugs available, Suction available, Patient being monitored and Timeout performed Patient Re-evaluated:Patient Re-evaluated prior to induction Oxygen Delivery Method: Ambu bag Preoxygenation: Pre-oxygenation with 100% oxygen Induction Type: IV induction Ventilation: Mask ventilation without difficulty Placement Confirmation: positive ETCO2 Dental Injury: Teeth and Oropharynx as per pre-operative assessment

## 2021-06-27 NOTE — Progress Notes (Signed)
   Called to review telemetry for patient receiving Tikosyn load due to concern for 2nd degree AV block type 2. Reviewed telemetry strips with Dr. Domenic Polite as well as Dr. 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 Patient asymptomatic. OK to continue with Tikosyn load. Discussed with RN.  Darreld Mclean, PA-C 06/27/2021 6:59 PM

## 2021-06-27 NOTE — Interval H&P Note (Signed)
History and Physical Interval Note:  06/27/2021 10:38 AM  Edward Mcdaniel  has presented today for surgery, with the diagnosis of afib.  The various methods of treatment have been discussed with the patient and family. After consideration of risks, benefits and other options for treatment, the patient has consented to  Procedure(s): CARDIOVERSION (N/A) as a surgical intervention.  The patient's history has been reviewed, patient examined, no change in status, stable for surgery.  I have reviewed the patient's chart and labs.  Questions were answered to the patient's satisfaction.     [name], MD

## 2021-06-27 NOTE — Anesthesia Preprocedure Evaluation (Signed)
Anesthesia Evaluation  Patient identified by MRN, date of birth, ID band Patient awake    Reviewed: Allergy & Precautions, NPO status , Patient's Chart, lab work & pertinent test results  Airway Mallampati: II  TM Distance: >3 FB Neck ROM: Full    Dental  (+) Partial Upper   Pulmonary asthma , sleep apnea , former smoker,    Pulmonary exam normal        Cardiovascular hypertension, Pt. on medications + angina + dysrhythmias (on Eliquis) Atrial Fibrillation  Rhythm:Irregular Rate:Normal     Neuro/Psych    GI/Hepatic GERD  Medicated,  Endo/Other  diabetes, Type 2, Oral Hypoglycemic Agents  Renal/GU      Musculoskeletal   Abdominal (+)  Abdomen: soft.    Peds  Hematology   Anesthesia Other Findings   Reproductive/Obstetrics                             Anesthesia Physical Anesthesia Plan  ASA: 3  Anesthesia Plan: General   Post-op Pain Management:    Induction: Intravenous  PONV Risk Score and Plan: 2 and Propofol infusion and Treatment may vary due to age or medical condition  Airway Management Planned: Mask  Additional Equipment: None  Intra-op Plan:   Post-operative Plan:   Informed Consent: I have reviewed the patients History and Physical, chart, labs and discussed the procedure including the risks, benefits and alternatives for the proposed anesthesia with the patient or authorized representative who has indicated his/her understanding and acceptance.     Dental advisory given  Plan Discussed with: CRNA  Anesthesia Plan Comments: ([place])        Anesthesia Quick Evaluation

## 2021-06-27 NOTE — Progress Notes (Signed)
Pharmacy: Dofetilide (Tikosyn) - Follow Up Assessment and Electrolyte Replacement  Pharmacy consulted to assist in monitoring and replacing electrolytes in this 85 y.o. male admitted on 06/25/2021 undergoing dofetilide initiation. First dofetilide dose: 06/25/21.  Labs:    Component Value Date/Time   K 4.0 06/27/2021 0249   MG 2.1 06/27/2021 0249     Plan: Potassium: K >/= 4: No additional supplementation needed  Magnesium: Mg > 2: No additional supplementation needed  Copay $90  Thank you for allowing pharmacy to participate in this patient's care   Erin Hearing PharmD., BCPS Clinical Pharmacist 06/27/2021 7:47 AM

## 2021-06-27 NOTE — Progress Notes (Addendum)
RN notified that patient was having Mobitz 2 by CMT.  [place], [place] who stated she would come and see patient/telemetry.   Patient says he feels great.     1959:  follow up conversation with Sande Rives, PA:  ok to continue tikosyn tonight.   Strips are Mobitz I with blocked PACs.

## 2021-06-27 NOTE — CV Procedure (Signed)
Electrical Cardioversion Procedure Note Edward Mcdaniel [id number] Oct 07, 1936  Procedure: Electrical Cardioversion Indications:  Atrial Fibrillation  Procedure Details Consent: Risks of procedure as well as the alternatives and risks of each were explained to the (patient/caregiver).  Consent for procedure obtained. Time Out: Verified patient identification, verified procedure, site/side was marked, verified correct patient position, special equipment/implants available, medications/allergies/relevent history reviewed, required imaging and test results available.  Performed  Patient placed on cardiac monitor, pulse oximetry, supplemental oxygen as necessary.  Sedation given:  propofol Pacer pads placed anterior and posterior chest.  Cardioverted 1 time(s).  Cardioverted at [name].  Evaluation Findings: Post procedure EKG shows:  sinus rhythm with PACs and PVCs Complications: None Patient did tolerate procedure well.   Edward Latch, MD 06/27/2021, 10:53 AM

## 2021-06-27 NOTE — Care Management (Addendum)
1436 9-21 Case Manager has called [place] [facility] to obtain fax number to Cardiology and to the [facility] for Dofetilide. Awaiting call back from the .   1516 06-27-21 Case Manager spoke with Edward Mcdaniel with the [facility]- hard copy of Rx can be sent to the [place] vs. E-script to the [facility]. The Pharmacy closes at 4:30 Thursday evening.

## 2021-06-27 NOTE — Discharge Summary (Signed)
ELECTROPHYSIOLOGY PROCEDURE DISCHARGE SUMMARY    Patient ID: Edward Mcdaniel,  MRN: [id number], DOB/AGE: 05-18-1936 85 y.o.  Admit date: 06/25/2021 Discharge date: 06/28/2021  Primary Care Physician: [facility]  Electrophysiologist: Dr. [name]  Primary Discharge Diagnosis:  1.  persistent atrial fibrillation status post Tikosyn loading this admission      CHA2DS2Vasc is 5, on Eliquis  Secondary Discharge Diagnosis:  HTN DM HLD  Allergies  Allergen Reactions   Amlodipine Other (See Comments)    Nightmares   Tape Rash    Eruption, Erythema     Procedures This Admission:  1.  Tikosyn loading 2.  Direct current cardioversion on 06/27/21 by Dr Oval Linsey which successfully restored SR.  There were no early apparent complications.   Brief HPI: Edward Mcdaniel is a 85 y.o. male with a past medical history as noted above.  They were referred to EP in the outpatient setting for treatment options of atrial fibrillation.  Risks, benefits, and alternatives to Tikosyn were reviewed with the patient who wished to proceed.    Hospital Course:  The patient was admitted and Tikosyn was initiated.  Renal function and electrolytes were followed during the hospitalization.  The patient's QTc remained stable.  On 06/27/21 the patient underwent direct current cardioversion which restored sinus rhythm.  He was monitored until discharge on telemetry which demonstrated AFib with SVR and post DCCV SR/SB, 1st degree AVBlock 50's-60's with occ PACs, blocked PACs, and Mobitz one without significant bradycardia, and all without symptoms.  On the day of discharge, the patient feels well, was examined by Dr [name] who considered the patient stable for discharge to home.  Follow-up has been arranged with the AFib clinic in 1 week and with Dr [name] in 4 weeks.   Tikosyn teaching was completed No new or additional electrolyte replacement for home  The patient will fill 1st 30 days with TOC  >> to transfer his refills to [facility]  Physical Exam: Vitals:   06/27/21 1417 06/27/21 2003 06/28/21 0548 06/28/21 0800  BP: 125/83 123/80 133/82 (!) 146/77  Pulse:  60 (!) 56 65  Resp: 16 14 15 16   Temp: 97.9 F (36.6 C) 98.2 F (36.8 C) 98 F (36.7 C) 97.7 F (36.5 C)  TempSrc: Oral Oral Oral Oral  SpO2:  96% 95% 95%  Weight:      Height:         GEN- The patient is well appearing, alert and oriented x 3 today.   HEENT: normocephalic, atraumatic; sclera clear, conjunctiva pink; hearing intact; oropharynx clear; neck supple, no JVP Lymph- no cervical lymphadenopathy Lungs- CTA b/l, normal work of breathing.  No wheezes, rales, rhonchi Heart- RRR, no murmurs, rubs or gallops, PMI not laterally displaced GI- soft, non-tender, non-distended Extremities- no clubbing, cyanosis, or edema MS- no significant deformity or atrophy Skin- warm and dry, no rash or lesion Psych- euthymic mood, full affect Neuro- strength and sensation are intact   Labs:   Lab Results  Component Value Date   WBC 5.7 01/30/2021   HGB 15.0 01/30/2021   HCT 44.2 01/30/2021   MCV 97 01/30/2021   PLT 172 01/30/2021    Recent Labs  Lab 06/28/21 0222  NA 137  K 3.9  CL 106  CO2 25  BUN 15  CREATININE 1.10  CALCIUM 8.9  GLUCOSE 109*     Discharge Medications:  Allergies as of 06/28/2021       Reactions   Amlodipine Other (  See Comments)   Nightmares   Tape Rash   Eruption, Erythema        Medication List     TAKE these medications    acetaminophen 500 MG tablet Commonly known as: TYLENOL Take 500 mg by mouth every 6 (six) hours as needed for headache or mild pain.   albuterol 108 (90 Base) MCG/ACT inhaler Commonly known as: VENTOLIN HFA Inhale 1-2 puffs into the lungs every 6 (six) hours as needed for wheezing or shortness of breath.   apixaban 5 MG Tabs tablet Commonly known as: ELIQUIS Take 5 mg by mouth 2 (two) times daily.   Carboxymethylcellulose Sod PF 1 %  Gel Place 1 drop into both eyes at bedtime.   dofetilide 250 MCG capsule Commonly known as: TIKOSYN Take 1 capsule (250 mcg total) by mouth 2 (two) times daily.   donepezil 5 MG tablet Commonly known as: ARICEPT Take 5 mg by mouth at bedtime.   lisinopril 40 MG tablet Commonly known as: ZESTRIL Take 40 mg by mouth in the morning.   loratadine 10 MG tablet Commonly known as: CLARITIN Take 10 mg by mouth in the morning.   MENTHOL-METHYL SALICYLATE EX Apply 1 application topically 2 (two) times daily as needed (for joint pain.).   metFORMIN 500 MG 24 hr tablet Commonly known as: GLUCOPHAGE-XR Take 500 mg by mouth in the morning.   mirtazapine 15 MG tablet Commonly known as: REMERON Take 0.5 tablets by mouth at bedtime.   pantoprazole 40 MG tablet Commonly known as: PROTONIX Take 1 tablet (40 mg total) by mouth daily.   pravastatin 40 MG tablet Commonly known as: PRAVACHOL Take 40 mg by mouth at bedtime.   Systane Balance 0.6 % Soln Generic drug: Propylene Glycol Place 1 drop into both eyes 4 (four) times daily as needed (dry eyes).   tamsulosin 0.4 MG Caps capsule Commonly known as: FLOMAX Take 0.4 mg by mouth at bedtime.        Disposition: Home Discharge Instructions     Diet - low sodium heart healthy   Complete by: As directed    Increase activity slowly   Complete by: As directed        Follow-up Information     [facility] Follow up.   Specialty: Cardiology Why: 07/04/21 @ 11:30AM with Marcene Brawn, PA-C Contact information: [address] [phone] [place]        Vickie Epley, MD Follow up.   Specialties: Cardiology, Radiology Why: 08/01/21 @ 2:30PM Contact information: [place] [id number] [phone]                 Duration of Discharge Encounter: Greater than 30 minutes including physician time.  Venetia Night, PA-C 06/28/2021 11:28 AM

## 2021-06-27 NOTE — Progress Notes (Signed)
Patient washed face and hands. Patient stated "I am going home today and will wash at home."

## 2021-06-28 ENCOUNTER — Other Ambulatory Visit (HOSPITAL_COMMUNITY): Payer: Self-pay

## 2021-06-28 ENCOUNTER — Telehealth (HOSPITAL_COMMUNITY): Payer: Self-pay | Admitting: Physician Assistant

## 2021-06-28 DIAGNOSIS — I4819 Other persistent atrial fibrillation: Secondary | ICD-10-CM | POA: Diagnosis not present

## 2021-06-28 LAB — BASIC METABOLIC PANEL
Anion gap: 6 (ref 5–15)
BUN: 15 mg/dL (ref 8–23)
CO2: 25 mmol/L (ref 22–32)
Calcium: 8.9 mg/dL (ref 8.9–10.3)
Chloride: 106 mmol/L (ref 98–111)
Creatinine, Ser: 1.1 mg/dL (ref 0.61–1.24)
GFR, Estimated: >60 mL/min (ref >60)
Glucose, Bld: 109 mg/dL — ABNORMAL HIGH (ref 70–99)
Potassium: 3.9 mmol/L (ref 3.5–5.1)
Sodium: 137 mmol/L (ref 135–145)

## 2021-06-28 LAB — MAGNESIUM: Magnesium: 2 mg/dL (ref 1.7–2.4)

## 2021-06-28 MED ORDER — POTASSIUM CHLORIDE CRYS ER 20 MEQ PO TBCR
40.0000 meq | EXTENDED_RELEASE_TABLET | Freq: Once | ORAL | Status: AC
  Administered 2021-06-28: 40 meq via ORAL
  Filled 2021-06-28: qty 2

## 2021-06-28 MED ORDER — MAGNESIUM SULFATE 2 GM/50ML IV SOLN
2.0000 g | Freq: Once | INTRAVENOUS | Status: AC
  Administered 2021-06-28: 2 g via INTRAVENOUS
  Filled 2021-06-28: qty 50

## 2021-06-28 MED ORDER — DOFETILIDE 250 MCG PO CAPS
250.0000 ug | ORAL_CAPSULE | Freq: Two times a day (BID) | ORAL | 5 refills | Status: DC
  Filled 2021-06-28: qty 60, 30d supply, fill #0

## 2021-06-28 NOTE — Telephone Encounter (Signed)
Patient called to cancel his appt on 07/04/21 for  1 week Tikosyn f/u with Adline Peals, PA and his 1 month f/u with Dr. [name] on 08/01/21. Patient states that the [place] will not approve these visits because his Cardiologist at the [place] will be monitoring him on this medication.

## 2021-06-28 NOTE — Progress Notes (Signed)
Progress Note  Patient Name: Edward Mcdaniel Date of Encounter: 06/28/2021  [facility] EP: Dr. [name]  Subjective   NAEO. Feeling well.  Inpatient Medications    Scheduled Meds:  apixaban  5 mg Oral BID   dofetilide  250 mcg Oral BID   donepezil  5 mg Oral QHS   influenza vaccine adjuvanted  0.5 mL Intramuscular Tomorrow-1000   lisinopril  40 mg Oral q AM   loratadine  10 mg Oral q AM   metFORMIN  500 mg Oral Q breakfast   mirtazapine  7.5 mg Oral QHS   pantoprazole  40 mg Oral Daily   polyvinyl alcohol  1 drop Both Eyes QHS   pravastatin  40 mg Oral QHS   sodium chloride flush  3 mL Intravenous Q12H   tamsulosin  0.4 mg Oral QHS   Continuous Infusions:  sodium chloride     PRN Meds: sodium chloride, albuterol, sodium chloride flush   Vital Signs    Vitals:   06/27/21 1114 06/27/21 1417 06/27/21 2003 06/28/21 0548  BP: 134/74 125/83 123/80 133/82  Pulse:   60 (!) 56  Resp:  16 14 15   Temp:  97.9 F (36.6 C) 98.2 F (36.8 C) 98 F (36.7 C)  TempSrc:  Oral Oral Oral  SpO2:   96% 95%  Weight:      Height:       No intake or output data in the 24 hours ending 06/28/21 0657 Last 3 Weights 06/25/2021 06/25/2021 05/22/2021  Weight (lbs) 182 lb 9.6 oz 183 lb 9.6 oz 175 lb 6.4 oz  Weight (kg) 82.827 kg 83.28 kg 79.561 kg      Telemetry    After DCCV, sinus rhythm with rare PVC. Period of Wenckebach yesterday. No high degree AV block. - Personally Reviewed  ECG    Sinus. QTc 450 yesterday evening. - Personally Reviewed  Physical Exam    GEN: No acute distress.   Neck: No JVD Cardiac: regular rhythm, no murmurs, rubs, or gallops.  Respiratory: CTA b/l. GI: Soft, nontender, non-distended  MS: No edema; No deformity. Neuro:  Nonfocal  Psych: Normal affect   Labs    High Sensitivity Troponin:  No results for input(s): TROPONINIHS in the last 720 hours.   Chemistry Recent Labs  Lab 06/26/21 0232 06/27/21 0249 06/28/21 0222  NA 136 138 137   K 4.0 4.0 3.9  CL 105 106 106  CO2 24 26 25   GLUCOSE 109* 109* 109*  BUN 13 16 15   CREATININE 1.02 1.25* 1.10  CALCIUM 8.7* 8.7* 8.9  MG 1.9 2.1 2.0  GFRNONAA >60 57* >60  ANIONGAP 7 6 6      Lipids No results for input(s): CHOL, TRIG, HDL, LABVLDL, LDLCALC, CHOLHDL in the last 168 hours.  HematologyNo results for input(s): WBC, RBC, HGB, HCT, MCV, MCH, MCHC, RDW, PLT in the last 168 hours. Thyroid No results for input(s): TSH, FREET4 in the last 168 hours.  BNPNo results for input(s): BNP, PROBNP in the last 168 hours.  DDimer No results for input(s): DDIMER in the last 168 hours.   Radiology    No results found.  Cardiac Studies   02/16/21: EPS/Ablation CONCLUSIONS: 1. Successful PVI 2. Successful ablation/isolation of the posterior wall 3. Intracardiac echo reveals trivial pericardial effusion, normal LV function. 4. No early apparent complications. 5. Protonix 40mg  PO daily for 45 days   01/30/21: TTE IMPRESSIONS   1. Left ventricular ejection fraction, by estimation, is 55  to 60%. The  left ventricle has normal function. The left ventricle has no regional  wall motion abnormalities. There is mild concentric left ventricular  hypertrophy. Left ventricular diastolic  function could not be evaluated.   2. Right ventricular systolic function is normal. The right ventricular  size is mildly enlarged.   3. Left atrial size was moderately dilated.   4. The mitral valve is normal in structure. Mild mitral valve  regurgitation. No evidence of mitral stenosis.   5. The aortic valve is normal in structure. Aortic valve regurgitation is  mild. No aortic stenosis is present.   6. There is mild dilatation of the aortic root, measuring 39 mm. There is  mild dilatation of the ascending aorta, measuring 43 mm.   7. The inferior vena cava is normal in size with greater than 50%  respiratory variability, suggesting right atrial pressure of 3 mmHg.   Patient Profile     85 y.o.  male w/PMHx of HTN, DM, HLD, AFib admitted for Tikosyn initiation  Assessment & Plan    Persistent AFib CHA2DS2Vasc is 5, on Eliquis Tikosyn load to be completed this AM If QTc OK after AM dose, OK to discharge  2. HTN Home meds  3. DM Home meds   For questions or updates, please contact [place] Please consult [email or web address] for contact info under        Signed, Vickie Epley, MD  06/28/2021, 6:57 AM

## 2021-06-28 NOTE — Progress Notes (Signed)
Pharmacy: Dofetilide (Tikosyn) - Follow Up Assessment and Electrolyte Replacement  Pharmacy consulted to assist in monitoring and replacing electrolytes in this 85 y.o. male admitted on 06/25/2021 undergoing dofetilide initiation. First dofetilide dose: 06/25/21.  Labs:    Component Value Date/Time   K 3.9 06/28/2021 0222   MG 2.0 06/28/2021 0222     Plan: Potassium: K 3.8-3.9:  Give KCl 40 mEq po x1   Magnesium: Mg 1.8-2: Give Mg 2 gm IV x1   Copay $90 - fills planned for [facility], likely need initial fill through [facility]  Thank you for allowing pharmacy to participate in this patient's care   Erin Hearing PharmD., BCPS Clinical Pharmacist 06/28/2021 7:35 AM

## 2021-06-28 NOTE — Anesthesia Postprocedure Evaluation (Signed)
Anesthesia Post Note  Patient: Edward Mcdaniel  Procedure(s) Performed: CARDIOVERSION     Patient location during evaluation: Endoscopy Anesthesia Type: General Level of consciousness: awake and alert Pain management: pain level controlled Vital Signs Assessment: post-procedure vital signs reviewed and stable Respiratory status: spontaneous breathing, nonlabored ventilation, respiratory function stable and patient connected to nasal cannula oxygen Cardiovascular status: blood pressure returned to baseline and stable Postop Assessment: no apparent nausea or vomiting Anesthetic complications: no   No notable events documented.  Last Vitals:  Vitals:   06/28/21 0548 06/28/21 0800  BP: 133/82 (!) 146/77  Pulse: (!) 56 65  Resp: 15 16  Temp: 36.7 C 36.5 C  SpO2: 95% 95%    Last Pain:  Vitals:   06/28/21 0800  TempSrc: Oral  PainSc: 0-No pain                 Belenda Cruise P Mikela Senn

## 2021-07-04 ENCOUNTER — Ambulatory Visit (HOSPITAL_COMMUNITY): Payer: No Typology Code available for payment source | Admitting: Physician Assistant

## 2021-07-10 ENCOUNTER — Ambulatory Visit: Payer: No Typology Code available for payment source | Admitting: Dermatology

## 2021-08-01 ENCOUNTER — Ambulatory Visit: Payer: No Typology Code available for payment source | Admitting: Cardiology

## 2021-08-21 ENCOUNTER — Ambulatory Visit: Payer: Medicare HMO | Admitting: Dermatology

## 2021-08-21 ENCOUNTER — Encounter: Payer: Self-pay | Admitting: Dermatology

## 2021-08-21 ENCOUNTER — Other Ambulatory Visit: Payer: Self-pay

## 2021-08-21 DIAGNOSIS — L570 Actinic keratosis: Secondary | ICD-10-CM

## 2021-08-21 DIAGNOSIS — Z1283 Encounter for screening for malignant neoplasm of skin: Secondary | ICD-10-CM

## 2021-08-21 NOTE — Progress Notes (Addendum)
   Follow-Up Visit   Subjective  Edward Mcdaniel is a 85 y.o. male who presents for the following: Annual Exam (New spots on ears and face- scaly).  Annual skin examination, several new crusts Location:  Duration:  Quality:  Associated Signs/Symptoms: Modifying Factors:  Severity:  Timing: Context:   Objective  Well appearing patient in no apparent distress; mood and affect are within normal limits. Waist up skin examination.  No new or recurrent nonmelanoma skin cancers.  No atypical pigmented lesions.  Left Buccal Cheek, Left Preauricular Area, Left Superior Helix, Right Buccal Cheek, Right Preauricular Area, Right Superior Helix Half dozen 3 to 6 mm horny and gritty crusts   All skin waist up examined.   Assessment & Plan    AK (actinic keratosis) (6) Left Superior Helix; Right Superior Helix; Left Preauricular Area; Right Preauricular Area; Left Buccal Cheek; Right Buccal Cheek  Destruction of lesion - Left Buccal Cheek, Left Preauricular Area, Left Superior Helix, Right Buccal Cheek, Right Preauricular Area, Right Superior Helix Complexity: simple   Destruction method: cryotherapy   Informed consent: discussed and consent obtained   Timeout:  patient name, date of birth, surgical site, and procedure verified Lesion destroyed using liquid nitrogen: Yes   Cryotherapy cycles:  3 Outcome: patient tolerated procedure well with no complications   Post-procedure details: wound care instructions given    Encounter for screening for malignant neoplasm of skin  Annual skin examination.  LN2 x4 right ear x3 left ear x5 on face.  Keratoses on back require no intervention.   I, Lavonna Monarch, MD, have reviewed all documentation for this visit.  The documentation on 09/19/21 for the exam, diagnosis, procedures, and orders are all accurate and complete.

## 2021-08-28 ENCOUNTER — Other Ambulatory Visit (HOSPITAL_COMMUNITY): Payer: Self-pay

## 2021-09-16 ENCOUNTER — Encounter: Payer: Self-pay | Admitting: Dermatology

## 2021-10-18 ENCOUNTER — Encounter: Payer: Self-pay | Admitting: Dermatology

## 2021-10-18 ENCOUNTER — Ambulatory Visit: Payer: Medicare HMO | Admitting: Dermatology

## 2021-10-18 ENCOUNTER — Other Ambulatory Visit: Payer: Self-pay

## 2021-10-18 DIAGNOSIS — L821 Other seborrheic keratosis: Secondary | ICD-10-CM

## 2021-10-18 DIAGNOSIS — L570 Actinic keratosis: Secondary | ICD-10-CM

## 2021-10-18 DIAGNOSIS — Z1283 Encounter for screening for malignant neoplasm of skin: Secondary | ICD-10-CM

## 2021-10-18 DIAGNOSIS — Z85828 Personal history of other malignant neoplasm of skin: Secondary | ICD-10-CM

## 2021-10-18 DIAGNOSIS — R238 Other skin changes: Secondary | ICD-10-CM | POA: Diagnosis not present

## 2021-11-10 ENCOUNTER — Encounter: Payer: Self-pay | Admitting: Dermatology

## 2021-11-10 NOTE — Progress Notes (Signed)
° °  Follow-Up Visit   Subjective  Edward Mcdaniel is a 86 y.o. male who presents for the following: Follow-up (No new concerns.  Personal history of scc. ).  Multiple crusted spots, intact other areas Location:  Duration:  Quality:  Associated Signs/Symptoms: Modifying Factors:  Severity:  Timing: Context:   Objective  Well appearing patient in no apparent distress; mood and affect are within normal limits. Left Dorsal Hand, Left Forearm - Anterior (2), Left Parotid Area (2), Right Lower Vermilion Lip, Right Parotid Area, Right Scaphoid Fossa Multiple gritty plus hornlike 2 to 4 mm pink crusts  Scalp Waist up skin examination: No atypical pigmented lesions, no new or recurrent nonmelanoma skin cancer.      All skin waist up examined.   Assessment & Plan    Actinic keratosis (8) Left Forearm - Anterior (2); Left Dorsal Hand; Right Scaphoid Fossa; Right Lower Vermilion Lip; Left Parotid Area (2); Right Parotid Area  Destruction of lesion - Left Dorsal Hand, Left Forearm - Anterior, Left Parotid Area, Right Lower Vermilion Lip, Right Parotid Area, Right Scaphoid Fossa Complexity: simple   Destruction method: cryotherapy   Informed consent: discussed and consent obtained   Timeout:  patient name, date of birth, surgical site, and procedure verified Lesion destroyed using liquid nitrogen: Yes   Cryotherapy cycles:  3 Outcome: patient tolerated procedure well with no complications   Post-procedure details: wound care instructions given    Screening exam for skin cancer Scalp  R upper chest by scapula - sk   Crust on the lip - to be monitored: Patient unaware of 2 mm pink crusts So it may be inflammatory and self resolve.  To return if it grows or bleeds in the future.       I, Lavonna Monarch, MD, have reviewed all documentation for this visit.  The documentation on 11/10/21 for the exam, diagnosis, procedures, and orders are all accurate and complete.

## 2021-12-26 ENCOUNTER — Ambulatory Visit (INDEPENDENT_AMBULATORY_CARE_PROVIDER_SITE_OTHER): Payer: No Typology Code available for payment source | Admitting: Cardiology

## 2021-12-26 ENCOUNTER — Other Ambulatory Visit: Payer: Self-pay | Admitting: Cardiovascular Disease

## 2021-12-26 ENCOUNTER — Other Ambulatory Visit: Payer: Self-pay

## 2021-12-26 ENCOUNTER — Encounter: Payer: Self-pay | Admitting: Cardiology

## 2021-12-26 VITALS — BP 130/70 | HR 51 | Ht 70.0 in | Wt 183.0 lb

## 2021-12-26 DIAGNOSIS — I4819 Other persistent atrial fibrillation: Secondary | ICD-10-CM | POA: Diagnosis not present

## 2021-12-26 DIAGNOSIS — I5033 Acute on chronic diastolic (congestive) heart failure: Secondary | ICD-10-CM | POA: Diagnosis not present

## 2021-12-26 DIAGNOSIS — I10 Essential (primary) hypertension: Secondary | ICD-10-CM

## 2021-12-26 MED ORDER — FUROSEMIDE 20 MG PO TABS: 20.0000 mg | ORAL_TABLET | Freq: Every day | ORAL | 3 refills | Status: DC

## 2021-12-26 NOTE — Progress Notes (Signed)
?Electrophysiology Office Follow up Visit Note:   ? ?Date:  12/26/2021  ? ?ID:  Edward Mcdaniel, DOB October 02, 1936, MRN [id number] ? ?PCP:  [facility]  ?[facility] Cardiologist:  None  ?[facility] Electrophysiologist:  Vickie Epley, MD  ? ? ?Interval History:   ? ?Edward Mcdaniel is a 86 y.o. male who presents for a follow up visit.  He underwent an A-fib ablation on Feb 16, 2021.  During the ablation the veins and posterior wall were isolated.  He was started on Tikosyn after the ablation for recurrence and has done well on 250 mcg by mouth twice daily.  He has had follow-up at the [facility] his Tikosyn. ? ?He tells me that he has been out of rhythm recently.  He was seen at the [place] in [place] and was noted to be in typical atrial flutter.  He also showed some signs of volume overload. ? ? ?  ? ?Past Medical History:  ?Diagnosis Date  ? Allergic rhinitis   ? Asthma   ? DM type 2 (diabetes mellitus, type 2) ([place])   ? diet controlled  ? HTN (hypertension)   ? Hyperlipidemia   ? LV dysfunction   ? Persistent atrial fibrillation ([place])   ? Squamous cell carcinoma of skin 10/02/2011  ? right arm - tx p bx  ? Squamous cell carcinoma of skin 01/03/2014  ? in situ on right cheek - tx p bx  ? Squamous cell carcinoma of skin 07/08/2017  ? in situ on left hand - tx p bx  ? Squamous cell carcinoma of skin 03/25/2018  ? well differentiated on top of left hand - tx p bx  ? Squamous cell carcinoma of skin 02/15/2020  ? well differentiated on left zygomatic area cx3, excision  ? Squamous cell carcinoma of skin 02/15/2020  ? in situ on right mid antihelix cx3 3f  ? ? ?Past Surgical History:  ?Procedure Laterality Date  ? ATRIAL FIBRILLATION ABLATION N/A 02/16/2021  ? Procedure: ATRIAL FIBRILLATION ABLATION;  Surgeon: LVickie Epley MD;  [facility];  Service: Cardiovascular;  Laterality: N/A;  ? CARDIOVERSION N/A 06/27/2021  ? Procedure: CARDIOVERSION;  Surgeon: RSkeet Latch MD;   [facility]  Service: Cardiovascular;  Laterality: N/A;  ? HERNIA REPAIR    ? x 2  ? KNEE SURGERY    ? bilateral  ? LEFT HEART CATH AND CORONARY ANGIOGRAPHY N/A 12/14/2020  ? Procedure: LEFT HEART CATH AND CORONARY ANGIOGRAPHY;  Surgeon: HLeonie Man MD;  [facility];  Service: Cardiovascular;  Laterality: N/A;  ? RECTAL SURGERY    ? shoulder bone spur / tendon repair    ? ? ?Current Medications: ?Current Meds  ?Medication Sig  ? acetaminophen (TYLENOL) 500 MG tablet Take 500 mg by mouth every 6 (six) hours as needed for headache or mild pain.  ? albuterol (VENTOLIN HFA) 108 (90 Base) MCG/ACT inhaler Inhale 1-2 puffs into the lungs every 6 (six) hours as needed for wheezing or shortness of breath.  ? apixaban (ELIQUIS) 5 MG TABS tablet Take 5 mg by mouth 2 (two) times daily.  ? Carboxymethylcellulose Sod PF 1 % GEL Place 1 drop into both eyes at bedtime.  ? dofetilide (TIKOSYN) 250 MCG capsule Take 1 capsule (250 mcg total) by mouth 2 (two) times daily.  ? donepezil (ARICEPT) 5 MG tablet Take 5 mg by mouth at bedtime.  ? lisinopril (ZESTRIL) 40 MG tablet Take  40 mg by mouth in the morning.  ? loratadine (CLARITIN) 10 MG tablet Take 10 mg by mouth in the morning.  ? MENTHOL-METHYL SALICYLATE EX Apply 1 application topically 2 (two) times daily as needed (for joint pain.).  ? metFORMIN (GLUCOPHAGE-XR) 500 MG 24 hr tablet Take 500 mg by mouth in the morning.  ? mirtazapine (REMERON) 15 MG tablet Take 0.5 tablets by mouth at bedtime.  ? pantoprazole (PROTONIX) 40 MG tablet Take 1 tablet (40 mg total) by mouth daily.  ? pravastatin (PRAVACHOL) 40 MG tablet Take 40 mg by mouth at bedtime.  ? Propylene Glycol (SYSTANE BALANCE) 0.6 % SOLN Place 1 drop into both eyes 4 (four) times daily as needed (dry eyes).  ? tamsulosin (FLOMAX) 0.4 MG CAPS capsule Take 0.4 mg by mouth at bedtime.  ?  ? ?Allergies:   Amlodipine and Tape  ? ?Social History  ? ?Socioeconomic History  ? Marital status:  Widowed  ?  Spouse name: Not on file  ? Number of children: 3  ? Years of education: Not on file  ? Highest education level: Not on file  ?Occupational History  ? Occupation: retired > [employer]   ? Occupation: Retired  ?Tobacco Use  ? Smoking status: Former  ?  Packs/day: 1.50  ?  Years: 30.00  ?  Pack years: 45.00  ?  Types: Cigarettes  ?  Quit date: 10/08/1987  ?  Years since quitting: 34.2  ? Smokeless tobacco: Current  ?  Types: Chew  ?Vaping Use  ? Vaping Use: Never used  ?Substance and Sexual Activity  ? Alcohol use: Yes  ?  Comment: 2-4 shots of whiskey a few days a week  ? Drug use: Never  ? Sexual activity: Not Currently  ?Other Topics Concern  ? Not on file  ?Social History Narrative  ? veteran  ? ?Social Determinants of Health  ? ?Financial Resource Strain: Not on file  ?Food Insecurity: Not on file  ?Transportation Needs: Not on file  ?Physical Activity: Not on file  ?Stress: Not on file  ?Social Connections: Not on file  ?  ? ?Family History: ?The patient's family history includes Heart disease in his brother and mother; Lung cancer in his mother. ? ?ROS:   ?Please see the history of present illness.    ?All other systems reviewed and are negative. ? ?EKGs/Labs/Other Studies Reviewed:   ? ?The following studies were reviewed today: ? ? ?EKG:  The ekg ordered today demonstrates atypical atrial flutter with a ventricular rate of 51 bpm. ? ?Recent Labs: ?01/30/2021: Hemoglobin 15.0; Platelets 172 ?06/28/2021: BUN 15; Creatinine, Ser 1.10; Magnesium 2.0; Potassium 3.9; Sodium 137  ?Recent Lipid Panel ?   ?Component Value Date/Time  ? CHOL 103 12/13/2020 0248  ? TRIG 31 12/13/2020 0248  ? HDL 46 12/13/2020 0248  ? CHOLHDL 2.2 12/13/2020 0248  ? VLDL 6 12/13/2020 0248  ? [place] 51 12/13/2020 0248  ? ? ?Physical Exam:   ? ?VS:  BP 130/70 (BP Location: Left Arm, Patient Position: Sitting, Cuff Size: Normal)   Pulse (!) 51   Ht '5\' 10"'$  (1.778 m)   Wt 183 lb (83 kg)   SpO2 98%   BMI 26.26 kg/m?    ? ?Wt  Readings from Last 3 Encounters:  ?12/26/21 183 lb (83 kg)  ?06/25/21 182 lb 9.6 oz (82.8 kg)  ?06/25/21 183 lb 9.6 oz (83.3 kg)  ?  ? ?GEN: Well nourished, well developed in no acute distress ?HEENT:  Normal ?NECK: No JVD; No carotid bruits ?LYMPHATICS: No lymphadenopathy ?CARDIAC: RRR, no murmurs, rubs, gallops ?RESPIRATORY:  Clear to auscultation without rales, wheezing or rhonchi  ?ABDOMEN: Soft, non-tender, non-distended ?MUSCULOSKELETAL: Trace to 1+ pitting bilateral lower extremity edema; No deformity  ?SKIN: Warm and dry ?NEUROLOGIC:  Alert and oriented x 3 ?PSYCHIATRIC:  Normal affect  ? ? ? ?  ? ?ASSESSMENT:   ? ?1. Persistent atrial fibrillation ([place])   ?2. Primary hypertension   ?3. Acute on chronic diastolic heart failure ([place])   ? ?PLAN:   ? ?In order of problems listed above: ? ?#Persistent atrial fibrillation ?On Tikosyn.  He is out of rhythm today and an atypical appearing atrial flutter with a slow ventricular response of 51 bpm.  I discussed treatment strategies with him including redo ablation and cardioversion.  I would recommend we start with cardioversion.  I discussed the procedure in detail include the risks and he wishes to proceed.  He has not missed any doses of his Eliquis in the last 4 weeks. ? ?Given his slow ventricular response another reasonable treatment strategy that may be necessary in the near future is an AV junction ablation paired with pacemaker implant.  I discussed this briefly with him during today's visit. ? ?#Acute on chronic diastolic heart failure ?NYHA class II-III.  Slightly volume overloaded on exam.  I have asked him to restart his Lasix 20 mg by mouth once daily.  We will get blood work today including a BMP, BNP and magnesium.  He will follow-up with an APP in about 2 weeks. ? ?#Hypertension ?Controlled ?Continue current medical therapy.  1 year follow-up if rhythm is controlled ? ? ? ? ? ? ?Total time spent with patient today 43 minutes. This includes reviewing  records, evaluating the patient and coordinating care.  ? ?Medication Adjustments/Labs and Tests Ordered: ?Current medicines are reviewed at length with the patient today.  Concerns regarding medicines are outlined above.

## 2021-12-26 NOTE — Patient Instructions (Addendum)
Medications: ?Your physician recommends that you continue on your current medications as directed. Please refer to the Current Medication list given to you today. ?*If you need a refill on your cardiac medications before your next appointment, please call your pharmacy* ? ?Lab Work: ?BMP, BNP, Mag, CBC ?If you have labs (blood work) drawn today and your tests are completely normal, you will receive your results only by: ?MyChart Message (if you have MyChart) OR ?A paper copy in the mail ?If you have any lab test that is abnormal or we need to change your treatment, we will call you to review the results. ? ?Testing/Procedures: ?Your physician has recommended that you have a Cardioversion (DCCV). Electrical Cardioversion uses a jolt of electricity to your heart either through paddles or wired patches attached to your chest. This is a controlled, usually prescheduled, procedure. Defibrillation is done under light anesthesia in the hospital, and you usually go home the day of the procedure. This is done to get your heart back into a normal rhythm. You are not awake for the procedure. Please see the instruction sheet given to you today.  ? ?Follow-Up: ?At [facility], you and your health needs are our priority.  As part of our continuing mission to provide you with exceptional heart care, we have created designated Provider Care Teams.  These Care Teams include your primary Cardiologist (physician) and Advanced Practice Providers (APPs -  Physician Assistants and Nurse Practitioners) who all work together to provide you with the care you need, when you need it. ? ?Your physician wants you to follow-up in: 1 month post Cardioversion (12/27/21) with one of the following Advanced Practice Providers on your designated Care Team:   ? ?Ignacia Bayley, NP ?Christell Faith PA ?Cadence Kathlen Mody PA ? ?We recommend signing up for the patient portal called "MyChart".  Sign up information is provided on this After Visit Summary.  MyChart is  used to connect with patients for Virtual Visits (Telemedicine).  Patients are able to view lab/test results, encounter notes, upcoming appointments, etc.  Non-urgent messages can be sent to your provider as well.   ?To learn more about what you can do with MyChart, go to [email or web address].   ? ?Any Other Special Instructions Will Be Listed Below (If Applicable).  ? ?You are scheduled for a Cardioversion on March 23 with Dr.Gollan ?Please arrive at the [facility] at 6:30 am a.m. on the day of your procedure. ? ?DIET INSTRUCTIONS:  ?Nothing to eat or drink after midnight except your medications with a              sip of water. ? ? ?Labs: Today ? ?Medications:  Hold Metformin until after your Cardioversion YOU MAY TAKE ALL of your remaining medications with a small amount of water. ? ?Must have a responsible person to drive you home. ? ?Bring a current list of your medications and current insurance cards.  ? ? ?If you have any questions after you get home, please call the office at [phone]  ? ? ?Electrical Cardioversion ?Electrical cardioversion is the delivery of a jolt of electricity to restore a normal rhythm to the heart. A rhythm that is too fast or is not regular keeps the heart from pumping well. In this procedure, sticky patches or metal paddles are placed on the chest to deliver electricity to the heart from a device. ?This procedure may be done in an emergency if: ?There is low or no blood pressure as a result  of the heart rhythm. ?Normal rhythm must be restored as fast as possible to protect the brain and heart from further damage. ?It may save a life. ?This may also be a scheduled procedure for irregular or fast heart rhythms that are not immediately life-threatening. ?Tell a health care provider about: ?Any allergies you have. ?All medicines you are taking, including vitamins, herbs, eye drops, creams, and over-the-counter medicines. ?Any problems you or family members have had with  anesthetic medicines. ?Any blood disorders you have. ?Any surgeries you have had. ?Any medical conditions you have. ?Whether you are pregnant or may be pregnant. ?What are the risks? ?Generally, this is a safe procedure. However, problems may occur, including: ?Allergic reactions to medicines. ?A blood clot that breaks free and travels to other parts of your body. ?The possible return of an abnormal heart rhythm within hours or days after the procedure. ?Your heart stopping (cardiac arrest). This is rare. ?What happens before the procedure? ?Medicines ?Your health care provider may have you start taking: ?Blood-thinning medicines (anticoagulants) so your blood does not clot as easily. ?Medicines to help stabilize your heart rate and rhythm. ?Ask your health care provider about: ?Changing or stopping your regular medicines. This is especially important if you are taking diabetes medicines or blood thinners. ?Taking medicines such as aspirin and ibuprofen. These medicines can thin your blood. Do not take these medicines unless your health care provider tells you to take them. ?Taking over-the-counter medicines, vitamins, herbs, and supplements. ?General instructions ?Follow instructions from your health care provider about eating or drinking restrictions. ?Plan to have someone take you home from the hospital or clinic. ?If you will be going home right after the procedure, plan to have someone with you for 24 hours. ?Ask your health care provider what steps will be taken to help prevent infection. These may include washing your skin with a germ-killing soap. ?What happens during the procedure? ? ?An IV will be inserted into one of your veins. ?Sticky patches (electrodes) or metal paddles may be placed on your chest. ?You will be given a medicine to help you relax (sedative). ?An electrical shock will be delivered. ?The procedure may vary among health care providers and hospitals. ?What can I expect after the  procedure? ?Your blood pressure, heart rate, breathing rate, and blood oxygen level will be monitored until you leave the hospital or clinic. ?Your heart rhythm will be watched to make sure it does not change. ?You may have some redness on the skin where the shocks were given. ?Follow these instructions at home: ?Do not drive for 24 hours if you were given a sedative during your procedure. ?Take over-the-counter and prescription medicines only as told by your health care provider. ?Ask your health care provider how to check your pulse. Check it often. ?Rest for 48 hours after the procedure or as told by your health care provider. ?Avoid or limit your caffeine use as told by your health care provider. ?Keep all follow-up visits as told by your health care provider. This is important. ?Contact a health care provider if: ?You feel like your heart is beating too quickly or your pulse is not regular. ?You have a serious muscle cramp that does not go away. ?Get help right away if: ?You have discomfort in your chest. ?You are dizzy or you feel faint. ?You have trouble breathing or you are short of breath. ?Your speech is slurred. ?You have trouble moving an arm or leg on one side of your body. ?  Your fingers or toes turn cold or blue. ?Summary ?Electrical cardioversion is the delivery of a jolt of electricity to restore a normal rhythm to the heart. ?This procedure may be done right away in an emergency or may be a scheduled procedure if the condition is not an emergency. ?Generally, this is a safe procedure. ?After the procedure, check your pulse often as told by your health care provider. ?This information is not intended to replace advice given to you by your health care provider. Make sure you discuss any questions you have with your health care provider. ?Document Revised: 04/26/2019 Document Reviewed: 04/26/2019 ?Elsevier Patient Education ? 2022 [employer]. ? ? ? ? ?

## 2021-12-27 ENCOUNTER — Other Ambulatory Visit: Payer: Self-pay

## 2021-12-27 ENCOUNTER — Ambulatory Visit: Payer: No Typology Code available for payment source | Admitting: Certified Registered"

## 2021-12-27 ENCOUNTER — Ambulatory Visit
Admission: RE | Admit: 2021-12-27 | Discharge: 2021-12-27 | Disposition: A | Discharge status: Home / Self Care | Payer: No Typology Code available for payment source | Attending: Cardiovascular Disease | Admitting: Cardiovascular Disease

## 2021-12-27 ENCOUNTER — Encounter: Payer: Self-pay | Admitting: Cardiovascular Disease

## 2021-12-27 ENCOUNTER — Encounter
Admission: RE | Disposition: A | Discharge status: Home / Self Care | Payer: Self-pay | Source: Home / Self Care | Attending: Cardiovascular Disease

## 2021-12-27 DIAGNOSIS — Z87891 Personal history of nicotine dependence: Secondary | ICD-10-CM | POA: Diagnosis not present

## 2021-12-27 DIAGNOSIS — I4892 Unspecified atrial flutter: Secondary | ICD-10-CM | POA: Diagnosis present

## 2021-12-27 DIAGNOSIS — J45909 Unspecified asthma, uncomplicated: Secondary | ICD-10-CM | POA: Diagnosis not present

## 2021-12-27 DIAGNOSIS — R001 Bradycardia, unspecified: Secondary | ICD-10-CM

## 2021-12-27 DIAGNOSIS — I10 Essential (primary) hypertension: Secondary | ICD-10-CM | POA: Diagnosis not present

## 2021-12-27 DIAGNOSIS — I4891 Unspecified atrial fibrillation: Secondary | ICD-10-CM

## 2021-12-27 DIAGNOSIS — G4730 Sleep apnea, unspecified: Secondary | ICD-10-CM | POA: Diagnosis not present

## 2021-12-27 DIAGNOSIS — I4819 Other persistent atrial fibrillation: Secondary | ICD-10-CM

## 2021-12-27 DIAGNOSIS — E785 Hyperlipidemia, unspecified: Secondary | ICD-10-CM | POA: Diagnosis not present

## 2021-12-27 DIAGNOSIS — E119 Type 2 diabetes mellitus without complications: Secondary | ICD-10-CM | POA: Insufficient documentation

## 2021-12-27 DIAGNOSIS — I483 Typical atrial flutter: Secondary | ICD-10-CM

## 2021-12-27 DIAGNOSIS — Z7984 Long term (current) use of oral hypoglycemic drugs: Secondary | ICD-10-CM | POA: Diagnosis not present

## 2021-12-27 HISTORY — PX: CARDIOVERSION: SHX1299

## 2021-12-27 LAB — BRAIN NATRIURETIC PEPTIDE: BNP: 392.7 pg/mL — ABNORMAL HIGH (ref 0.0–100.0)

## 2021-12-27 LAB — BASIC METABOLIC PANEL
BUN/Creatinine Ratio: 13 (ref 10–24)
BUN: 16 mg/dL (ref 8–27)
CO2: 22 mmol/L (ref 20–29)
Calcium: 9.5 mg/dL (ref 8.6–10.2)
Chloride: 103 mmol/L (ref 96–106)
Creatinine, Ser: 1.24 mg/dL (ref 0.76–1.27)
Glucose: 96 mg/dL (ref 70–99)
Potassium: 4.8 mmol/L (ref 3.5–5.2)
Sodium: 139 mmol/L (ref 134–144)
eGFR: 58 mL/min/{1.73_m2} — ABNORMAL LOW (ref >59)

## 2021-12-27 LAB — CBC
Hematocrit: 45.3 % (ref 37.5–51.0)
Hemoglobin: 15.7 g/dL (ref 13.0–17.7)
MCH: 33.1 pg — ABNORMAL HIGH (ref 26.6–33.0)
MCHC: 34.7 g/dL (ref 31.5–35.7)
MCV: 96 fL (ref 79–97)
Platelets: 148 10*3/uL — ABNORMAL LOW (ref 150–450)
RBC: 4.74 x10E6/uL (ref 4.14–5.80)
RDW: 13.1 % (ref 11.6–15.4)
WBC: 5.6 10*3/uL (ref 3.4–10.8)

## 2021-12-27 LAB — GLUCOSE, CAPILLARY: Glucose-Capillary: 146 mg/dL — ABNORMAL HIGH (ref 70–99)

## 2021-12-27 LAB — MAGNESIUM: Magnesium: 2.2 mg/dL (ref 1.6–2.3)

## 2021-12-27 SURGERY — CARDIOVERSION
Anesthesia: General

## 2021-12-27 MED ORDER — SODIUM CHLORIDE 0.9 % IV SOLN: INTRAVENOUS | Status: DC

## 2021-12-27 MED ORDER — FENTANYL CITRATE (PF) 100 MCG/2ML IJ SOLN: 25.0000 ug | INTRAMUSCULAR | Status: DC | PRN

## 2021-12-27 MED ORDER — ONDANSETRON HCL 4 MG/2ML IJ SOLN: 4.0000 mg | Freq: Once | INTRAMUSCULAR | Status: DC | PRN

## 2021-12-27 MED ORDER — PROPOFOL 10 MG/ML IV BOLUS
INTRAVENOUS | Status: AC
  Filled 2021-12-27: qty 20

## 2021-12-27 MED ORDER — PROPOFOL 10 MG/ML IV BOLUS
INTRAVENOUS | Status: DC | PRN
  Administered 2021-12-27: 50 mg via INTRAVENOUS

## 2021-12-27 NOTE — Anesthesia Preprocedure Evaluation (Signed)
Anesthesia Evaluation  ?Patient identified by MRN, date of birth, ID band ?Patient awake ? ? ? ?Reviewed: ?Allergy & Precautions, H&P , NPO status , Patient's Chart, lab work & pertinent test results, reviewed documented beta blocker date and time  ? ?Airway ?Mallampati: III ? ? ?Neck ROM: full ? ? ? Dental ? ?(+) Poor Dentition ?  ?Pulmonary ?asthma , sleep apnea and Continuous Positive Airway Pressure Ventilation , former smoker,  ?  ?Pulmonary exam normal ? ? ? ? ? ? ? Cardiovascular ?Exercise Tolerance: Good ?hypertension, On Medications ?+ angina with exertion Atrial Fibrillation  ?Rhythm:regular Rate:Normal ? ? ?  ?Neuro/Psych ?negative neurological ROS ? negative psych ROS  ? GI/Hepatic ?negative GI ROS, Neg liver ROS,   ?Endo/Other  ?negative endocrine ROSdiabetes, Well Controlled, Type 2, Oral Hypoglycemic Agents ? Renal/GU ?negative Renal ROS  ?negative genitourinary ?  ?Musculoskeletal ? ? Abdominal ?  ?Peds ? Hematology ?negative hematology ROS ?(+)   ?Anesthesia Other Findings ?Past Medical History: ?No date: Allergic rhinitis ?No date: Asthma ?No date: DM type 2 (diabetes mellitus, type 2) ([place]) ?    Comment:  diet controlled ?No date: HTN (hypertension) ?No date: Hyperlipidemia ?No date: LV dysfunction ?No date: Persistent atrial fibrillation ([place]) ?10/02/2011: Squamous cell carcinoma of skin ?    Comment:  right arm - tx p bx ?01/03/2014: Squamous cell carcinoma of skin ?    Comment:  in situ on right cheek - tx p bx ?07/08/2017: Squamous cell carcinoma of skin ?    Comment:  in situ on left hand - tx p bx ?03/25/2018: Squamous cell carcinoma of skin ?    Comment:  well differentiated on top of left hand - tx p bx ?02/15/2020: Squamous cell carcinoma of skin ?    Comment:  well differentiated on left zygomatic area cx3, excision ?Past Surgical History: ?02/16/2021: ATRIAL FIBRILLATION ABLATION; N/A ?    Comment:  Procedure: ATRIAL FIBRILLATION ABLATION;  Surgeon:  ?              Vickie Epley, MD;  Location: [place] [facility];   ?             Service: Cardiovascular;  Laterality: N/A; ?06/27/2021: CARDIOVERSION; N/A ?    Comment:  Procedure: CARDIOVERSION;  Surgeon: [name],  ?             MD;  Location: [facility];  Service: Cardiovascular;   ?             Laterality: N/A; ?No date: HERNIA REPAIR ?    Comment:  x 2 ?No date: KNEE SURGERY ?    Comment:  bilateral ?12/14/2020: LEFT HEART CATH AND CORONARY ANGIOGRAPHY; N/A ?    Comment:  Procedure: LEFT HEART CATH AND CORONARY ANGIOGRAPHY;   ?             Surgeon: Leonie Man, MD;  Location: [facility]  ?             LAB;  Service: Cardiovascular;  Laterality: N/A; ?No date: RECTAL SURGERY ?No date: shoulder bone spur / tendon repair ? ? Reproductive/Obstetrics ?negative OB ROS ? ?  ? ? ? ? ? ? ? ? ? ? ? ? ? ?  ?  ? ? ? ? ? ? ? ? ?Anesthesia Physical ?Anesthesia Plan ? ?ASA: 4 ? ?Anesthesia Plan: General  ? ?Post-op Pain Management:   ? ?Induction:  ? ?PONV Risk Score and Plan:  ? ?Airway  Management Planned:  ? ?Additional Equipment:  ? ?Intra-op Plan:  ? ?Post-operative Plan:  ? ?Informed Consent: I have reviewed the patients History and Physical, chart, labs and discussed the procedure including the risks, benefits and alternatives for the proposed anesthesia with the patient or authorized representative who has indicated his/her understanding and acceptance.  ? ? ? ?Dental Advisory Given ? ?Plan Discussed with: CRNA ? ?Anesthesia Plan Comments:   ? ? ? ? ? ? ?Anesthesia Quick Evaluation ? ?

## 2021-12-27 NOTE — Transfer of Care (Signed)
Immediate Anesthesia Transfer of Care Note ? ?Patient: Edward Mcdaniel ? ?Procedure(s) Performed: CARDIOVERSION ? ?Patient Location: PACU and Cath Lab ? ?Anesthesia Type:General ? ?Level of Consciousness: awake ? ?Airway & Oxygen Therapy: Patient Spontanous Breathing ? ?Post-op Assessment: Report given to RN ? ?Post vital signs: stable ? ?Last Vitals:  ?Vitals Value Taken Time  ?BP 138/87 12/27/21 0743  ?Temp    ?Pulse    ?Resp    ?SpO2    ? ? ?Last Pain:  ?Vitals:  ? 12/27/21 0708  ?TempSrc: Oral  ?PainSc: 0-No pain  ?   ? ?  ? ?Complications: No notable events documented. ?

## 2021-12-27 NOTE — CV Procedure (Signed)
Cardioversion procedure note For atrial flutter  Procedure Details:  Consent: Risks of procedure as well as the alternatives and risks of each were explained to the (patient/caregiver).  Consent for procedure obtained.  Time Out: Verified patient identification, verified procedure, site/side was marked, verified correct patient position, special equipment/implants available, medications/allergies/relevent history reviewed, required imaging and test results available.  Performed  Patient placed on cardiac monitor, pulse oximetry, supplemental oxygen as necessary.   Sedation given: propofol IV, Dr. Adams Pacer pads placed anterior and posterior chest.   Cardioverted 1 time(s).   Cardioverted at  120 J. Synchronized biphasic Converted to NSR   Evaluation: Findings: Post procedure EKG shows: NSR Complications: None Patient did tolerate procedure well.  Time Spent Directly with the Patient:  45 minutes   Tim Johanna Stafford, M.D., Ph.D.  

## 2022-01-01 NOTE — Anesthesia Postprocedure Evaluation (Signed)
Anesthesia Post Note ? ?Patient: Edward Mcdaniel ? ?Procedure(s) Performed: CARDIOVERSION ? ?Anesthesia Type: General ?Anesthetic complications: no ? ? ?No notable events documented. ? ? ?Last Vitals:  ?Vitals:  ? 12/27/21 0800 12/27/21 0815  ?BP: 123/73 128/71  ?Pulse: (!) 39 (!) 38  ?Resp: 11 15  ?Temp:    ?SpO2: 99% 98%  ?  ?Last Pain:  ?Vitals:  ? 12/27/21 0708  ?TempSrc: Oral  ?PainSc: 0-No pain  ? ? ?  ?  ?  ?  ?  ?  ? ?[name] ? ? ? ? ?

## 2022-01-23 ENCOUNTER — Encounter: Payer: Self-pay | Admitting: Cardiology

## 2022-01-23 ENCOUNTER — Ambulatory Visit (INDEPENDENT_AMBULATORY_CARE_PROVIDER_SITE_OTHER): Payer: No Typology Code available for payment source | Admitting: Cardiology

## 2022-01-23 VITALS — BP 130/80 | HR 44 | Ht 70.0 in | Wt 180.0 lb

## 2022-01-23 DIAGNOSIS — I10 Essential (primary) hypertension: Secondary | ICD-10-CM

## 2022-01-23 DIAGNOSIS — I4819 Other persistent atrial fibrillation: Secondary | ICD-10-CM

## 2022-01-23 DIAGNOSIS — Z79899 Other long term (current) drug therapy: Secondary | ICD-10-CM

## 2022-01-23 MED ORDER — FUROSEMIDE 20 MG PO TABS: 20.0000 mg | ORAL_TABLET | Freq: Every day | ORAL | 3 refills | Status: DC | PRN

## 2022-01-23 NOTE — Progress Notes (Signed)
?Electrophysiology Office Follow up Visit Note:   ? ?Date:  01/23/2022  ? ?ID:  Edward Mcdaniel, DOB November 16, 1935, MRN [id number] ? ?PCP:  [facility]  ?[facility] Cardiologist:  None  ?[facility] Electrophysiologist:  Vickie Epley, MD  ? ? ?Interval History:   ? ?Edward Mcdaniel is a 86 y.o. male who presents for a follow up visit. He underwent PVI on 02/16/2021. During that ablation the veins and posterior wall were ablated. Unfortunately he had recurrence of his arrhythmia following the ablation. He was started on dofetilide. I saw him in follow up 12/26/2021 and planned DCCV. DCCV was performed 12/27/2021. Post DCCV he was in sinus bradycardia with HR 40 and a LBBB. He also has a first degree AV delay.  ? ? ? ? ? ?  ? ?Past Medical History:  ?Diagnosis Date  ? Allergic rhinitis   ? Asthma   ? DM type 2 (diabetes mellitus, type 2) ([place])   ? diet controlled  ? HTN (hypertension)   ? Hyperlipidemia   ? LV dysfunction   ? Persistent atrial fibrillation ([place])   ? Squamous cell carcinoma of skin 10/02/2011  ? right arm - tx p bx  ? Squamous cell carcinoma of skin 01/03/2014  ? in situ on right cheek - tx p bx  ? Squamous cell carcinoma of skin 07/08/2017  ? in situ on left hand - tx p bx  ? Squamous cell carcinoma of skin 03/25/2018  ? well differentiated on top of left hand - tx p bx  ? Squamous cell carcinoma of skin 02/15/2020  ? well differentiated on left zygomatic area cx3, excision  ? ? ?Past Surgical History:  ?Procedure Laterality Date  ? ATRIAL FIBRILLATION ABLATION N/A 02/16/2021  ? Procedure: ATRIAL FIBRILLATION ABLATION;  Surgeon: Vickie Epley, MD;  Location: [facility];  Service: Cardiovascular;  Laterality: N/A;  ? CARDIOVERSION N/A 06/27/2021  ? Procedure: CARDIOVERSION;  Surgeon: [name], MD;  Location: [place];  Service: Cardiovascular;  Laterality: N/A;  ? CARDIOVERSION N/A 12/27/2021  ? Procedure: CARDIOVERSION;  Surgeon: Minna Merritts, MD;   Location: [facility];  Service: Cardiovascular;  Laterality: N/A;  ? HERNIA REPAIR    ? x 2  ? KNEE SURGERY    ? bilateral  ? LEFT HEART CATH AND CORONARY ANGIOGRAPHY N/A 12/14/2020  ? Procedure: LEFT HEART CATH AND CORONARY ANGIOGRAPHY;  Surgeon: Leonie Man, MD;  Location: [place] [facility];  Service: Cardiovascular;  Laterality: N/A;  ? RECTAL SURGERY    ? shoulder bone spur / tendon repair    ? ? ?Current Medications: ?Current Meds  ?Medication Sig  ? acetaminophen (TYLENOL) 500 MG tablet Take 1,000 mg by mouth every 6 (six) hours as needed for headache or mild pain.  ? albuterol (VENTOLIN HFA) 108 (90 Base) MCG/ACT inhaler Inhale 1-2 puffs into the lungs every 6 (six) hours as needed for wheezing or shortness of breath.  ? apixaban (ELIQUIS) 5 MG TABS tablet Take 5 mg by mouth 2 (two) times daily.  ? Carboxymethylcellulose Sod PF 1 % GEL Place 1 drop into both eyes at bedtime.  ? dofetilide (TIKOSYN) 250 MCG capsule Take 1 capsule (250 mcg total) by mouth 2 (two) times daily.  ? donepezil (ARICEPT) 5 MG tablet Take 5 mg by mouth at bedtime.  ? furosemide (LASIX) 20 MG tablet Take 1 tablet (20 mg total) by mouth daily.  ? lisinopril (ZESTRIL) 40 MG tablet Take 40  mg by mouth in the morning.  ? loratadine (CLARITIN) 10 MG tablet Take 10 mg by mouth in the morning.  ? MENTHOL-METHYL SALICYLATE EX Apply 1 application topically 2 (two) times daily as needed (for joint pain.).  ? metFORMIN (GLUCOPHAGE-XR) 500 MG 24 hr tablet Take 500 mg by mouth in the morning.  ? mirtazapine (REMERON) 15 MG tablet Take 0.5 tablets by mouth at bedtime.  ? pantoprazole (PROTONIX) 40 MG tablet Take 1 tablet (40 mg total) by mouth daily.  ? pravastatin (PRAVACHOL) 40 MG tablet Take 40 mg by mouth at bedtime.  ? Propylene Glycol (SYSTANE BALANCE) 0.6 % SOLN Place 1 drop into both eyes 4 (four) times daily as needed (dry eyes).  ? tamsulosin (FLOMAX) 0.4 MG CAPS capsule Take 0.4 mg by mouth at bedtime.  ?  ? ?Allergies:   Amlodipine  and Tape  ? ?Social History  ? ?Socioeconomic History  ? Marital status: Widowed  ?  Spouse name: Not on file  ? Number of children: 3  ? Years of education: Not on file  ? Highest education level: Not on file  ?Occupational History  ? Occupation: retired > [employer]   ? Occupation: Retired  ?Tobacco Use  ? Smoking status: Former  ?  Packs/day: 1.50  ?  Years: 30.00  ?  Pack years: 45.00  ?  Types: Cigarettes  ?  Quit date: 10/08/1987  ?  Years since quitting: 34.3  ? Smokeless tobacco: Current  ?  Types: Chew  ?Vaping Use  ? Vaping Use: Never used  ?Substance and Sexual Activity  ? Alcohol use: Yes  ?  Comment: 2 shots a week whiskey  ? Drug use: Never  ? Sexual activity: Not Currently  ?Other Topics Concern  ? Not on file  ?Social History Narrative  ? veteran  ? ?Social Determinants of Health  ? ?Financial Resource Strain: Not on file  ?Food Insecurity: Not on file  ?Transportation Needs: Not on file  ?Physical Activity: Not on file  ?Stress: Not on file  ?Social Connections: Not on file  ?  ? ?Family History: ?The patient's family history includes Heart disease in his brother and mother; Lung cancer in his mother. ? ?ROS:   ?Please see the history of present illness.    ?All other systems reviewed and are negative. ? ?EKGs/Labs/Other Studies Reviewed:   ? ?The following studies were reviewed today: ? ?EKG today personally reviewed and shows sinus bradycardia with a ventricular to 44 bpm.  QTc is 430 ms. ? ?Recent Labs: ?12/26/2021: BNP 392.7; BUN 16; Creatinine, Ser 1.24; Hemoglobin 15.7; Magnesium 2.2; Platelets 148; Potassium 4.8; Sodium 139  ?Recent Lipid Panel ?   ?Component Value Date/Time  ? CHOL 103 12/13/2020 0248  ? TRIG 31 12/13/2020 0248  ? HDL 46 12/13/2020 0248  ? CHOLHDL 2.2 12/13/2020 0248  ? VLDL 6 12/13/2020 0248  ? [place] 51 12/13/2020 0248  ? ? ?Physical Exam:   ? ?VS:  BP 130/80 (BP Location: Left Arm, Patient Position: Sitting, Cuff Size: Normal)   Pulse (!) 44   Ht '5\' 10"'$  (1.778 m)   Wt 180  lb (81.6 kg)   SpO2 98%   BMI 25.83 kg/m?    ? ?Wt Readings from Last 3 Encounters:  ?01/23/22 180 lb (81.6 kg)  ?12/26/21 183 lb (83 kg)  ?06/25/21 182 lb 9.6 oz (82.8 kg)  ?  ? ?GEN:  Well nourished, well developed in no acute distress ?HEENT: Normal ?NECK: No JVD;  No carotid bruits ?LYMPHATICS: No lymphadenopathy ?CARDIAC: RRR, no murmurs, rubs, gallops ?RESPIRATORY:  Clear to auscultation without rales, wheezing or rhonchi  ?ABDOMEN: Soft, non-tender, non-distended ?MUSCULOSKELETAL:  No edema; No deformity  ?SKIN: Warm and dry ?NEUROLOGIC:  Alert and oriented x 3 ?PSYCHIATRIC:  Normal affect  ? ? ? ?  ? ?ASSESSMENT:   ? ?1. Persistent atrial fibrillation ([place])   ?2. Primary hypertension   ?3. Encounter for long-term (current) use of high-risk medication   ? ?PLAN:   ? ?In order of problems listed above: ? ? ?#Persistent AF ?Maintaining sinus rhythm after cardioversion.  On Tikosyn to 50 mcg by mouth twice daily.  QTc acceptable for continued Tikosyn use.  Recent blood work stable.  We will have him touch base with [place] in 4 months with blood work at the next visit.  Continue Eliquis for stroke prophylaxis. ? ?#Hypertension ?Controlled.  Continue current regimen.  He is transitioning to Lasix as needed.  He will monitor daily weights. ? ?Follow-up 4 months.  Blood work at that visit. ? ? ? ?Medication Adjustments/Labs and Tests Ordered: ?Current medicines are reviewed at length with the patient today.  Concerns regarding medicines are outlined above.  ?No orders of the defined types were placed in this encounter. ? ?No orders of the defined types were placed in this encounter. ? ? ? ?Signed, ?Lars Mage, MD, [facility], [facility] ?01/23/2022 11:56 AM    ?Electrophysiology ?[place] ?

## 2022-01-23 NOTE — Patient Instructions (Addendum)
Medications: ?Take Lasix 20 mg daily as needed for weight gain ?Your physician recommends that you continue on your current medications as directed. Please refer to the Current Medication list given to you today. ?*If you need a refill on your cardiac medications before your next appointment, please call your pharmacy* ? ?Lab Work: ?None. ?If you have labs (blood work) drawn today and your tests are completely normal, you will receive your results only by: ?MyChart Message (if you have MyChart) OR ?A paper copy in the mail ?If you have any lab test that is abnormal or we need to change your treatment, we will call you to review the results. ? ?Testing/Procedures: ?None. ? ?Follow-Up: ?At [facility], you and your health needs are our priority.  As part of our continuing mission to provide you with exceptional heart care, we have created designated Provider Care Teams.  These Care Teams include your primary Cardiologist (physician) and Advanced Practice Providers (APPs -  Physician Assistants and Nurse Practitioners) who all work together to provide you with the care you need, when you need it. ? ?Your physician wants you to follow-up in: 4 months with Lars Mage  ? ?We recommend signing up for the patient portal called "MyChart".  Sign up information is provided on this After Visit Summary.  MyChart is used to connect with patients for Virtual Visits (Telemedicine).  Patients are able to view lab/test results, encounter notes, upcoming appointments, etc.  Non-urgent messages can be sent to your provider as well.   ?To learn more about what you can do with MyChart, go to [email or web address].   ? ?Any Other Special Instructions Will Be Listed Below (If Applicable).  ? ?

## 2022-03-01 IMAGING — CT CT HEART MORPH/PULM VEIN W/ CM & W/O CA SCORE
1 series · 2 of 2 positions shown, 3 images · non-contrast
Comparison: None.
COMPARISON: None.

Addendum:
EXAM:
OVER-READ INTERPRETATION  CT CHEST

The following report is an over-read performed by radiologist Dr.
Bambucafe Tarla [[email or web address]] on 02/09/2021. This
over-read does not include interpretation of cardiac or coronary
anatomy or pathology. The coronary calcium score/coronary CTA
interpretation by the cardiologist is attached.
CLINICAL DATA: Atrial fibrillation scheduled for ablation.
Cardiac CTA
TECHNIQUE: A non-contrast, gated CT scan was obtained with axial slices of 3 mm
through the heart for calcium scoring. Calcium scoring was performed
using the Agatston method. A 120 kV retrospective, gated, contrast
cardiac scan was obtained. Gantry rotation speed was 250 msecs and
collimation was 0.6 mm. Nitroglycerin was not given. A delayed scan
was obtained 60 seconds after contrast injection to exclude left
atrial appendage thrombus. The 3D dataset was reconstructed in 5%
intervals of the 0-95% of the R-R cycle. Systolic and diastolic
phases were analyzed on a dedicated workstation using MPR, MIP, and
VRT modes. The patient received 80 cc of contrast.

[Series 741: — · 0.46mm/px · 2 of 2 slices shown, 3 images]
[im 1/2  vessel]
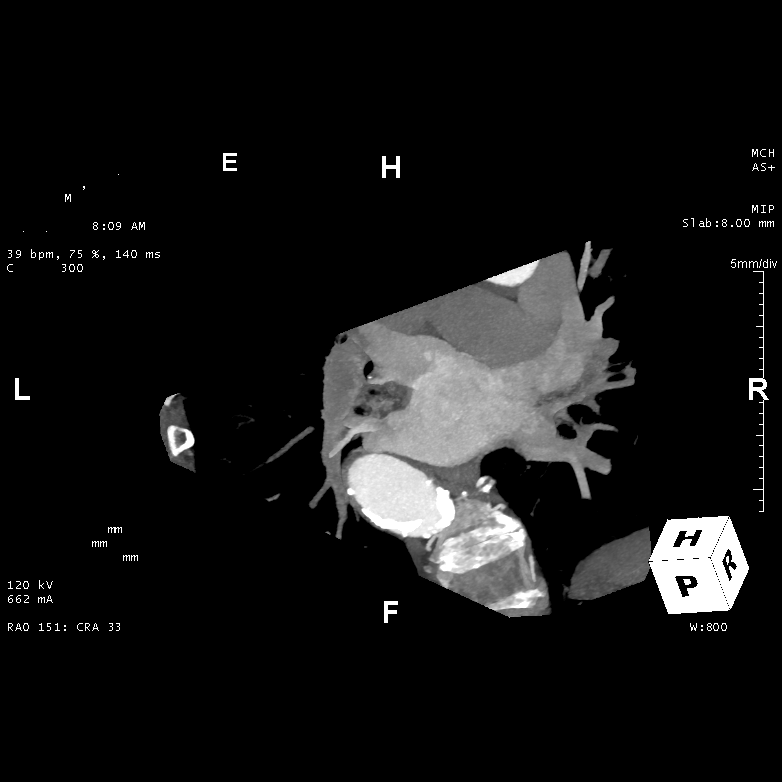
[im 1/2  lung]
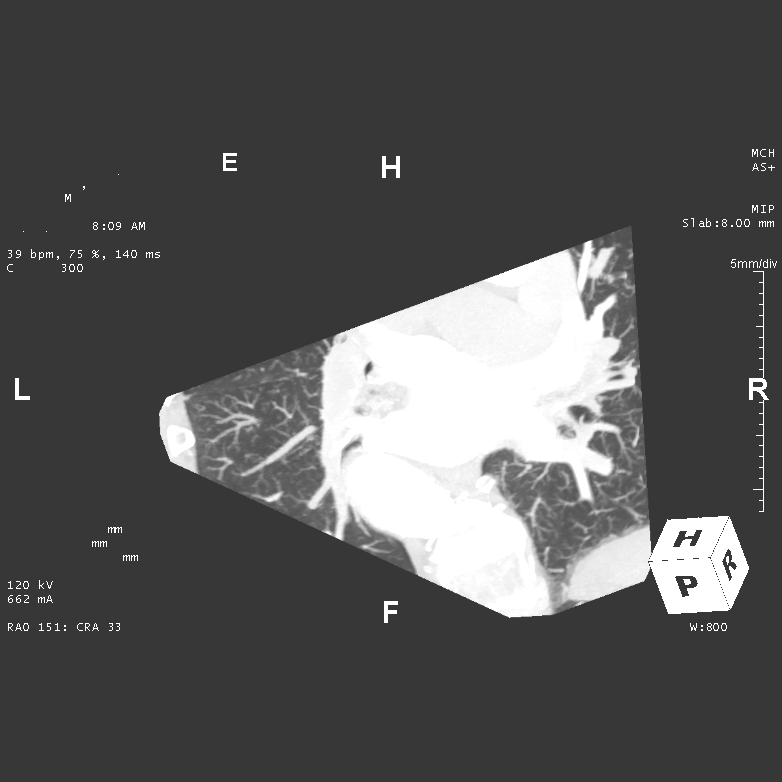
[im 2/2  vessel]
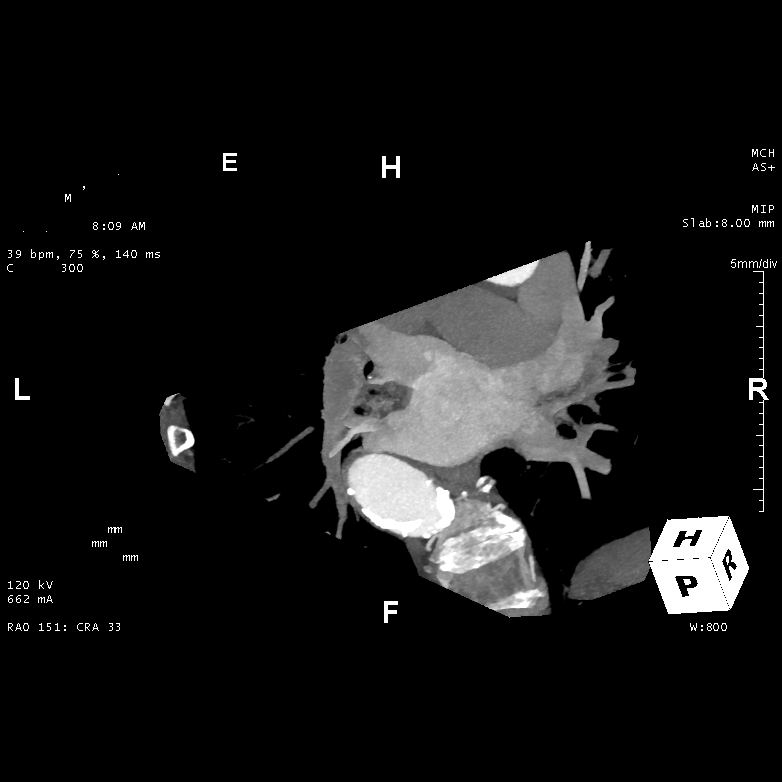

[2 of 2 positions shown; findings below may reference images not displayed]

FINDINGS: Aortic atherosclerosis. Multiple tiny 2-4 mm subpleural pulmonary
nodules are noted in the periphery of the right lung. Within the
visualized portions of the thorax there are no other larger more
suspicious appearing pulmonary nodules or masses, there is no acute
consolidative airspace disease, no pleural effusions, no
pneumothorax and no lymphadenopathy. Mild dilatation of the pulmonic
trunk, concerning for potential pulmonary arterial hypertension.
Visualized portions of the upper abdomen are unremarkable. There are
no aggressive appearing lytic or blastic lesions noted in the
visualized portions of the skeleton.
IMPRESSION: 1. Multiple tiny pulmonary nodules in the periphery of the right
lung, measuring 2-4 mm in size, strongly favored to represent benign
subpleural lymph nodes. No follow-up needed if patient is low-risk
(and has no known or suspected primary neoplasm). Non-contrast chest
CT can be considered in 12 months if patient is high-risk. This
recommendation follows the consensus statement: Guidelines for
Management of Incidental Pulmonary Nodules Detected on CT Images:
2.  Aortic Atherosclerosis (1N2I9-W8O.O).
FINDINGS: Image quality: Excellent

Noise artifact is: Limited

Pulmonary Veins: There is normal pulmonary vein drainage into the
left atrium (2 on the right and 2 on the left) with ostial
measurements as follows:

RUPV: Ostium 26 x 22 mm  area 4.11 cm2

RLPV:  Ostium 27 x 22 mm  area 4.24 cm2

There is a 5 mm accessory middle right pulmonary vein distal to the
ostium of RLPV.

LUPV:  Ostium 26 x 17 mm area 3.51 cm2

LLPV:  Ostium 26 x 23 mm  area 4.31 cm2

Left Atrium: The left atrial size is normal. There is no PFO/ASD.
The left atrial appendage is large wind sock type with ostial size
35 mm and length 28 mm. There is no thrombus in the left atrial
appendage on contrast or delayed imaging. The esophagus runs in the
left atrial midline in proximity to left lower pulmonary vein ostia.

Coronary Arteries: CAC score of 9885, which is 92 percentile for
age-, race-, and sex-matched controls. Normal coronary origin. Right
dominance. The study was performed without use of NTG and is
insufficient for plaque evaluation.

Right Atrium: Right atrial size is within normal limits.

Right Ventricle: The right ventricular cavity is within normal
limits.

Left Ventricle: The ventricular cavity size is within normal limits.

Pericardium: Normal thickness with no significant effusion or
calcium present.

Pulmonary Artery: Mildly dilated 34 mm without proximal filling
defect.

Cardiac valves: The aortic valve is trileaflet without significant
calcification. The mitral valve is normal structure without
significant calcification.

Aorta: Normal caliber with aortic annular atherosclerosis as well as
descending atherosclerosis.

Extra-cardiac findings: See attached radiology report for
non-cardiac structures.
IMPRESSION: 1. There is normal pulmonary vein drainage into the left atrium with
ostial measurements above.

2. There is no thrombus in the left atrial appendage.

3. The esophagus runs in the left atrial midline in proximity to
left lower pulmonary vein ostia.

4. No PFO/ASD.

5. Normal coronary origin. Right dominance.

6. CAC score of 9885 which is 92 percentile for age-, race-, and
sex-matched controls.

*** End of Addendum ***
EXAM:
OVER-READ INTERPRETATION  CT CHEST

The following report is an over-read performed by radiologist Dr.
Bambucafe Tarla [REDACTED] on 02/09/2021. This
over-read does not include interpretation of cardiac or coronary
anatomy or pathology. The coronary calcium score/coronary CTA
interpretation by the cardiologist is attached.
FINDINGS: Aortic atherosclerosis. Multiple tiny 2-4 mm subpleural pulmonary
nodules are noted in the periphery of the right lung. Within the
visualized portions of the thorax there are no other larger more
suspicious appearing pulmonary nodules or masses, there is no acute
consolidative airspace disease, no pleural effusions, no
pneumothorax and no lymphadenopathy. Mild dilatation of the pulmonic
trunk, concerning for potential pulmonary arterial hypertension.
Visualized portions of the upper abdomen are unremarkable. There are
no aggressive appearing lytic or blastic lesions noted in the
visualized portions of the skeleton.
IMPRESSION: 1. Multiple tiny pulmonary nodules in the periphery of the right
lung, measuring 2-4 mm in size, strongly favored to represent benign
subpleural lymph nodes. No follow-up needed if patient is low-risk
(and has no known or suspected primary neoplasm). Non-contrast chest
CT can be considered in 12 months if patient is high-risk. This
recommendation follows the consensus statement: Guidelines for
Management of Incidental Pulmonary Nodules Detected on CT Images:
2.  Aortic Atherosclerosis (1N2I9-W8O.O).

## 2022-03-04 NOTE — Addendum Note (Signed)
Encounter addended by: Annie Paras on: 03/04/2022 1:04 PM  Actions taken: Letter saved

## 2022-05-02 ENCOUNTER — Telehealth: Payer: Self-pay | Admitting: Cardiology

## 2022-05-02 NOTE — Telephone Encounter (Signed)
Would like office visit notes from visit on 12/26/21 faxed to their office. Please advise

## 2022-05-03 NOTE — Telephone Encounter (Signed)
Fax sent.

## 2022-05-29 ENCOUNTER — Ambulatory Visit: Payer: Medicare HMO | Admitting: Cardiology

## 2022-08-20 ENCOUNTER — Telehealth: Payer: Self-pay | Admitting: Cardiology

## 2022-08-20 NOTE — Telephone Encounter (Signed)
Patient is requesting call back to discuss appt that needs to be scheduled to have pacemaker implanted. He states that the [facility] [place] should have sent over a PA for this and he has been waiting for a phone call to schedule.

## 2022-09-18 ENCOUNTER — Ambulatory Visit: Payer: No Typology Code available for payment source | Attending: Cardiology | Admitting: Cardiology

## 2022-09-18 VITALS — BP 150/78 | HR 48 | Ht 70.0 in | Wt 186.0 lb

## 2022-09-18 DIAGNOSIS — I10 Essential (primary) hypertension: Secondary | ICD-10-CM | POA: Diagnosis not present

## 2022-09-18 DIAGNOSIS — Z79899 Other long term (current) drug therapy: Secondary | ICD-10-CM

## 2022-09-18 DIAGNOSIS — I4819 Other persistent atrial fibrillation: Secondary | ICD-10-CM | POA: Diagnosis not present

## 2022-09-18 NOTE — Patient Instructions (Signed)
Medication Instructions:  Your physician recommends that you continue on your current medications as directed. Please refer to the Current Medication list given to you today.  *If you need a refill on your cardiac medications before your next appointment, please call your pharmacy*  Follow-Up: At [facility], you and your health needs are our priority.  As part of our continuing mission to provide you with exceptional heart care, we have created designated Provider Care Teams.  These Care Teams include your primary Cardiologist (physician) and Advanced Practice Providers (APPs -  Physician Assistants and Nurse Practitioners) who all work together to provide you with the care you need, when you need it.   Your next appointment:   6 month(s)  The format for your next appointment:   In Person  Provider:   You will see one of the following Advanced Practice Providers on your designated Care Team:   Tommye Standard, [place] Legrand Como "Jonni Sanger" Chalmers Cater, [place]  Important Information About Sugar

## 2022-09-18 NOTE — Progress Notes (Signed)
Electrophysiology Office Follow up Visit Note:    Date:  09/18/2022   ID:  Edward Mcdaniel, DOB 23-Dec-1935, MRN [id number]  PCP:  [facility], [place] Cardiologist:  None  [facility] Electrophysiologist:  Vickie Epley, MD    Interval History:    Edward Mcdaniel is a 86 y.o. male who presents for a follow up visit. I last saw the patient December 23, 2021 for his atrial fibrillation.  He had a prior PVI on Feb 16, 2021.  He did have a recurrence of A-fib after the procedure and was ultimately loaded on Tikosyn.  He had a cardioversion December 27, 2021.  He takes Eliquis for stroke prophylaxis.  He follows with the [place].  He wore a heart monitor back in September which showed about a 7475% burden of atrial fibrillation.  He is completely unaware of his atrial fibrillation episodes.  Today he is in normal sinus rhythm.  He tells me that he is remaining quite active.  He plays golf.  He goes shopping.  He feels tired in the afternoons.  No syncope or presyncope.  No chest pain.  He takes his Eliquis without any bleeding issues.       Past Medical History:  Diagnosis Date   Allergic rhinitis    Asthma    DM type 2 (diabetes mellitus, type 2) ([place])    diet controlled   HTN (hypertension)    Hyperlipidemia    LV dysfunction    Persistent atrial fibrillation (HCC)    Squamous cell carcinoma of skin 10/02/2011   right arm - tx p bx   Squamous cell carcinoma of skin 01/03/2014   in situ on right cheek - tx p bx   Squamous cell carcinoma of skin 07/08/2017   in situ on left hand - tx p bx   Squamous cell carcinoma of skin 03/25/2018   well differentiated on top of left hand - tx p bx   Squamous cell carcinoma of skin 02/15/2020   well differentiated on left zygomatic area cx3, excision    Past Surgical History:  Procedure Laterality Date   ATRIAL FIBRILLATION ABLATION N/A 02/16/2021   Procedure: ATRIAL FIBRILLATION ABLATION;  Surgeon: Vickie Epley, MD;   Location: [facility];  Service: Cardiovascular;  Laterality: N/A;   CARDIOVERSION N/A 06/27/2021   Procedure: CARDIOVERSION;  Surgeon: [name], MD;  Location: [facility];  Service: Cardiovascular;  Laterality: N/A;   CARDIOVERSION N/A 12/27/2021   Procedure: CARDIOVERSION;  Surgeon: Minna Merritts, MD;  Location: [facility];  Service: Cardiovascular;  Laterality: N/A;   HERNIA REPAIR     x 2   KNEE SURGERY     bilateral   LEFT HEART CATH AND CORONARY ANGIOGRAPHY N/A 12/14/2020   Procedure: LEFT HEART CATH AND CORONARY ANGIOGRAPHY;  Surgeon: Leonie Man, MD;  Location: [facility];  Service: Cardiovascular;  Laterality: N/A;   RECTAL SURGERY     shoulder bone spur / tendon repair      Current Medications: Current Meds  Medication Sig   acetaminophen (TYLENOL) 500 MG tablet Take 1,000 mg by mouth every 6 (six) hours as needed for headache or mild pain.   albuterol (VENTOLIN HFA) 108 (90 Base) MCG/ACT inhaler Inhale 1-2 puffs into the lungs every 6 (six) hours as needed for wheezing or shortness of breath.   apixaban (ELIQUIS) 5 MG TABS tablet Take 5 mg by mouth 2 (two) times daily.   Carboxymethylcellulose  Sod PF 1 % GEL Place 1 drop into both eyes at bedtime.   dofetilide (TIKOSYN) 250 MCG capsule Take 1 capsule (250 mcg total) by mouth 2 (two) times daily.   donepezil (ARICEPT) 5 MG tablet Take 5 mg by mouth at bedtime.   furosemide (LASIX) 20 MG tablet Take 1 tablet (20 mg total) by mouth daily as needed for fluid or edema (Swelling).   lisinopril (ZESTRIL) 40 MG tablet Take 40 mg by mouth in the morning.   loratadine (CLARITIN) 10 MG tablet Take 10 mg by mouth daily as needed.   MENTHOL-METHYL SALICYLATE EX Apply 1 application topically 2 (two) times daily as needed (for joint pain.).   metFORMIN (GLUCOPHAGE-XR) 500 MG 24 hr tablet Take 500 mg by mouth in the morning.   mirtazapine (REMERON) 15 MG tablet Take 0.5 tablets by mouth at bedtime.    pantoprazole (PROTONIX) 40 MG tablet Take 1 tablet (40 mg total) by mouth daily.   pravastatin (PRAVACHOL) 40 MG tablet Take 40 mg by mouth at bedtime.   Propylene Glycol (SYSTANE BALANCE) 0.6 % SOLN Place 1 drop into both eyes 4 (four) times daily as needed (dry eyes).   tamsulosin (FLOMAX) 0.4 MG CAPS capsule Take 0.4 mg by mouth at bedtime.     Allergies:   Amlodipine and Tape   Social History   Socioeconomic History   Marital status: Widowed    Spouse name: Not on file   Number of children: 3   Years of education: Not on file   Highest education level: Not on file  Occupational History   Occupation: retired > [employer]    Occupation: Retired  Tobacco Use   Smoking status: Former    Packs/day: 1.50    Years: 30.00    Total pack years: 45.00    Types: Cigarettes    Quit date: 10/08/1987    Years since quitting: 34.9   Smokeless tobacco: Current    Types: Chew  Vaping Use   Vaping Use: Never used  Substance and Sexual Activity   Alcohol use: Yes    Comment: 2 shots a week whiskey   Drug use: Never   Sexual activity: Not Currently  Other Topics Concern   Not on file  Social History Narrative   veteran   Social Determinants of Health   Financial Resource Strain: Not on file  Food Insecurity: Not on file  Transportation Needs: Not on file  Physical Activity: Not on file  Stress: Not on file  Social Connections: Not on file     Family History: The patient's family history includes Heart disease in his brother and mother; Lung cancer in his mother.  ROS:   Please see the history of present illness.    All other systems reviewed and are negative.  EKGs/Labs/Other Studies Reviewed:    The following studies were reviewed today:  June 27, 2022 echo at the [facility] EF 40 to 45%, global hypokinesis Severely dilated left atrium Moderate pulmonary hypertension  [facility]  EKG:  The ekg ordered today demonstrates sinus rhythm.  QTc is 441 ms.  Recent  Labs: 12/26/2021: BNP 392.7; BUN 16; Creatinine, Ser 1.24; Hemoglobin 15.7; Magnesium 2.2; Platelets 148; Potassium 4.8; Sodium 139  Recent Lipid Panel    Component Value Date/Time   CHOL 103 12/13/2020 0248   TRIG 31 12/13/2020 0248   HDL 46 12/13/2020 0248   CHOLHDL 2.2 12/13/2020 0248   VLDL 6 12/13/2020 0248   LDLCALC 51 12/13/2020 0248  Physical Exam:    VS:  BP (!) 150/78   Pulse (!) 48   Ht '5\' 10"'$  (1.778 m)   Wt 186 lb (84.4 kg)   BMI 26.69 kg/m     Wt Readings from Last 3 Encounters:  09/18/22 186 lb (84.4 kg)  01/23/22 180 lb (81.6 kg)  12/26/21 183 lb (83 kg)     GEN:  Well nourished, well developed in no acute distress HEENT: Normal NECK: No JVD; No carotid bruits LYMPHATICS: No lymphadenopathy CARDIAC: RRR, no murmurs, rubs, gallops RESPIRATORY:  Clear to auscultation without rales, wheezing or rhonchi  ABDOMEN: Soft, non-tender, non-distended MUSCULOSKELETAL:  No edema; No deformity  SKIN: Warm and dry NEUROLOGIC:  Alert and oriented x 3 PSYCHIATRIC:  Normal affect        ASSESSMENT:    1. Persistent atrial fibrillation ([place])   2. Primary hypertension   3. Encounter for long-term (current) use of high-risk medication    PLAN:    In order of problems listed above:  #Persistent atrial fibrillation Post PVI in May 2022 and Tikosyn loading afterwards. He is having some atrial fibrillation.  His monitor in September showed a nearly 75% burden.  He is in normal rhythm today.  He is completely asymptomatic when atrial fibrillation.  Thankfully when he is in atrial fibrillation according to the monitor, his heart rates are well-controlled.  He tells me that his team at the [facility] has discussed repeat ablation versus pacemaker/AV junction ablation.  He had blood work checked about 4 weeks ago with the [facility] [place].  His creatinine was stable at that time.  We discussed treatment options for his atrial fibrillation including AV junction ablation with pacemaker  implant and repeat ablation and continued therapy with Tikosyn.  At this time, given his age and functional status, I would favor not changing the treatment strategy.  I would continue on Tikosyn and Eliquis.  I think the risk/benefit balance with catheter ablation and pacemaker/AV junction ablation are not favorable.  The patient is very agreeable to this plan.  I will plan to see him back every 6 months alternating with Fenton Malling at the [facility].  He will need an EKG at each visit to check his QT interval.  He needs to have blood work checked every 3 months.    #Hypertension Slightly above goal today.  Recommend checking blood pressures 1-2 times per week at home and recording the values.  Recommend bringing these recordings to the primary care physician.   Follow-up with an APP in 6 months with our office.   Medication Adjustments/Labs and Tests Ordered: Current medicines are reviewed at length with the patient today.  Concerns regarding medicines are outlined above.  No orders of the defined types were placed in this encounter.  No orders of the defined types were placed in this encounter.    Signed, Lars Mage, MD, [facility], [facility] 09/18/2022 8:55 AM    Electrophysiology [facility]

## 2022-10-21 ENCOUNTER — Ambulatory Visit: Payer: Medicare HMO | Admitting: Dermatology

## 2022-11-11 ENCOUNTER — Emergency Department: Payer: No Typology Code available for payment source

## 2022-11-11 ENCOUNTER — Other Ambulatory Visit: Payer: Self-pay

## 2022-11-11 ENCOUNTER — Inpatient Hospital Stay
Admission: EM | Admit: 2022-11-11 | Discharge: 2022-11-14 | DRG: 286 | Disposition: A | Discharge status: Home / Self Care | Payer: No Typology Code available for payment source | Attending: Family Medicine | Admitting: Family Medicine

## 2022-11-11 DIAGNOSIS — I509 Heart failure, unspecified: Secondary | ICD-10-CM | POA: Diagnosis not present

## 2022-11-11 DIAGNOSIS — I25110 Atherosclerotic heart disease of native coronary artery with unstable angina pectoris: Secondary | ICD-10-CM | POA: Diagnosis present

## 2022-11-11 DIAGNOSIS — C4402 Squamous cell carcinoma of skin of lip: Secondary | ICD-10-CM | POA: Diagnosis not present

## 2022-11-11 DIAGNOSIS — G4733 Obstructive sleep apnea (adult) (pediatric): Secondary | ICD-10-CM | POA: Diagnosis present

## 2022-11-11 DIAGNOSIS — E1165 Type 2 diabetes mellitus with hyperglycemia: Secondary | ICD-10-CM | POA: Diagnosis present

## 2022-11-11 DIAGNOSIS — Z801 Family history of malignant neoplasm of trachea, bronchus and lung: Secondary | ICD-10-CM | POA: Diagnosis not present

## 2022-11-11 DIAGNOSIS — E119 Type 2 diabetes mellitus without complications: Secondary | ICD-10-CM

## 2022-11-11 DIAGNOSIS — Z7901 Long term (current) use of anticoagulants: Secondary | ICD-10-CM | POA: Diagnosis not present

## 2022-11-11 DIAGNOSIS — Z91048 Other nonmedicinal substance allergy status: Secondary | ICD-10-CM | POA: Diagnosis not present

## 2022-11-11 DIAGNOSIS — I429 Cardiomyopathy, unspecified: Secondary | ICD-10-CM

## 2022-11-11 DIAGNOSIS — Z8249 Family history of ischemic heart disease and other diseases of the circulatory system: Secondary | ICD-10-CM

## 2022-11-11 DIAGNOSIS — I200 Unstable angina: Secondary | ICD-10-CM | POA: Diagnosis present

## 2022-11-11 DIAGNOSIS — E782 Mixed hyperlipidemia: Secondary | ICD-10-CM | POA: Diagnosis not present

## 2022-11-11 DIAGNOSIS — E785 Hyperlipidemia, unspecified: Secondary | ICD-10-CM | POA: Diagnosis present

## 2022-11-11 DIAGNOSIS — I4892 Unspecified atrial flutter: Secondary | ICD-10-CM | POA: Diagnosis present

## 2022-11-11 DIAGNOSIS — I440 Atrioventricular block, first degree: Secondary | ICD-10-CM | POA: Diagnosis present

## 2022-11-11 DIAGNOSIS — Z85828 Personal history of other malignant neoplasm of skin: Secondary | ICD-10-CM

## 2022-11-11 DIAGNOSIS — R7989 Other specified abnormal findings of blood chemistry: Secondary | ICD-10-CM | POA: Diagnosis not present

## 2022-11-11 DIAGNOSIS — I10 Essential (primary) hypertension: Secondary | ICD-10-CM | POA: Diagnosis not present

## 2022-11-11 DIAGNOSIS — I2510 Atherosclerotic heart disease of native coronary artery without angina pectoris: Secondary | ICD-10-CM | POA: Diagnosis not present

## 2022-11-11 DIAGNOSIS — I110 Hypertensive heart disease with heart failure: Secondary | ICD-10-CM | POA: Diagnosis present

## 2022-11-11 DIAGNOSIS — Z79899 Other long term (current) drug therapy: Secondary | ICD-10-CM | POA: Diagnosis not present

## 2022-11-11 DIAGNOSIS — I428 Other cardiomyopathies: Secondary | ICD-10-CM | POA: Diagnosis present

## 2022-11-11 DIAGNOSIS — Z720 Tobacco use: Secondary | ICD-10-CM | POA: Diagnosis not present

## 2022-11-11 DIAGNOSIS — D696 Thrombocytopenia, unspecified: Secondary | ICD-10-CM | POA: Diagnosis present

## 2022-11-11 DIAGNOSIS — I5023 Acute on chronic systolic (congestive) heart failure: Secondary | ICD-10-CM | POA: Diagnosis present

## 2022-11-11 DIAGNOSIS — J45909 Unspecified asthma, uncomplicated: Secondary | ICD-10-CM | POA: Diagnosis present

## 2022-11-11 DIAGNOSIS — R079 Chest pain, unspecified: Secondary | ICD-10-CM | POA: Diagnosis not present

## 2022-11-11 DIAGNOSIS — I483 Typical atrial flutter: Secondary | ICD-10-CM | POA: Diagnosis not present

## 2022-11-11 DIAGNOSIS — I4819 Other persistent atrial fibrillation: Secondary | ICD-10-CM | POA: Diagnosis not present

## 2022-11-11 DIAGNOSIS — Z7984 Long term (current) use of oral hypoglycemic drugs: Secondary | ICD-10-CM | POA: Diagnosis not present

## 2022-11-11 DIAGNOSIS — I4891 Unspecified atrial fibrillation: Secondary | ICD-10-CM | POA: Diagnosis present

## 2022-11-11 DIAGNOSIS — Z888 Allergy status to other drugs, medicaments and biological substances status: Secondary | ICD-10-CM

## 2022-11-11 DIAGNOSIS — R0609 Other forms of dyspnea: Secondary | ICD-10-CM | POA: Diagnosis not present

## 2022-11-11 LAB — CBC
HCT: 39.2 % (ref 39.0–52.0)
Hemoglobin: 13 g/dL (ref 13.0–17.0)
MCH: 32.7 pg (ref 26.0–34.0)
MCHC: 33.2 g/dL (ref 30.0–36.0)
MCV: 98.7 fL (ref 80.0–100.0)
Platelets: 162 10*3/uL (ref 150–400)
RBC: 3.97 MIL/uL — ABNORMAL LOW (ref 4.22–5.81)
RDW: 13.2 % (ref 11.5–15.5)
WBC: 7.5 10*3/uL (ref 4.0–10.5)
nRBC: 0 % (ref 0.0–0.2)

## 2022-11-11 LAB — TROPONIN I (HIGH SENSITIVITY)
Troponin I (High Sensitivity): 43 ng/L — ABNORMAL HIGH (ref <18)
Troponin I (High Sensitivity): 50 ng/L — ABNORMAL HIGH (ref <18)
Troponin I (High Sensitivity): 51 ng/L — ABNORMAL HIGH (ref <18)

## 2022-11-11 LAB — CBG MONITORING, ED: Glucose-Capillary: 192 mg/dL — ABNORMAL HIGH (ref 70–99)

## 2022-11-11 LAB — HEPARIN LEVEL (UNFRACTIONATED): Heparin Unfractionated: >1.1 IU/mL — ABNORMAL HIGH (ref 0.30–0.70)

## 2022-11-11 LAB — BASIC METABOLIC PANEL
Anion gap: 9 (ref 5–15)
BUN: 17 mg/dL (ref 8–23)
CO2: 25 mmol/L (ref 22–32)
Calcium: 8.8 mg/dL — ABNORMAL LOW (ref 8.9–10.3)
Chloride: 103 mmol/L (ref 98–111)
Creatinine, Ser: 1.06 mg/dL (ref 0.61–1.24)
GFR, Estimated: >60 mL/min (ref >60)
Glucose, Bld: 199 mg/dL — ABNORMAL HIGH (ref 70–99)
Potassium: 3.8 mmol/L (ref 3.5–5.1)
Sodium: 137 mmol/L (ref 135–145)

## 2022-11-11 LAB — APTT: aPTT: 36 seconds (ref 24–36)

## 2022-11-11 LAB — PROTIME-INR
INR: 1.4 — ABNORMAL HIGH (ref 0.8–1.2)
Prothrombin Time: 16.8 seconds — ABNORMAL HIGH (ref 11.4–15.2)

## 2022-11-11 LAB — BRAIN NATRIURETIC PEPTIDE: B Natriuretic Peptide: 1033.4 pg/mL — ABNORMAL HIGH (ref 0.0–100.0)

## 2022-11-11 MED ORDER — SENNOSIDES-DOCUSATE SODIUM 8.6-50 MG PO TABS: 1.0000 | ORAL_TABLET | Freq: Every evening | ORAL | Status: DC | PRN

## 2022-11-11 MED ORDER — ONDANSETRON HCL 4 MG PO TABS: 4.0000 mg | ORAL_TABLET | Freq: Four times a day (QID) | ORAL | Status: DC | PRN

## 2022-11-11 MED ORDER — FUROSEMIDE 10 MG/ML IJ SOLN
40.0000 mg | Freq: Once | INTRAMUSCULAR | Status: AC
  Administered 2022-11-11: 40 mg via INTRAVENOUS
  Filled 2022-11-11: qty 4

## 2022-11-11 MED ORDER — APIXABAN 5 MG PO TABS: 5.0000 mg | ORAL_TABLET | Freq: Two times a day (BID) | ORAL | Status: DC

## 2022-11-11 MED ORDER — INSULIN ASPART 100 UNIT/ML IJ SOLN
0.0000 [IU] | Freq: Three times a day (TID) | INTRAMUSCULAR | Status: DC
  Administered 2022-11-12 – 2022-11-13 (×3): 2 [IU] via SUBCUTANEOUS
  Filled 2022-11-11 (×3): qty 1

## 2022-11-11 MED ORDER — HEPARIN BOLUS VIA INFUSION
4000.0000 [IU] | Freq: Once | INTRAVENOUS | Status: AC
  Administered 2022-11-11: 4000 [IU] via INTRAVENOUS
  Filled 2022-11-11: qty 4000

## 2022-11-11 MED ORDER — INSULIN ASPART 100 UNIT/ML IJ SOLN
0.0000 [IU] | Freq: Every day | INTRAMUSCULAR | Status: DC
  Administered 2022-11-11: 0 [IU] via SUBCUTANEOUS

## 2022-11-11 MED ORDER — HEPARIN (PORCINE) 25000 UT/250ML-% IV SOLN
1350.0000 [IU]/h | INTRAVENOUS | Status: DC
  Administered 2022-11-11: 1000 [IU]/h via INTRAVENOUS
  Administered 2022-11-12: 1250 [IU]/h via INTRAVENOUS
  Administered 2022-11-13: 1350 [IU]/h via INTRAVENOUS
  Filled 2022-11-11 (×3): qty 250

## 2022-11-11 MED ORDER — ACETAMINOPHEN 650 MG RE SUPP: 650.0000 mg | Freq: Four times a day (QID) | RECTAL | Status: DC | PRN

## 2022-11-11 MED ORDER — NITROGLYCERIN 2 % TD OINT: 1.0000 [in_us] | TOPICAL_OINTMENT | Freq: Four times a day (QID) | TRANSDERMAL | Status: DC | PRN

## 2022-11-11 MED ORDER — PRAVASTATIN SODIUM 40 MG PO TABS
40.0000 mg | ORAL_TABLET | Freq: Every day | ORAL | Status: DC
  Administered 2022-11-11 – 2022-11-13 (×3): 40 mg via ORAL
  Filled 2022-11-11 (×2): qty 1
  Filled 2022-11-11: qty 2

## 2022-11-11 MED ORDER — ONDANSETRON HCL 4 MG/2ML IJ SOLN: 4.0000 mg | Freq: Four times a day (QID) | INTRAMUSCULAR | Status: DC | PRN

## 2022-11-11 MED ORDER — WHITE PETROLATUM EX OINT
TOPICAL_OINTMENT | CUTANEOUS | Status: DC | PRN
  Filled 2022-11-11: qty 5

## 2022-11-11 MED ORDER — ACETAMINOPHEN 325 MG PO TABS: 650.0000 mg | ORAL_TABLET | Freq: Four times a day (QID) | ORAL | Status: DC | PRN

## 2022-11-11 MED ORDER — FUROSEMIDE 10 MG/ML IJ SOLN
40.0000 mg | Freq: Two times a day (BID) | INTRAMUSCULAR | Status: DC
  Administered 2022-11-12: 40 mg via INTRAVENOUS
  Filled 2022-11-11: qty 4

## 2022-11-11 MED ORDER — LABETALOL HCL 5 MG/ML IV SOLN: 5.0000 mg | INTRAVENOUS | Status: DC | PRN

## 2022-11-11 NOTE — H&P (Signed)
History and Physical   Edward Mcdaniel ZDG:[id number] DOB: 12-Apr-1936 DOA: 11/11/2022  PCP: [facility]  Outpatient Specialists: Dr. Lars Mage, cardiology Patient coming from: Home  I have personally briefly reviewed patient's old medical records in [place].  Chief Concern: Chest pain and shortness of breath  HPI: Edward Mcdaniel is a 87 year old male with history of CAD, hypertension, atrial fibrillation status post pulmonary vein intervention, non-insulin-dependent diabetes mellitus, hyperlipidemia, who presents emergency department for chief concerns of chest pain over the weekend.  Initial vitals in the ED showed temperature of 98.1, respiration rate of 18, heart rate of 50, blood pressure 123/66, SpO2 of 97% on room air.  Serum sodium is 137, potassium 3.8, chloride 103, bicarb 25, BUN is 17, serum creatinine of 1.06, nonfasting blood glucose 199, eGFR greater than 60, WBC 7.5, hemoglobin 13, platelets of 162.  BNP was elevated at 1033.4.  High sensitive troponin was 43.  ED treatment: Furosemide 40 mg IV one-time dose. ---------------------------- At bedside, he is able to tell me his name, his age, the current calendar year and the current location.  His daughter, Edward Mcdaniel was at bedside with patient's permission.  He reports that on Friday he developed dull chest pain that was unremitting for 48 hours.  He does not know what he was doing when it happened.  He denies shortness of breath, arm discomfort, jaw discomfort.  His daughter at bedside states that, "you must have felt short of breath if he thought initially that this was asthma. "  Patient persisted and denies shortness of breath.  He endorses swelling of his lower extremity but did not notice much.  He denies known weight gain or tightening of his clothes.  He reports no energy over the last weekend.  Social history: He currently chews tobacco.  He infrequently uses EtOH and denies recreational  drug use.  He is retired and formally was in [employer] and worked for [employer].  ROS: Constitutional: no weight change, no fever ENT/Mouth: no sore throat, no rhinorrhea Eyes: no eye pain, no vision changes Cardiovascular: + chest pain, no dyspnea,  + edema, no palpitations Respiratory: no cough, no sputum, no wheezing Gastrointestinal: no nausea, no vomiting, no diarrhea, no constipation Genitourinary: no urinary incontinence, no dysuria, no hematuria Musculoskeletal: no arthralgias, no myalgias Skin: no skin lesions, no pruritus, lower lip lesion Neuro: + weakness, no loss of consciousness, no syncope Psych: no anxiety, no depression, no decrease appetite Heme/Lymph: no bruising, no bleeding  ED Course: Discussed with emergency medicine provider, patient requiring hospitalization for chief concerns of new onset heart failure.  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   Diabetes mellitus type 2 in nonobese [facility])   Essential hypertension   Atrial fibrillation with slow ventricular response (HCC)   Cardiomyopathy ([place])   Persistent atrial fibrillation (HCC)   Hyperlipidemia   Assessment and Plan:  * New onset of congestive heart failure ([place]) - Suspect reduced ejection fraction given decreased ejection fraction noted on bedside ultrasound per EDP - Status post furosemide 40 mg IV per EDP - Ordered furosemide 40 mg IV twice daily, 2 doses ordered for 11/12/2022 - Strict I's and O's - Complete echo ordered - Staff message and Epic order placed for Dr. 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 twice daily  Cardiomyopathy ([place]) - Complete echo ordered  Essential hypertension - Labetalol 5 mg IV every 3 hours as needed for SBP greater than 175, 4 doses ordered  Diabetes mellitus type 2 in nonobese (HCC) Non-insulin-dependent diabetes mellitus - Metformin 500 mg in the morning not resumed on admission - Insulin SSI with at bedtime coverage ordered - Goal inpatient blood glucose levels 140-180  Chart reviewed.   DVT prophylaxis: Heparin GTT Code Status: Full code, discussed with patient with daughter at bedside Diet: Heart failure/carb modified Family Communication: Daughter, Transport planner with patient's permission Disposition Plan: Pending clinical course Consults called: Cardiology Admission status: Telemetry cardiac, inpatient  Past Medical History:  Diagnosis Date   Allergic rhinitis    Asthma    DM type 2 (diabetes mellitus, type 2) ([place])    diet controlled   HTN (hypertension)    Hyperlipidemia    LV dysfunction    Persistent atrial fibrillation (HCC)    Squamous cell carcinoma of skin 10/02/2011   right arm - tx p bx   Squamous cell carcinoma of skin 01/03/2014   in situ on right cheek - tx p bx   Squamous cell carcinoma of skin 07/08/2017   in situ on left hand - tx p bx   Squamous cell carcinoma of skin 03/25/2018   well differentiated on top of left hand - tx p bx    Squamous cell carcinoma of skin 02/15/2020   well differentiated on left zygomatic area cx3, excision   Past Surgical History:  Procedure Laterality Date   ATRIAL FIBRILLATION ABLATION N/A 02/16/2021   Procedure: ATRIAL FIBRILLATION ABLATION;  Surgeon: Vickie Epley, MD;  Location: [place] [facility];  Service: Cardiovascular;  Laterality: N/A;   CARDIOVERSION N/A 06/27/2021   Procedure: CARDIOVERSION;  Surgeon: [name], MD;  Location: [place];  Service: Cardiovascular;  Laterality: N/A;   CARDIOVERSION N/A 12/27/2021   Procedure: CARDIOVERSION;  Surgeon: Minna Merritts, MD;  Location: [facility];  Service: Cardiovascular;  Laterality: N/A;   HERNIA REPAIR     x 2   KNEE SURGERY     bilateral   LEFT HEART CATH AND CORONARY ANGIOGRAPHY N/A 12/14/2020   Procedure: LEFT HEART CATH AND CORONARY ANGIOGRAPHY;  Surgeon: Leonie Man, MD;  Location: [place] [facility];  Service: Cardiovascular;  Laterality: N/A;   RECTAL SURGERY     shoulder bone spur / tendon repair     Social History:  reports that he quit smoking about 35 years ago. His smoking use included cigarettes. He has a 45.00 pack-year smoking history. His smokeless tobacco use includes chew. He reports current alcohol use. He reports that he does not use drugs.  Allergies  Allergen Reactions   Amlodipine Other (See Comments)  Nightmares   Tape Rash    Eruption, Erythema   Family History  Problem Relation Age of Onset   Lung cancer Mother        smoker   Heart disease Mother        bypass, further details unclear   Heart disease Brother        bypass, further details unclear   Family history: Family history reviewed and not pertinent.  Prior to Admission medications   Medication Sig Start Date End Date Taking? Authorizing Provider  acetaminophen (TYLENOL) 500 MG tablet Take 1,000 mg by mouth every 6 (six) hours as needed for headache or mild pain.    [provider]  albuterol (VENTOLIN  HFA) 108 (90 Base) MCG/ACT inhaler Inhale 1-2 puffs into the lungs every 6 (six) hours as needed for wheezing or shortness of breath.    [provider]  apixaban (ELIQUIS) 5 MG TABS tablet Take 5 mg by mouth 2 (two) times daily.    [provider]  Carboxymethylcellulose Sod PF 1 % GEL Place 1 drop into both eyes at bedtime.    [provider]  dofetilide (TIKOSYN) 250 MCG capsule Take 1 capsule (250 mcg total) by mouth 2 (two) times daily. 06/28/21   Baldwin Jamaica, PA-C  donepezil (ARICEPT) 5 MG tablet Take 5 mg by mouth at bedtime. 06/21/21   [provider]  furosemide (LASIX) 20 MG tablet Take 1 tablet (20 mg total) by mouth daily as needed for fluid or edema (Swelling). 01/23/22   Vickie Epley, MD  lisinopril (ZESTRIL) 40 MG tablet Take 40 mg by mouth in the morning.    [provider]  loratadine (CLARITIN) 10 MG tablet Take 10 mg by mouth daily as needed.    [provider]  MENTHOL-METHYL SALICYLATE EX Apply 1 application topically 2 (two) times daily as needed (for joint pain.).    [provider]  metFORMIN (GLUCOPHAGE-XR) 500 MG 24 hr tablet Take 500 mg by mouth in the morning.    [provider]  mirtazapine (REMERON) 15 MG tablet Take 0.5 tablets by mouth at bedtime. 04/25/21   [provider]  pantoprazole (PROTONIX) 40 MG tablet Take 1 tablet (40 mg total) by mouth daily. 02/18/21   Almyra Deforest, PA  pravastatin (PRAVACHOL) 40 MG tablet Take 40 mg by mouth at bedtime.    [provider]  Propylene Glycol (SYSTANE BALANCE) 0.6 % SOLN Place 1 drop into both eyes 4 (four) times daily as needed (dry eyes).    [provider]  tamsulosin (FLOMAX) 0.4 MG CAPS capsule Take 0.4 mg by mouth at bedtime.    [provider]   Physical Exam: Vitals:   11/11/22 1232 11/11/22 1630  BP: 123/66 139/75  Pulse: (!) 50 (!) 47  Resp: 18 (!) 22  Temp: 98.1 F (36.7 C) 98 F (36.7 C)   TempSrc: Oral   SpO2: 97% 98%   Constitutional: appears age appropriate, NAD, calm, comfortable Eyes: PERRL, lids and conjunctivae normal ENMT: Mucous membranes are moist. Posterior pharynx clear of any exudate or lesions. Age-appropriate dentition. Hearing appropriate. Lower lip has a ulcer like wound Neck: normal, supple, no masses, no thyromegaly Respiratory: clear to auscultation bilaterally, no wheezing, no crackles. Normal respiratory effort. No accessory muscle use.  Cardiovascular: Regular rate and rhythm, no murmurs / rubs / gallops. No extremity edema. 2+ pedal pulses. No carotid bruits.  Abdomen: no tenderness, no masses palpated, no hepatosplenomegaly. Bowel sounds  positive.  Musculoskeletal: no clubbing / cyanosis. No joint deformity upper and lower extremities. Good ROM, no contractures, no atrophy. Normal muscle tone.  Skin: no rashes, lesions, ulcers. No induration Neurologic: Sensation intact. Strength 5/5 in all 4.  Psychiatric: Normal judgment and insight. Alert and oriented x 3. Normal mood.   EKG: independently reviewed, showing atrial fibrillation with rate of 51, QTc 431  Chest x-ray on Admission: I personally reviewed and I agree with radiologist reading as below.  DG Chest 2 View  Result Date: 11/11/2022 CLINICAL DATA:  chf EXAM: CHEST - 2 VIEW COMPARISON:  December 12, 2020. FINDINGS: The cardiomediastinal silhouette is unchanged and enlarged in contour.Atherosclerotic calcifications of the tortuous thoracic aorta. Vascular congestion with cephalization of the vasculature. No pleural effusion. No pneumothorax. Visualized abdomen is unremarkable. Mild degenerative changes of the thoracic spine. IMPRESSION: Constellation of findings most consistent with mild pulmonary edema. Electronically Signed   By: Valentino Saxon M.D.   On: 11/11/2022 13:45    Labs on Admission: I have personally reviewed following labs  CBC: Recent Labs  Lab 11/11/22 1235  WBC 7.5  HGB 13.0   HCT 39.2  MCV 98.7  PLT 097   Basic Metabolic Panel: Recent Labs  Lab 11/11/22 1235  NA 137  K 3.8  CL 103  CO2 25  GLUCOSE 199*  BUN 17  CREATININE 1.06  CALCIUM 8.8*   GFR: CrCl cannot be calculated (Unknown ideal weight.).  Urine analysis:    Component Value Date/Time   COLORURINE YELLOW 10/09/2010 1245   APPEARANCEUR CLOUDY (A) 10/09/2010 1245   LABSPEC 1.010 10/09/2010 1245   PHURINE 7.0 10/09/2010 1245   GLUCOSEU NEGATIVE 10/09/2010 1245   HGBUR NEGATIVE 10/09/2010 1245   BILIRUBINUR NEGATIVE 10/09/2010 1245   KETONESUR NEGATIVE 10/09/2010 1245   PROTEINUR NEGATIVE 10/09/2010 1245   UROBILINOGEN 0.2 10/09/2010 1245   NITRITE NEGATIVE 10/09/2010 1245   LEUKOCYTESUR  10/09/2010 1245    NEGATIVE MICROSCOPIC NOT DONE ON URINES WITH NEGATIVE PROTEIN, BLOOD, LEUKOCYTES, NITRITE, OR GLUCOSE <1000 mg/dL.   CRITICAL CARE Performed by: Dr. Tobie Poet  Total critical care time: 35 minutes  Critical care time was exclusive of separately billable procedures and treating other patients.  Critical care was necessary to treat or prevent imminent or life-threatening deterioration.  Critical care was time spent personally by me on the following activities: development of treatment plan with patient and/or surrogate as well as nursing, discussions with consultants, evaluation of patient's response to treatment, examination of patient, obtaining history from patient or surrogate, ordering and performing treatments and interventions, ordering and review of laboratory studies, ordering and review of radiographic studies, pulse oximetry and re-evaluation of patient's condition.  This document was prepared using Dragon Voice Recognition software and may include unintentional dictation errors.  Dr. Tobie Poet [facility]  If 7PM-7AM, please contact overnight-coverage provider If 7AM-7PM, please contact day coverage provider [email or web address]  11/11/2022, 9:31 PM

## 2022-11-11 NOTE — Assessment & Plan Note (Addendum)
Patient presented with chest pain and shortness of breath.  Elevated BNP and echocardiogram with low EF and global hypokinesis.  Cardiology was consulted and he will be going for right and left cardiac catheterization today. -Continue with IV Lasix - Strict I's and O's -Daily BMP and weight

## 2022-11-11 NOTE — ED Notes (Signed)
Pt transferred over to a hospital bed at this time. Dtr at bedside. Call light within reach. Pt has no further needs at this time.

## 2022-11-11 NOTE — Assessment & Plan Note (Signed)
-  Continue home Tikosyn -Holding Eliquis as patient is currently on heparin infusion, most likely with starting after cardiac catheterization

## 2022-11-11 NOTE — Assessment & Plan Note (Signed)
-   Complete echo ordered

## 2022-11-11 NOTE — Assessment & Plan Note (Signed)
Patient was on lisinopril at home, cardiology switched it to losartan. Cardiology is planning to add spironolactone from tomorrow - Labetalol 5 mg IV every 3 hours as needed for SBP greater than 175,

## 2022-11-11 NOTE — ED Triage Notes (Signed)
Pt states his PCP sent him for new CHF . Pt states that he had CP over the weekend, but denies today. Pt states that he has had weakness and SHOB as well. No pain at this time.

## 2022-11-11 NOTE — Hospital Course (Signed)
Edward Mcdaniel is a 87 year old male with history of CAD, hypertension, atrial fibrillation status post pulmonary vein intervention, non-insulin-dependent diabetes mellitus, hyperlipidemia, who presents emergency department for chief concerns of chest pain over the weekend.  Initial vitals in the ED showed temperature of 98.1, respiration rate of 18, heart rate of 50, blood pressure 123/66, SpO2 of 97% on room air.  Serum sodium is 137, potassium 3.8, chloride 103, bicarb 25, BUN is 17, serum creatinine of 1.06, nonfasting blood glucose 199, eGFR greater than 60, WBC 7.5, hemoglobin 13, platelets of 162.  BNP was elevated at 1033.4.  High sensitive troponin was 43.  ED treatment: Furosemide 40 mg IV one-time dose.

## 2022-11-11 NOTE — Assessment & Plan Note (Signed)
-   Pravastatin 40 mg nightly resumed

## 2022-11-11 NOTE — Assessment & Plan Note (Signed)
Non-insulin-dependent diabetes mellitus - Metformin 500 mg in the morning not resumed on admission - Insulin SSI with at bedtime coverage ordered - Goal inpatient blood glucose levels 140-180

## 2022-11-11 NOTE — Assessment & Plan Note (Addendum)
-   Per patient is status post biopsy - Counseled patient on appropriate follow-up outpatient - Counseled patient on cessation of tobacco chewing - Petroleum gel as needed

## 2022-11-11 NOTE — Assessment & Plan Note (Addendum)
Mildly positive troponin with a flat curve, most likely secondary to demand ischemia. Patient is currently on heparin infusion. Going for right and left cardiac catheterization for acute HFrEF Cardiology is on board

## 2022-11-11 NOTE — ED Provider Notes (Signed)
[facility] Provider Note    Event Date/Time   First MD Initiated Contact with Patient 11/11/22 1533     (approximate)   History   Shortness of Breath and Weakness   HPI  Edward Mcdaniel is a 87 y.o. male past medical history of diabetes, atrial fibrillation status post PVI on Eliquis and dofetilide, hypertension hyperlipidemia who presents with shortness of breath.  Patient tells me that he is here because he is feeling fatigued.  Has been feeling fatigued for about 6 months.  Along with this he has had dyspnea on exertion.  Says he plays golf and oftentimes he has to stop because he gets short of breath.  Does feel worse over the last week or so.  Denies orthopnea or PND.  Denies any notable lower extremity swelling or known weight gain.  He is compliant with his medications.  Says he does take Lasix but has never been diagnosed with CHF before.  Tells me he has not discussed this with his cardiologist because he thought it was a normal part of having A-fib.  When asked specifically about chest pain he says that over the weekend he was having chest pain specifically on Saturday lasted for most of the day it was a tight feeling nonexertional nonpleuritic.  He is not having any pain today just had some mild discomfort yesterday but this is now resolved.  Denies any significant history of chest pain.  Tells me he went to the [facility] urgent care clinic today and did a chest x-ray that was concerning for some mild pulmonary vascular congestion he was told to come to the emergency department.   Reviewed last echo in our system from 01/30/2021 showed normal EF unable to evaluate the diastolic function.  Past Medical History:  Diagnosis Date   Allergic rhinitis    Asthma    DM type 2 (diabetes mellitus, type 2) ([place])    diet controlled   HTN (hypertension)    Hyperlipidemia    LV dysfunction    Persistent atrial fibrillation (HCC)    Squamous cell carcinoma of  skin 10/02/2011   right arm - tx p bx   Squamous cell carcinoma of skin 01/03/2014   in situ on right cheek - tx p bx   Squamous cell carcinoma of skin 07/08/2017   in situ on left hand - tx p bx   Squamous cell carcinoma of skin 03/25/2018   well differentiated on top of left hand - tx p bx   Squamous cell carcinoma of skin 02/15/2020   well differentiated on left zygomatic area cx3, excision    Patient Active Problem List   Diagnosis Date Noted   Secondary hypercoagulable state ([place]) 06/25/2021   Persistent atrial fibrillation ([place]) 06/25/2021   Atrial fibrillation ([place]) 02/16/2021   Atrial fibrillation with slow ventricular response ([place]) 12/12/2020   Cardiomyopathy ([place]) 12/12/2020   Precordial chest pain 12/12/2020   Chest pain 12/12/2020   Angina at rest    OSA (obstructive sleep apnea)    HYPERLIPIDEMIA 02/16/2009   Diabetes mellitus type 2 in nonobese ([place]) 11/03/2007   HYPERCHOLESTEROLEMIA 11/03/2007   Essential hypertension 11/03/2007   Seasonal and perennial allergic rhinitis 11/03/2007   Asthma, mild intermittent 11/03/2007     Physical Exam  Triage Vital Signs: ED Triage Vitals  Enc Vitals Group     BP 11/11/22 1232 123/66     Pulse Rate 11/11/22 1232 (!) 50     Resp 11/11/22  1232 18     Temp 11/11/22 1232 98.1 F (36.7 C)     Temp Source 11/11/22 1232 Oral     SpO2 11/11/22 1232 97 %     Weight --      Height --      Head Circumference --      Peak Flow --      Pain Score 11/11/22 1230 0     Pain Loc --      Pain Edu? --      Excl. in [place]? --     Most recent vital signs: Vitals:   11/11/22 1232 11/11/22 1630  BP: 123/66 139/75  Pulse: (!) 50 (!) 47  Resp: 18 (!) 22  Temp: 98.1 F (36.7 C) 98 F (36.7 C)  SpO2: 97% 98%     General: Awake, no distress.  CV:  Good peripheral perfusion. 1+ pitting edema in the bilateral lower extremities Resp:  Normal effort.  Lung sounds are clear no increased work of breathing Abd:  No distention.   Neuro:             Awake, Alert, Oriented x 3  Other:     ED Results / Procedures / Treatments  Labs (all labs ordered are listed, but only abnormal results are displayed) Labs Reviewed  BASIC METABOLIC PANEL - Abnormal; Notable for the following components:      Result Value   Glucose, Bld 199 (*)    Calcium 8.8 (*)    All other components within normal limits  CBC - Abnormal; Notable for the following components:   RBC 3.97 (*)    All other components within normal limits  BRAIN NATRIURETIC PEPTIDE - Abnormal; Notable for the following components:   B Natriuretic Peptide 1,033.4 (*)    All other components within normal limits  TROPONIN I (HIGH SENSITIVITY) - Abnormal; Notable for the following components:   Troponin I (High Sensitivity) 43 (*)    All other components within normal limits     EKG  EKG reviewed and interpreted by myself shows sinus bradycardia with widened QRS, LVH, PVCs   RADIOLOGY I reviewed and interpreted the chest x-ray which shows mild pulmonary vascular congestion  I performed a bedside ultrasound of the heart which does show diminished EF estimated about 30%  PROCEDURES:  Critical Care performed: No  Procedures  The patient is on the cardiac monitor to evaluate for evidence of arrhythmia and/or significant heart rate changes.   MEDICATIONS ORDERED IN ED: Medications  furosemide (LASIX) injection 40 mg (40 mg Intravenous Given 11/11/22 1625)     IMPRESSION / MDM / ASSESSMENT AND PLAN / ED COURSE  I reviewed the triage vital signs and the nursing notes.                              Patient's presentation is most consistent with acute presentation with potential threat to life or bodily function.  Differential diagnosis includes, but is not limited to, CHF exacerbation, arrhythmia induced CHF, ACS, ischemic cardiomyopathy, valvular disease  The patient is an 87 year old male who presents with subacute and dyspnea on exertion.  He went  to [facility] today and they referred him to the ED due to signs of CHF.  Patient has history of A-fib has had PVI but cardiac monitoring has showed that he is most of the time in A-fib.  He is on dofetilide.  Follows with Dr.  [name].  Last echo from 2 years ago showed normal EF, no significant valvular disease.  He endorses dyspnea on exertion for multiple months but did have chest pain over the weekend specifically on Saturday describes a tight feeling in the chest that lasted for most of the day but he did not seek care for this and has not had pain yesterday or today.  He is in sinus bradycardia QTc is normal.  No obvious ischemic changes on EKG.  Blood pressure is okay he satting 99% on room air my evaluation.  Does have peripheral edema he was not aware of this but lungs sound clear he is not in any respiratory distress.  Chest x-ray shows mild pulmonary vascular congestion.  A CBC and BMP were drawn from triage these are largely reassuring.  Electrolytes are okay.  Will add on a BNP and troponin.  I do think it is likely that patient has developed CHF and he will need follow-up however if troponin is negative and patient is not hypoxic I do think that he can likely follow-up as an outpatient.  Will give IV Lasix.   I performed a bedside echo and the EF certainly does look diminished.  Estimate about 30%.  LV looks dilated.  This is quite different from his echo report from 2022.  Will see what patient's troponin looks like but this does point me to the fact that patient may need admission to be optimized.  Patient's troponin is 43.  BNP greater than 1000.  Given the echo findings and patient's symptoms I do think that admission for IV diuresis, cardiology consult and further medical management of his heart failure is warranted.  Discussed this with patient he is willing to stay for admission.  FINAL CLINICAL IMPRESSION(S) / ED DIAGNOSES   Final diagnoses:  Congestive heart failure,  unspecified HF chronicity, unspecified heart failure type ([place])     Rx / DC Orders   ED Discharge Orders     None        Note:  This document was prepared using Dragon voice recognition software and may include unintentional dictation errors.   Rada Hay, MD 11/11/22 [phone]

## 2022-11-11 NOTE — Consult Note (Signed)
[place] for IV Heparin Indication: chest pain/ACS and atrial fibrillation  Patient Measurements: Height: '5\' 10"'$  (177.8 cm) Weight: 83.9 kg (185 lb) IBW/kg (Calculated) : 73 Heparin Dosing Weight: 83.9 kg  Labs: Recent Labs    11/11/22 1235 11/11/22 1810  HGB 13.0  --   HCT 39.2  --   PLT 162  --   CREATININE 1.06  --   TROPONINIHS 43* 50*    CrCl cannot be calculated (Unknown ideal weight.).   Medical History: Past Medical History:  Diagnosis Date   Allergic rhinitis    Asthma    DM type 2 (diabetes mellitus, type 2) (HCC)    diet controlled   HTN (hypertension)    Hyperlipidemia    LV dysfunction    Persistent atrial fibrillation (HCC)    Squamous cell carcinoma of skin 10/02/2011   right arm - tx p bx   Squamous cell carcinoma of skin 01/03/2014   in situ on right cheek - tx p bx   Squamous cell carcinoma of skin 07/08/2017   in situ on left hand - tx p bx   Squamous cell carcinoma of skin 03/25/2018   well differentiated on top of left hand - tx p bx   Squamous cell carcinoma of skin 02/15/2020   well differentiated on left zygomatic area cx3, excision    Medications:  Apixaban 5 mg BID prior to admission, last patient reported dose was 11/11/22 AM  Assessment: 88 y/o M with medical history including Afib on apixaban admitted with new-onset CHF and elevated troponin. Pharmacy consulted to initiate and manage heparin infusion for ACS. Home apixaban for Afib on hold pending further evaluation.   Baseline aPTT, PT-INR, and heparin level are pending. Baseline CBC within normal limits.  Goal of Therapy:  Heparin level 0.3-0.7 units/ml aPTT 66 - 102 seconds Monitor platelets by anticoagulation protocol: Yes   Plan:  --Heparin 4000 unit IV bolus followed by continuous infusion at 1000 units/hr --aPTT 8 hours from initiation of infusion. Re-check heparin level tomorrow AM. Switch over to heparin level monitoring when  correlation established given anticipation of interference of apixaban on heparin level assay --Daily CBC per protocol while on IV heparin  Benita Gutter 11/11/2022,9:42 PM

## 2022-11-12 ENCOUNTER — Other Ambulatory Visit: Payer: Self-pay

## 2022-11-12 ENCOUNTER — Inpatient Hospital Stay (HOSPITAL_COMMUNITY)
Admit: 2022-11-12 | Discharge: 2022-11-12 | Disposition: A | Discharge status: Home / Self Care | Payer: No Typology Code available for payment source | Attending: Internal Medicine | Admitting: Internal Medicine

## 2022-11-12 DIAGNOSIS — R0609 Other forms of dyspnea: Secondary | ICD-10-CM

## 2022-11-12 DIAGNOSIS — I4819 Other persistent atrial fibrillation: Secondary | ICD-10-CM

## 2022-11-12 DIAGNOSIS — C4402 Squamous cell carcinoma of skin of lip: Secondary | ICD-10-CM | POA: Diagnosis not present

## 2022-11-12 DIAGNOSIS — I5023 Acute on chronic systolic (congestive) heart failure: Secondary | ICD-10-CM

## 2022-11-12 DIAGNOSIS — R7989 Other specified abnormal findings of blood chemistry: Secondary | ICD-10-CM

## 2022-11-12 DIAGNOSIS — I509 Heart failure, unspecified: Secondary | ICD-10-CM | POA: Diagnosis not present

## 2022-11-12 DIAGNOSIS — R079 Chest pain, unspecified: Secondary | ICD-10-CM

## 2022-11-12 DIAGNOSIS — I10 Essential (primary) hypertension: Secondary | ICD-10-CM

## 2022-11-12 LAB — BASIC METABOLIC PANEL
Anion gap: 10 (ref 5–15)
BUN: 20 mg/dL (ref 8–23)
CO2: 24 mmol/L (ref 22–32)
Calcium: 8.6 mg/dL — ABNORMAL LOW (ref 8.9–10.3)
Chloride: 103 mmol/L (ref 98–111)
Creatinine, Ser: 0.99 mg/dL (ref 0.61–1.24)
GFR, Estimated: >60 mL/min (ref >60)
Glucose, Bld: 137 mg/dL — ABNORMAL HIGH (ref 70–99)
Potassium: 4 mmol/L (ref 3.5–5.1)
Sodium: 137 mmol/L (ref 135–145)

## 2022-11-12 LAB — CBC
HCT: 36.9 % — ABNORMAL LOW (ref 39.0–52.0)
Hemoglobin: 12.2 g/dL — ABNORMAL LOW (ref 13.0–17.0)
MCH: 32.2 pg (ref 26.0–34.0)
MCHC: 33.1 g/dL (ref 30.0–36.0)
MCV: 97.4 fL (ref 80.0–100.0)
Platelets: 149 10*3/uL — ABNORMAL LOW (ref 150–400)
RBC: 3.79 MIL/uL — ABNORMAL LOW (ref 4.22–5.81)
RDW: 13.2 % (ref 11.5–15.5)
WBC: 6.4 10*3/uL (ref 4.0–10.5)
nRBC: 0 % (ref 0.0–0.2)

## 2022-11-12 LAB — GLUCOSE, CAPILLARY: Glucose-Capillary: 133 mg/dL — ABNORMAL HIGH (ref 70–99)

## 2022-11-12 LAB — ECHOCARDIOGRAM COMPLETE
AR max vel: 2.26 cm2
AV Area VTI: 2.62 cm2
AV Area mean vel: 2.34 cm2
AV Mean grad: 3 mmHg
AV Peak grad: 5.2 mmHg
Ao pk vel: 1.14 m/s
Area-P 1/2: 3.61 cm2
Calc EF: 32.4 %
Height: 70 in
P 1/2 time: 639 msec
S' Lateral: 5.1 cm
Single Plane A2C EF: 28.9 %
Single Plane A4C EF: 36.1 %
Weight: 2960 oz

## 2022-11-12 LAB — CBG MONITORING, ED
Glucose-Capillary: 121 mg/dL — ABNORMAL HIGH (ref 70–99)
Glucose-Capillary: 136 mg/dL — ABNORMAL HIGH (ref 70–99)
Glucose-Capillary: 96 mg/dL (ref 70–99)

## 2022-11-12 LAB — APTT
aPTT: 49 seconds — ABNORMAL HIGH (ref 24–36)
aPTT: 54 seconds — ABNORMAL HIGH (ref 24–36)

## 2022-11-12 LAB — HEMOGLOBIN A1C
Hgb A1c MFr Bld: 7 % — ABNORMAL HIGH (ref 4.8–5.6)
Mean Plasma Glucose: 154.2 mg/dL

## 2022-11-12 LAB — MAGNESIUM: Magnesium: 1.9 mg/dL (ref 1.7–2.4)

## 2022-11-12 LAB — HEPARIN LEVEL (UNFRACTIONATED): Heparin Unfractionated: 0.96 IU/mL — ABNORMAL HIGH (ref 0.30–0.70)

## 2022-11-12 MED ORDER — PERFLUTREN LIPID MICROSPHERE
1.0000 mL | INTRAVENOUS | Status: AC | PRN
  Administered 2022-11-12: 2 mL via INTRAVENOUS

## 2022-11-12 MED ORDER — SODIUM CHLORIDE 0.9 % IV SOLN: INTRAVENOUS | Status: DC

## 2022-11-12 MED ORDER — ASPIRIN 81 MG PO CHEW
81.0000 mg | CHEWABLE_TABLET | ORAL | Status: AC
  Administered 2022-11-13: 81 mg via ORAL
  Filled 2022-11-12: qty 1

## 2022-11-12 MED ORDER — MAGNESIUM SULFATE 2 GM/50ML IV SOLN
2.0000 g | Freq: Once | INTRAVENOUS | Status: AC
  Administered 2022-11-12: 2 g via INTRAVENOUS
  Filled 2022-11-12: qty 50

## 2022-11-12 MED ORDER — FUROSEMIDE 10 MG/ML IJ SOLN
40.0000 mg | Freq: Every day | INTRAMUSCULAR | Status: DC
  Administered 2022-11-13: 40 mg via INTRAVENOUS
  Filled 2022-11-12: qty 4

## 2022-11-12 MED ORDER — SODIUM CHLORIDE 0.9 % IV SOLN: 250.0000 mL | INTRAVENOUS | Status: DC | PRN

## 2022-11-12 MED ORDER — HEPARIN BOLUS VIA INFUSION
2500.0000 [IU] | Freq: Once | INTRAVENOUS | Status: AC
  Administered 2022-11-12: 2500 [IU] via INTRAVENOUS
  Filled 2022-11-12: qty 2500

## 2022-11-12 MED ORDER — DOFETILIDE 250 MCG PO CAPS: 250.0000 ug | ORAL_CAPSULE | Freq: Two times a day (BID) | ORAL | Status: DC

## 2022-11-12 MED ORDER — DOFETILIDE 250 MCG PO CAPS
250.0000 ug | ORAL_CAPSULE | Freq: Two times a day (BID) | ORAL | Status: DC
  Administered 2022-11-12 (×2): 250 ug via ORAL
  Filled 2022-11-12 (×4): qty 1

## 2022-11-12 MED ORDER — SODIUM CHLORIDE 0.9% FLUSH: 3.0000 mL | INTRAVENOUS | Status: DC | PRN

## 2022-11-12 MED ORDER — SODIUM CHLORIDE 0.9% FLUSH
3.0000 mL | Freq: Two times a day (BID) | INTRAVENOUS | Status: DC
  Administered 2022-11-12 – 2022-11-13 (×2): 3 mL via INTRAVENOUS

## 2022-11-12 NOTE — Consult Note (Signed)
Cardiology Consultation   Patient ID: Edward Mcdaniel MRN: [id number]; DOB: [birth year]  Admit date: 11/11/2022 Date of Consult: 11/12/2022  PCP:  [facility] Cardiologist:  None  Electrophysiologist:  Vickie Epley, MD       Patient Profile:   Edward Mcdaniel is a 87 y.o. male with a hx of essential hypertension, persistent atrial fibrillation who underwent PVI in 02/16/2021, cardiomyopathy, asthma, OSA, type 2 diabetes, hyperlipidemia, who is being seen 11/12/2022 for the evaluation of shortness of breath and elevated high-sensitivity troponin at the request of Dr. Tobie Poet .  History of Present Illness:   Edward Mcdaniel 87 year old male with previously mentioned past medical history of essential hypertension, persistent atrial fibrillation who underwent PVI in 02/20/2021, he did have recurrent atrial fibrillation after the procedure and was ultimately loaded on Tikosyn and underwent elective cardioversion 12/27/2021, remained on apixaban for stroke prophylaxis, cardiomyopathy, asthma, OSA, type 2 diabetes, hyperlipidemia.  He presented to the [facility] [place] emergency department on 11/11/2022 with complaints of shortness of breath and weakness.  Patient was stating that he was feeling fatigued for approximately the last 6 months along with dyspnea on exertion.  He continues to try to play golf but often has to stop because of the shortness of breath.  He stating that he does not feel worse over the last week or so.  He denied any notable lower extremity swelling or weight gain.  He stated that he was compliant with all of his medications.  States that he does take his furosemide but is never been formally diagnosed with congestive heart failure.  When asked about chest discomfort he stated he was having chest pain specifically on Saturday that lasted most of the day with a tight feeling that was nonexertional and nonpleuritic.  He denied any chest discomfort on  arrival to the emergency department stating that his last bout was the day prior.  He stated that he went to his urgent care at the [facility] they did a chest x-ray that was concerning for some mild pulmonary vascular congestion and he was told to come to the emergency department for further evaluation.  According to chart review there was suspected reduced ejection fraction noted on bedside ultrasound per the ED provider.  Cardiology was consulted for new onset of congestive heart failure.  Initial vitals: blood pressure 123/66, pulse of 58, respirations of 18, temperature of 98.1  Pertinent labs: Blood glucose 199, calcium 8.8, RBCs 3.97, BNP 1033.4, high-sensitivity troponin of 43, 50, and 51  Imaging: Chest x-ray showed a constellation of findings BX consistent with pulmonary edema  Medications administered in the emergency department: Furosemide 40 mg IVP   Past Medical History:  Diagnosis Date   Allergic rhinitis    Asthma    DM type 2 (diabetes mellitus, type 2) (HCC)    diet controlled   HTN (hypertension)    Hyperlipidemia    LV dysfunction    Persistent atrial fibrillation (HCC)    Squamous cell carcinoma of skin 10/02/2011   right arm - tx p bx   Squamous cell carcinoma of skin 01/03/2014   in situ on right cheek - tx p bx   Squamous cell carcinoma of skin 07/08/2017   in situ on left hand - tx p bx   Squamous cell carcinoma of skin 03/25/2018   well differentiated on top of left hand - tx p bx   Squamous cell carcinoma of skin 02/15/2020  well differentiated on left zygomatic area cx3, excision    Past Surgical History:  Procedure Laterality Date   ATRIAL FIBRILLATION ABLATION N/A 02/16/2021   Procedure: ATRIAL FIBRILLATION ABLATION;  Surgeon: Vickie Epley, MD;  Location: [place] [facility];  Service: Cardiovascular;  Laterality: N/A;   CARDIOVERSION N/A 06/27/2021   Procedure: CARDIOVERSION;  Surgeon: [name], MD;  Location: [place];   Service: Cardiovascular;  Laterality: N/A;   CARDIOVERSION N/A 12/27/2021   Procedure: CARDIOVERSION;  Surgeon: Minna Merritts, MD;  Location: [facility];  Service: Cardiovascular;  Laterality: N/A;   HERNIA REPAIR     x 2   KNEE SURGERY     bilateral   LEFT HEART CATH AND CORONARY ANGIOGRAPHY N/A 12/14/2020   Procedure: LEFT HEART CATH AND CORONARY ANGIOGRAPHY;  Surgeon: Leonie Man, MD;  Location: [place] [facility];  Service: Cardiovascular;  Laterality: N/A;   RECTAL SURGERY     shoulder bone spur / tendon repair       Home Medications:  Prior to Admission medications   Medication Sig Start Date End Date Taking? Authorizing Provider  acetaminophen (TYLENOL) 500 MG tablet Take 1,000 mg by mouth every 6 (six) hours as needed for headache or mild pain.   Yes [provider]  apixaban (ELIQUIS) 5 MG TABS tablet Take 5 mg by mouth 2 (two) times daily.   Yes [provider]  Carboxymethylcellulose Sod PF 1 % GEL Place 1 drop into both eyes at bedtime.   Yes [provider]  dofetilide (TIKOSYN) 250 MCG capsule Take 1 capsule (250 mcg total) by mouth 2 (two) times daily. 06/28/21  Yes Baldwin Jamaica, PA-C  donepezil (ARICEPT) 5 MG tablet Take 5 mg by mouth at bedtime. 06/21/21  Yes [provider]  furosemide (LASIX) 20 MG tablet Take 1 tablet (20 mg total) by mouth daily as needed for fluid or edema (Swelling). 01/23/22  Yes Vickie Epley, MD  lisinopril (ZESTRIL) 40 MG tablet Take 40 mg by mouth in the morning.   Yes [provider]  loratadine (CLARITIN) 10 MG tablet Take 10 mg by mouth daily as needed.   Yes [provider]  metFORMIN (GLUCOPHAGE-XR) 500 MG 24 hr tablet Take 500 mg by mouth in the morning.   Yes [provider]  mirtazapine (REMERON) 15 MG tablet Take 0.5 tablets by mouth at bedtime. 04/25/21  Yes [provider]  pantoprazole (PROTONIX) 40 MG tablet Take 1 tablet (40 mg total) by mouth daily.  02/18/21  Yes Almyra Deforest, PA  pravastatin (PRAVACHOL) 40 MG tablet Take 40 mg by mouth at bedtime.   Yes [provider]  Propylene Glycol (SYSTANE BALANCE) 0.6 % SOLN Place 1 drop into both eyes 4 (four) times daily as needed (dry eyes).   Yes [provider]  tamsulosin (FLOMAX) 0.4 MG CAPS capsule Take 0.4 mg by mouth at bedtime.   Yes [provider]  albuterol (VENTOLIN HFA) 108 (90 Base) MCG/ACT inhaler Inhale 1-2 puffs into the lungs every 6 (six) hours as needed for wheezing or shortness of breath.    [provider]  MENTHOL-METHYL SALICYLATE EX Apply 1 application topically 2 (two) times daily as needed (for joint pain.).    [provider]    Inpatient Medications: Scheduled Meds:  furosemide  40 mg Intravenous BID   insulin aspart  0-15 Units Subcutaneous TID WC   insulin aspart  0-5 Units Subcutaneous QHS   pravastatin  40 mg Oral QHS   Continuous Infusions:  heparin 1,250 Units/hr (11/12/22 0826)   PRN Meds: acetaminophen **OR** acetaminophen, labetalol, nitroGLYCERIN, ondansetron **OR** ondansetron (ZOFRAN) IV, senna-docusate, white petrolatum  Allergies:    Allergies  Allergen Reactions   Amlodipine Other (See Comments)    Nightmares   Tape Rash    Eruption, Erythema    Social History:   Social History   Socioeconomic History   Marital status: Widowed    Spouse name: Not on file   Number of children: 3   Years of education: Not on file   Highest education level: Not on file  Occupational History   Occupation: retired > [employer]    Occupation: Retired  Tobacco Use   Smoking status: Former    Packs/day: 1.50    Years: 30.00    Total pack years: 45.00    Types: Cigarettes    Quit date: 10/08/1987    Years since quitting: 35.1   Smokeless tobacco: Current    Types: Chew  Vaping Use   Vaping Use: Never used  Substance and Sexual Activity   Alcohol use: Yes    Comment: 2 shots a week whiskey   Drug use: Never    Sexual activity: Not Currently  Other Topics Concern   Not on file  Social History Narrative   veteran   Social Determinants of Health   Financial Resource Strain: Not on file  Food Insecurity: No [place] (11/12/2022)   Hunger Vital Sign    Worried About Running Out of Food in the Last Year: Never true    [place] in the Last Year: Never true  Transportation Needs: No Transportation Needs (11/12/2022)   PRAPARE - Hydrologist (Medical): No    Lack of Transportation (Non-Medical): No  Physical Activity: Not on file  Stress: Not on file  Social Connections: Not on file  Intimate Partner Violence: Not At Risk (11/12/2022)   Humiliation, Afraid, Rape, and Kick questionnaire    Fear of Current or Ex-Partner: No    Emotionally Abused: No    Physically Abused: No    Sexually Abused: No    Family History:    Family History  Problem Relation Age of Onset   Lung cancer Mother        smoker   Heart disease Mother        bypass, further details unclear   Heart disease Brother        bypass, further details unclear     ROS:  Please see the history of present illness.  Review of Systems  Constitutional:  Positive for malaise/fatigue.  Respiratory:  Positive for shortness of breath.   Cardiovascular:  Positive for chest pain and leg swelling.  Neurological:  Positive for weakness.    All other ROS reviewed and negative.     Physical Exam/Data:   Vitals:   11/12/22 0652 11/12/22 0730 11/12/22 0930 11/12/22 1000  BP: 138/83 (!) 146/81 139/78 136/75  Pulse: (!) 55 (!) 55 (!) 56 (!) 50  Resp: 20 17    Temp: 98.1 F (36.7 C)     TempSrc: Oral     SpO2: 95% 98% 96% 97%  Weight:      Height:        Intake/Output Summary (Last 24 hours) at 11/12/2022 1017 Last data filed at 11/11/2022 2131 Gross per 24 hour  Intake --  Output 1350 ml  Net -1350 ml  11/11/2022    9:50 PM 09/18/2022    8:13 AM 01/23/2022   11:09 AM  Last 3  Weights  Weight (lbs) 185 lb 186 lb 180 lb  Weight (kg) 83.915 kg 84.369 kg 81.647 kg     Body mass index is 26.54 kg/m.  General:  Well nourished, well developed, in no acute distress HEENT: normal, glasses on Neck: no JVD Vascular: No carotid bruits; Distal pulses 2+ bilaterally Cardiac:  normal S1, S2; RRR; bradycardiac, no murmur  Lungs:  clear to auscultation bilaterally, no wheezing, rhonchi or rales  Abd: soft, nontender, no hepatomegaly  Ext: no edema Musculoskeletal:  No deformities, BUE and BLE strength normal and equal Skin: warm and dry  Neuro:  CNs 2-12 intact, no focal abnormalities noted Psych:  Normal affect   EKG:  The EKG was personally reviewed and demonstrates: Sinus bradycardia rate of 51 with unifocal PVCs, first degree AVB, ST depression T wave inversion V4 through V6, LVH secondary to early repolarization, left bundle branch block that originally appeared 12/27/2021 Telemetry:  Telemetry was personally reviewed and demonstrates:  sinus brady, first degree AVB, unifocal PVC's with occasional trigeminal pattern, ST depression, TWI   Relevant CV Studies: Coronary CTA 02/09/2021 1. There is normal pulmonary vein drainage into the left atrium with ostial measurements above.   2. There is no thrombus in the left atrial appendage.   3. The esophagus runs in the left atrial midline in proximity to left lower pulmonary vein ostia.   4. No PFO/ASD.   5. Normal coronary origin. Right dominance.   6. CAC score of 4135 which is 93 percentile for age-, race-, and sex-matched controls.  TTE 01/30/21  1. Left ventricular ejection fraction, by estimation, is 55 to 60%. The  left ventricle has normal function. The left ventricle has no regional  wall motion abnormalities. There is mild concentric left ventricular  hypertrophy. Left ventricular diastolic  function could not be evaluated.   2. Right ventricular systolic function is normal. The right ventricular  size is  mildly enlarged.   3. Left atrial size was moderately dilated.   4. The mitral valve is normal in structure. Mild mitral valve  regurgitation. No evidence of mitral stenosis.   5. The aortic valve is normal in structure. Aortic valve regurgitation is  mild. No aortic stenosis is present.   6. There is mild dilatation of the aortic root, measuring 39 mm. There is  mild dilatation of the ascending aorta, measuring 43 mm.   7. The inferior vena cava is normal in size with greater than 50%  respiratory variability, suggesting right atrial pressure of 3 mmHg.   LHC 12/2020 Ost Cx to Dist Cx lesion is 15% stenosed with 20% stenosed side branch in 3rd Mrg. Prox LAD to Mid LAD lesion is 20% stenosed with 25% stenosed side branch in 1st Diag. LV end diastolic pressure is normal. Angiographically minimal CAD with diffuse calcification but no significant stenosis. LV gram not performed in order to conserve contrast, relatively normal LVEDP. RECOMMENDATIONS: Return to nursing for ongoing care. We will write to start heparin pending decision about potential pacemaker.  If no PPM, would restart Eliquis tonight. Gentle post-cath hydration  Laboratory Data:  High Sensitivity Troponin:   Recent Labs  Lab 11/11/22 1235 11/11/22 1810 11/11/22 2152  TROPONINIHS 43* 50* 51*     Chemistry Recent Labs  Lab 11/11/22 1235 11/12/22 0627  NA 137 137  K 3.8 4.0  CL 103 103  CO2  25 24  GLUCOSE 199* 137*  BUN 17 20  CREATININE 1.06 0.99  CALCIUM 8.8* 8.6*  GFRNONAA >60 >60  ANIONGAP 9 10    No results for input(s): "PROT", "ALBUMIN", "AST", "ALT", "ALKPHOS", "BILITOT" in the last 168 hours. Lipids No results for input(s): "CHOL", "TRIG", "HDL", "LABVLDL", "LDLCALC", "CHOLHDL" in the last 168 hours.  Hematology Recent Labs  Lab 11/11/22 1235 11/12/22 0627  WBC 7.5 6.4  RBC 3.97* 3.79*  HGB 13.0 12.2*  HCT 39.2 36.9*  MCV 98.7 97.4  MCH 32.7 32.2  MCHC 33.2 33.1  RDW 13.2 13.2  PLT 162  149*   Thyroid No results for input(s): "TSH", "FREET4" in the last 168 hours.  BNP Recent Labs  Lab 11/11/22 1235  BNP 1,033.4*    DDimer No results for input(s): "DDIMER" in the last 168 hours.   Radiology/Studies:  DG Chest 2 View  Result Date: 11/11/2022 CLINICAL DATA:  chf EXAM: CHEST - 2 VIEW COMPARISON:  December 12, 2020. FINDINGS: The cardiomediastinal silhouette is unchanged and enlarged in contour.Atherosclerotic calcifications of the tortuous thoracic aorta. Vascular congestion with cephalization of the vasculature. No pleural effusion. No pneumothorax. Visualized abdomen is unremarkable. Mild degenerative changes of the thoracic spine. IMPRESSION: Constellation of findings most consistent with mild pulmonary edema. Electronically Signed   By: Valentino Saxon M.D.   On: 11/11/2022 13:45     Assessment and Plan:   Heart failure exacerbation -Echocardiogram ordered and pending further recommendations to follow -BNP 1033.4 -Presented worsening dyspnea on exertion of shortness of breath for the past 6 months that has progressively increased with activity -Continued on furosemide 40 mg IV twice daily -Daily weight, I's and O's, low-sodium diet -Initiate heart failure education  Elevated high-sensitivity troponin likely secondary from demand ischemia -High-sensitivity troponin trended 43, 50, 51, trended flat -Low suspicion for ACS -Likely demand ischemia secondary from elevated BNP of over thousand -Continue with cardiac monitoring -Currently on heparin drip can continue for 48 hours for elevated high-sensitivity troponins then discontinue with restart of PTA apixaban -EKG as needed for pain or changes -no LHC scheduled for today  Persistent atrial fibrillation status post PVI and DCCV -Previously with PVI inserted 02/20/2021, recurrent atrial fibrillation after procedure and underwent DC DCCV in 12/2021 -Currently on heparin drip and lieu of apixaban 5 mg twice daily for  CHA2DS2-VASc score of at least 5 -Previously was on Tikosyn, will need to be restarted -Continue with telemetry monitoring  Essential hypertension -Blood pressure 139/78 -Currently on furosemide and PTA medications remain on hold -Vital signs per unit protocol  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  New York Heart Association (NYHA) Functional Class NYHA Class III    CHA2DS2-VASc Score = 5   This indicates a 7.2% annual risk of stroke. The patient's score is based upon: CHF History: 1 HTN History: 1 Diabetes History: 1 Stroke History: 0 Vascular Disease History: 0 Age Score: 2 Gender Score: 0         For questions or updates, please contact  [facility] Please consult [email or web address] for contact info under    Signed, Khristian Seals, NP  11/12/2022 10:17 AM

## 2022-11-12 NOTE — Progress Notes (Addendum)
PROGRESS NOTE    Edward Mcdaniel  EAV:[id number] DOB: 1936-02-15 DOA: 11/11/2022 PCP: [facility]   Brief Narrative:  87 year old male with history of CAD, hypertension, atrial fibrillation status post pulmonary vein intervention, non-insulin-dependent diabetes mellitus, hyperlipidemia presented with chest pain and worsening shortness of breath.  On presentation, BNP was 1033.4 and high-sensitivity troponin was 43.  Subsequent high-sensitivity troponins were 50 and 51.  Chest x-ray showed possible mild pulmonary edema.  He was started on IV Lasix.  Assessment & Plan:   Possible new onset unspecified congestive heart failure Elevated troponin -Presented with chest pain and worsening shortness of breath and was found to have elevated BNP and slightly positive high sensitive troponins with chest x-ray showing mild pulmonary edema. -Cardiology evaluation pending.  Continue IV Lasix.  Strict input and output.  Daily weights.  Fluid restriction.  Echo pending.  Currently chest pain-free.  Troponins did not trend up significantly.  Currently on heparin drip.  Hyperlipidemia -Continue statin  Diabetes mellitus type 2 with hyperglycemia -Continue CBGs with SSI  Persistent A-fib -Currently on heparin drip.  Mildly bradycardic  Essential hypertension -Monitor blood pressure.  Currently on IV labetalol as needed Normocytic anemia -Questionable cause.  Monitor intermittently  Thrombocytopenia -Mild.  Monitor intermittently.  No signs of bleeding  Squamous cell carcinoma of skin of lower lip - Per patient, he is is status post biopsy - Will need appropriate follow-up outpatient - Will need cessation of tobacco chewing  DVT prophylaxis: Heparin drip Code Status: Full Family Communication: None at bedside Disposition Plan: Status is: Inpatient Remains inpatient appropriate because: Of severity of illness  Consultants: Cardiology  Procedures: None  Antimicrobials:  None   Subjective: Patient seen and examined at bedside.  Feels slightly better but still short of breath with exertion.  Denies any current chest pain.  No fever or vomiting reported.  Objective: Vitals:   11/12/22 0652 11/12/22 0730 11/12/22 0930 11/12/22 1000  BP: 138/83 (!) 146/81 139/78 136/75  Pulse: (!) 55 (!) 55 (!) 56 (!) 50  Resp: 20 17    Temp: 98.1 F (36.7 C)     TempSrc: Oral     SpO2: 95% 98% 96% 97%  Weight:      Height:        Intake/Output Summary (Last 24 hours) at 11/12/2022 1020 Last data filed at 11/11/2022 2131 Gross per 24 hour  Intake --  Output 1350 ml  Net -1350 ml   Filed Weights   11/11/22 2150  Weight: 83.9 kg    Examination:  General exam: Appears calm and comfortable.  Currently on room air. Respiratory system: Bilateral decreased breath sounds at bases with basilar crackles Cardiovascular system: S1 & S2 heard, Rate controlled Gastrointestinal system: Abdomen is nondistended, soft and nontender. Normal bowel sounds heard. Extremities: No cyanosis, clubbing hemocculted lower extremity edema present Central nervous system: Alert and oriented. No focal neurological deficits. Moving extremities Skin: No rashes, lesions or ulcers Psychiatry: Judgement and insight appear normal. Mood & affect appropriate.     Data Reviewed: I have personally reviewed following labs and imaging studies  CBC: Recent Labs  Lab 11/11/22 1235 11/12/22 0627  WBC 7.5 6.4  HGB 13.0 12.2*  HCT 39.2 36.9*  MCV 98.7 97.4  PLT 162 782*   Basic Metabolic Panel: Recent Labs  Lab 11/11/22 1235 11/12/22 0627  NA 137 137  K 3.8 4.0  CL 103 103  CO2 25 24  GLUCOSE 199* 137*  BUN 17 20  CREATININE 1.06 0.99  CALCIUM 8.8* 8.6*   GFR: Estimated Creatinine Clearance: 57.4 mL/min (by C-G formula based on SCr of 0.99 mg/dL). Liver Function Tests: No results for input(s): "AST", "ALT", "ALKPHOS", "BILITOT", "PROT", "ALBUMIN" in the last 168 hours. No results  for input(s): "LIPASE", "AMYLASE" in the last 168 hours. No results for input(s): "AMMONIA" in the last 168 hours. Coagulation Profile: Recent Labs  Lab 11/11/22 2152  INR 1.4*   Cardiac Enzymes: No results for input(s): "CKTOTAL", "CKMB", "CKMBINDEX", "TROPONINI" in the last 168 hours. BNP (last 3 results) No results for input(s): "PROBNP" in the last 8760 hours. HbA1C: No results for input(s): "HGBA1C" in the last 72 hours. CBG: Recent Labs  Lab 11/11/22 2332 11/12/22 0824  GLUCAP 192* 121*   Lipid Profile: No results for input(s): "CHOL", "HDL", "LDLCALC", "TRIG", "CHOLHDL", "LDLDIRECT" in the last 72 hours. Thyroid Function Tests: No results for input(s): "TSH", "T4TOTAL", "FREET4", "T3FREE", "THYROIDAB" in the last 72 hours. Anemia Panel: No results for input(s): "VITAMINB12", "FOLATE", "FERRITIN", "TIBC", "IRON", "RETICCTPCT" in the last 72 hours. Sepsis Labs: No results for input(s): "PROCALCITON", "LATICACIDVEN" in the last 168 hours.  No results found for this or any previous visit (from the past 240 hour(s)).       Radiology Studies: DG Chest 2 View  Result Date: 11/11/2022 CLINICAL DATA:  chf EXAM: CHEST - 2 VIEW COMPARISON:  December 12, 2020. FINDINGS: The cardiomediastinal silhouette is unchanged and enlarged in contour.Atherosclerotic calcifications of the tortuous thoracic aorta. Vascular congestion with cephalization of the vasculature. No pleural effusion. No pneumothorax. Visualized abdomen is unremarkable. Mild degenerative changes of the thoracic spine. IMPRESSION: Constellation of findings most consistent with mild pulmonary edema. Electronically Signed   By: Valentino Saxon M.D.   On: 11/11/2022 13:45        Scheduled Meds:  furosemide  40 mg Intravenous BID   insulin aspart  0-15 Units Subcutaneous TID WC   insulin aspart  0-5 Units Subcutaneous QHS   pravastatin  40 mg Oral QHS   Continuous Infusions:  heparin 1,250 Units/hr (11/12/22 0826)           Aline August, MD [facility] 11/12/2022, 10:20 AM

## 2022-11-12 NOTE — ED Notes (Signed)
Pt dropped tikosyn on floor, additional dose requested from pharmacy.

## 2022-11-12 NOTE — Consult Note (Signed)
[place] for IV Heparin Indication: chest pain/ACS and atrial fibrillation  Patient Measurements: Height: '5\' 10"'$  (177.8 cm) Weight: 83.9 kg (185 lb) IBW/kg (Calculated) : 73 Heparin Dosing Weight: 83.9 kg  Labs: Recent Labs    11/11/22 1235 11/11/22 1810 11/11/22 2152 11/12/22 0627 11/12/22 0657 11/12/22 1816  HGB 13.0  --   --  12.2*  --   --   HCT 39.2  --   --  36.9*  --   --   PLT 162  --   --  149*  --   --   APTT  --   --  36  --  49* 54*  LABPROT  --   --  16.8*  --   --   --   INR  --   --  1.4*  --   --   --   HEPARINUNFRC  --   --  >1.10*  --  0.96*  --   CREATININE 1.06  --   --  0.99  --   --   TROPONINIHS 43* 50* 51*  --   --   --      Estimated Creatinine Clearance: 57.4 mL/min (by C-G formula based on SCr of 0.99 mg/dL).   Medical History: Past Medical History:  Diagnosis Date   Allergic rhinitis    Asthma    DM type 2 (diabetes mellitus, type 2) (HCC)    diet controlled   HTN (hypertension)    Hyperlipidemia    LV dysfunction    Persistent atrial fibrillation (HCC)    Squamous cell carcinoma of skin 10/02/2011   right arm - tx p bx   Squamous cell carcinoma of skin 01/03/2014   in situ on right cheek - tx p bx   Squamous cell carcinoma of skin 07/08/2017   in situ on left hand - tx p bx   Squamous cell carcinoma of skin 03/25/2018   well differentiated on top of left hand - tx p bx   Squamous cell carcinoma of skin 02/15/2020   well differentiated on left zygomatic area cx3, excision    Medications:  Apixaban 5 mg BID prior to admission-Afib, last patient reported dose was 11/11/22 AM  Assessment: 87 y/o M with medical history including Afib on apixaban admitted with new-onset CHF and elevated troponin. Pharmacy consulted to initiate and manage heparin infusion for ACS. Home apixaban for Afib on hold pending further evaluation.   Baseline aPTT=36, INR=1.4, and heparin level >1.10  Baseline CBC within  normal limits.  0206 0657 aPTT 49, HL 0.96; Subthera, 1000 un/hr 0206 1816 aPTT 54; Subthera, 1250 un/hr   Goal of Therapy:  Heparin level 0.3-0.7 units/ml aPTT 66 - 102 seconds Monitor platelets by anticoagulation protocol: Yes   Plan:  --aPTT is subtherapeutic --Heparin 2500 unit IV bolus and increase heparin infusion rate to 1500 units/hr --Re-check aPTT and HL 8 hours from rate change --Switch over to heparin level monitoring when correlation established given anticipation of interference of apixaban on heparin level assay --Daily CBC per protocol while on IV heparin  Benita Gutter 11/12/2022,7:21 PM

## 2022-11-12 NOTE — Discharge Instructions (Signed)

## 2022-11-12 NOTE — ED Notes (Signed)
This RN to bedside. IV in R forearm bloody and appears infiltrated. IV removed at this time, arm cleaned and wrapped with coban. Heparin gtt infusing through L PIV at this time. WCTM.

## 2022-11-12 NOTE — Consult Note (Signed)
[place] for IV Heparin Indication: chest pain/ACS and atrial fibrillation  Patient Measurements: Height: '5\' 10"'$  (177.8 cm) Weight: 83.9 kg (185 lb) IBW/kg (Calculated) : 73 Heparin Dosing Weight: 83.9 kg  Labs: Recent Labs    11/11/22 1235 11/11/22 1810 11/11/22 2152 11/12/22 0627 11/12/22 0657  HGB 13.0  --   --  12.2*  --   HCT 39.2  --   --  36.9*  --   PLT 162  --   --  149*  --   APTT  --   --  36  --  49*  LABPROT  --   --  16.8*  --   --   INR  --   --  1.4*  --   --   HEPARINUNFRC  --   --  >1.10*  --  0.96*  CREATININE 1.06  --   --  0.99  --   TROPONINIHS 43* 50* 51*  --   --      Estimated Creatinine Clearance: 57.4 mL/min (by C-G formula based on SCr of 0.99 mg/dL).   Medical History: Past Medical History:  Diagnosis Date   Allergic rhinitis    Asthma    DM type 2 (diabetes mellitus, type 2) (HCC)    diet controlled   HTN (hypertension)    Hyperlipidemia    LV dysfunction    Persistent atrial fibrillation (HCC)    Squamous cell carcinoma of skin 10/02/2011   right arm - tx p bx   Squamous cell carcinoma of skin 01/03/2014   in situ on right cheek - tx p bx   Squamous cell carcinoma of skin 07/08/2017   in situ on left hand - tx p bx   Squamous cell carcinoma of skin 03/25/2018   well differentiated on top of left hand - tx p bx   Squamous cell carcinoma of skin 02/15/2020   well differentiated on left zygomatic area cx3, excision    Medications:  Apixaban 5 mg BID prior to admission-Afib, last patient reported dose was 11/11/22 AM  Assessment: 87 y/o M with medical history including Afib on apixaban admitted with new-onset CHF and elevated troponin. Pharmacy consulted to initiate and manage heparin infusion for ACS. Home apixaban for Afib on hold pending further evaluation.   Baseline aPTT=36, INR=1.4, and heparin level >1.10  Baseline CBC within normal limits.  2/6 0657  aPTT=49, HL=0.96       Subtherapeutic, levels not correlating, follow aPTT   Goal of Therapy:  Heparin level 0.3-0.7 units/ml aPTT 66 - 102 seconds Monitor platelets by anticoagulation protocol: Yes   Plan:  2/6 0657  aPTT=49, HL=0.96      Subtherapeutic, levels not correlating, follow aPTT --Heparin bolus of 2500 units x1 and increase drip to 1250 units/hr --check aPTT 8 hours from rate change. - F/u heparin level tomorrow AM.  Switch over to heparin level monitoring when correlation established given anticipation of interference of apixaban on heparin level assay --Daily CBC per protocol while on IV heparin  Annina Piotrowski A 11/12/2022,7:52 AM

## 2022-11-12 NOTE — Progress Notes (Signed)
*  PRELIMINARY RESULTS* Echocardiogram 2D Echocardiogram has been performed.  Sherrie Sport 11/12/2022, 1:22 PM

## 2022-11-13 ENCOUNTER — Encounter
Admission: EM | Disposition: A | Discharge status: Home / Self Care | Payer: Self-pay | Source: Home / Self Care | Attending: Internal Medicine

## 2022-11-13 DIAGNOSIS — E119 Type 2 diabetes mellitus without complications: Secondary | ICD-10-CM | POA: Diagnosis not present

## 2022-11-13 DIAGNOSIS — I10 Essential (primary) hypertension: Secondary | ICD-10-CM | POA: Diagnosis not present

## 2022-11-13 DIAGNOSIS — I5023 Acute on chronic systolic (congestive) heart failure: Secondary | ICD-10-CM

## 2022-11-13 DIAGNOSIS — I2510 Atherosclerotic heart disease of native coronary artery without angina pectoris: Secondary | ICD-10-CM

## 2022-11-13 DIAGNOSIS — E782 Mixed hyperlipidemia: Secondary | ICD-10-CM

## 2022-11-13 DIAGNOSIS — I509 Heart failure, unspecified: Secondary | ICD-10-CM | POA: Diagnosis not present

## 2022-11-13 DIAGNOSIS — R7989 Other specified abnormal findings of blood chemistry: Secondary | ICD-10-CM | POA: Diagnosis not present

## 2022-11-13 HISTORY — PX: RIGHT/LEFT HEART CATH AND CORONARY ANGIOGRAPHY: CATH118266

## 2022-11-13 LAB — LIPID PANEL
Cholesterol: 106 mg/dL (ref 0–200)
HDL: 45 mg/dL (ref >40)
LDL Cholesterol: 54 mg/dL (ref 0–99)
Total CHOL/HDL Ratio: 2.4 RATIO
Triglycerides: 34 mg/dL (ref <150)
VLDL: 7 mg/dL (ref 0–40)

## 2022-11-13 LAB — CBC
HCT: 40 % (ref 39.0–52.0)
Hemoglobin: 13.4 g/dL (ref 13.0–17.0)
MCH: 32.1 pg (ref 26.0–34.0)
MCHC: 33.5 g/dL (ref 30.0–36.0)
MCV: 95.9 fL (ref 80.0–100.0)
Platelets: 170 10*3/uL (ref 150–400)
RBC: 4.17 MIL/uL — ABNORMAL LOW (ref 4.22–5.81)
RDW: 13.1 % (ref 11.5–15.5)
WBC: 5.9 10*3/uL (ref 4.0–10.5)
nRBC: 0 % (ref 0.0–0.2)

## 2022-11-13 LAB — HEPARIN LEVEL (UNFRACTIONATED): Heparin Unfractionated: 0.71 IU/mL — ABNORMAL HIGH (ref 0.30–0.70)

## 2022-11-13 LAB — POCT I-STAT EG7
Acid-Base Excess: 2 mmol/L (ref 0.0–2.0)
Bicarbonate: 26.8 mmol/L (ref 20.0–28.0)
Calcium, Ion: 1.16 mmol/L (ref 1.15–1.40)
HCT: 43 % (ref 39.0–52.0)
Hemoglobin: 14.6 g/dL (ref 13.0–17.0)
O2 Saturation: 68 %
Potassium: 3.7 mmol/L (ref 3.5–5.1)
Sodium: 140 mmol/L (ref 135–145)
TCO2: 28 mmol/L (ref 22–32)
pCO2, Ven: 42.7 mmHg — ABNORMAL LOW (ref 44–60)
pH, Ven: 7.406 (ref 7.25–7.43)
pO2, Ven: 35 mmHg (ref 32–45)

## 2022-11-13 LAB — BASIC METABOLIC PANEL
Anion gap: 8 (ref 5–15)
BUN: 20 mg/dL (ref 8–23)
CO2: 26 mmol/L (ref 22–32)
Calcium: 8.6 mg/dL — ABNORMAL LOW (ref 8.9–10.3)
Chloride: 103 mmol/L (ref 98–111)
Creatinine, Ser: 0.97 mg/dL (ref 0.61–1.24)
GFR, Estimated: >60 mL/min (ref >60)
Glucose, Bld: 142 mg/dL — ABNORMAL HIGH (ref 70–99)
Potassium: 3.8 mmol/L (ref 3.5–5.1)
Sodium: 137 mmol/L (ref 135–145)

## 2022-11-13 LAB — APTT: aPTT: 130 seconds — ABNORMAL HIGH (ref 24–36)

## 2022-11-13 LAB — GLUCOSE, CAPILLARY
Glucose-Capillary: 143 mg/dL — ABNORMAL HIGH (ref 70–99)
Glucose-Capillary: 65 mg/dL — ABNORMAL LOW (ref 70–99)
Glucose-Capillary: 83 mg/dL (ref 70–99)
Glucose-Capillary: 162 mg/dL — ABNORMAL HIGH (ref 70–99)
Glucose-Capillary: 184 mg/dL — ABNORMAL HIGH (ref 70–99)

## 2022-11-13 LAB — MAGNESIUM: Magnesium: 2 mg/dL (ref 1.7–2.4)

## 2022-11-13 SURGERY — RIGHT/LEFT HEART CATH AND CORONARY ANGIOGRAPHY
Anesthesia: Moderate Sedation

## 2022-11-13 MED ORDER — MIRTAZAPINE 7.5 MG PO TABS
7.5000 mg | ORAL_TABLET | Freq: Every day | ORAL | Status: DC
  Administered 2022-11-13: 7.5 mg via ORAL
  Filled 2022-11-13: qty 1

## 2022-11-13 MED ORDER — HEPARIN (PORCINE) IN NACL 2000-0.9 UNIT/L-% IV SOLN
INTRAVENOUS | Status: DC | PRN
  Administered 2022-11-13: 1000 mL

## 2022-11-13 MED ORDER — VERAPAMIL HCL 2.5 MG/ML IV SOLN
INTRAVENOUS | Status: AC
  Filled 2022-11-13: qty 2

## 2022-11-13 MED ORDER — HEPARIN (PORCINE) 25000 UT/250ML-% IV SOLN
1350.0000 [IU]/h | INTRAVENOUS | Status: DC
  Administered 2022-11-13: 1350 [IU]/h via INTRAVENOUS
  Filled 2022-11-13: qty 250

## 2022-11-13 MED ORDER — LIP MEDEX EX OINT: TOPICAL_OINTMENT | CUTANEOUS | Status: DC | PRN

## 2022-11-13 MED ORDER — POTASSIUM CHLORIDE CRYS ER 20 MEQ PO TBCR
40.0000 meq | EXTENDED_RELEASE_TABLET | ORAL | Status: AC
  Administered 2022-11-13: 40 meq via ORAL
  Filled 2022-11-13: qty 2

## 2022-11-13 MED ORDER — SODIUM CHLORIDE 0.9 % IV SOLN: INTRAVENOUS | Status: AC

## 2022-11-13 MED ORDER — LOSARTAN POTASSIUM 25 MG PO TABS
12.5000 mg | ORAL_TABLET | Freq: Every day | ORAL | Status: DC
  Administered 2022-11-13 – 2022-11-14 (×2): 12.5 mg via ORAL
  Filled 2022-11-13 (×2): qty 1

## 2022-11-13 MED ORDER — HEPARIN SODIUM (PORCINE) 1000 UNIT/ML IJ SOLN
INTRAMUSCULAR | Status: AC
  Filled 2022-11-13: qty 10

## 2022-11-13 MED ORDER — SODIUM CHLORIDE 0.9 % IV SOLN: 250.0000 mL | INTRAVENOUS | Status: DC | PRN

## 2022-11-13 MED ORDER — TAMSULOSIN HCL 0.4 MG PO CAPS
0.4000 mg | ORAL_CAPSULE | Freq: Every day | ORAL | Status: DC
  Administered 2022-11-13: 0.4 mg via ORAL
  Filled 2022-11-13: qty 1

## 2022-11-13 MED ORDER — MIDAZOLAM HCL 2 MG/2ML IJ SOLN
INTRAMUSCULAR | Status: AC
  Filled 2022-11-13: qty 2

## 2022-11-13 MED ORDER — SODIUM CHLORIDE 0.9% FLUSH
3.0000 mL | Freq: Two times a day (BID) | INTRAVENOUS | Status: DC
  Administered 2022-11-13 – 2022-11-14 (×2): 3 mL via INTRAVENOUS

## 2022-11-13 MED ORDER — HEPARIN (PORCINE) IN NACL 1000-0.9 UT/500ML-% IV SOLN
INTRAVENOUS | Status: AC
  Filled 2022-11-13: qty 1000

## 2022-11-13 MED ORDER — HEPARIN SODIUM (PORCINE) 1000 UNIT/ML IJ SOLN
INTRAMUSCULAR | Status: DC | PRN
  Administered 2022-11-13: 4500 [IU] via INTRAVENOUS

## 2022-11-13 MED ORDER — HYDRALAZINE HCL 20 MG/ML IJ SOLN: 10.0000 mg | INTRAMUSCULAR | Status: AC | PRN

## 2022-11-13 MED ORDER — DOFETILIDE 250 MCG PO CAPS
250.0000 ug | ORAL_CAPSULE | Freq: Two times a day (BID) | ORAL | Status: DC
  Administered 2022-11-13 – 2022-11-14 (×3): 250 ug via ORAL
  Filled 2022-11-13 (×4): qty 1

## 2022-11-13 MED ORDER — DAPAGLIFLOZIN PROPANEDIOL 10 MG PO TABS
10.0000 mg | ORAL_TABLET | Freq: Every day | ORAL | Status: DC
  Administered 2022-11-13 – 2022-11-14 (×2): 10 mg via ORAL
  Filled 2022-11-13 (×2): qty 1

## 2022-11-13 MED ORDER — IOHEXOL 300 MG/ML  SOLN
INTRAMUSCULAR | Status: DC | PRN
  Administered 2022-11-13: 30 mL

## 2022-11-13 MED ORDER — SODIUM CHLORIDE 0.9% FLUSH: 3.0000 mL | INTRAVENOUS | Status: DC | PRN

## 2022-11-13 MED ORDER — FENTANYL CITRATE (PF) 100 MCG/2ML IJ SOLN
INTRAMUSCULAR | Status: AC
  Filled 2022-11-13: qty 2

## 2022-11-13 MED ORDER — VERAPAMIL HCL 2.5 MG/ML IV SOLN
INTRAVENOUS | Status: DC | PRN
  Administered 2022-11-13: 2.5 mg via INTRAVENOUS
  Administered 2022-11-13: 2.5 mg via INTRA_ARTERIAL

## 2022-11-13 MED ORDER — INSULIN ASPART 100 UNIT/ML IJ SOLN
0.0000 [IU] | Freq: Three times a day (TID) | INTRAMUSCULAR | Status: DC
  Administered 2022-11-13: 2 [IU] via SUBCUTANEOUS
  Filled 2022-11-13: qty 1

## 2022-11-13 SURGICAL SUPPLY — 15 items
BAND CMPR LRG ZPHR (HEMOSTASIS) ×1
BAND ZEPHYR COMPRESS 30 LONG (HEMOSTASIS) IMPLANT
CATH 5F 110X4 TIG (CATHETERS) IMPLANT
CATH BALLN WEDGE 5F 110CM (CATHETERS) IMPLANT
DRAPE BRACHIAL (DRAPES) IMPLANT
GLIDESHEATH SLEND SS 6F .021 (SHEATH) IMPLANT
GUIDEWIRE .025 260CM (WIRE) IMPLANT
GUIDEWIRE INQWIRE 1.5J.035X260 (WIRE) IMPLANT
INQWIRE 1.5J .035X260CM (WIRE) ×1
PACK CARDIAC CATH (CUSTOM PROCEDURE TRAY) ×2 IMPLANT
PAD ELECT DEFIB RADIOL ZOLL (MISCELLANEOUS) IMPLANT
PROTECTION STATION PRESSURIZED (MISCELLANEOUS) ×1
SET ATX SIMPLICITY (MISCELLANEOUS) IMPLANT
SHEATH GLIDE SLENDER 4/5FR (SHEATH) IMPLANT
STATION PROTECTION PRESSURIZED (MISCELLANEOUS) IMPLANT

## 2022-11-13 NOTE — Consult Note (Signed)
   Heart Failure Nurse Navigator Note  HFrEF 30-35%.  Moderate asymmetric left ventricular hypertrophy.  Diastolic parameters are indeterminate.  Mitral regurgitation.  Mild aortic insufficiency.  Ejection fraction had previously been reported at 55 to 60%.  He presented to the emergency room with complaints of chest pain and shortness of breath.  BNP 1033.  Chest x-ray with mild pulmonary edema.  Comorbidities:  Coronary artery disease Hypertension Atrial fibrillation status post pulmonary vein intervention Diabetes Hyperlipidemia Asthma  Medications:  Dofetilide 250 mcg 2 times a day Furosemide 40 mg IV daily Pravastatin 40 mg at bedtime  Not on beta-blocker due to sinus bradycardia.  Labs:  Sodium 137, potassium 3.8, chloride 103, CO2 26, BUN 20, creatinine 0.97, GFR greater than 60. Weight not documented Intake 509 mL Output 200 mL Blood pressure 120/69   Initial meeting with patient, he is lying in bed quietly awaiting his right and left heart cath.  Son was also present at the bedside.  Discussed heart failure and what it means.  Patient states that this time he does not know what his echocardiogram results were.  Discussed low-sodium diet, being the saltshaker from the table.  [place] patient states that he does not use salt.  Also went over fluid restriction constitutes a fluid.  Patient states that he quit weighing himself as his weight had remained steady.  Explained the importance of daily weights and reporting 2 pound weight gain overnight or 5 pounds within the week.  He voices understanding.  He was also made aware that he has not appointment in the outpatient heart failure clinic which is February 19 at 9 AM.  He has a 5% no-show which is 2 out of 43 appointments.   He had no further questions he was given the living with heart failure teaching booklet, zone magnet, info on low-sodium and heart failure along with weight chart.  Will continue to  follow.  Pricilla Riffle RN CHFN

## 2022-11-13 NOTE — Plan of Care (Signed)
  Problem: Education: Goal: Ability to demonstrate management of disease process will improve Outcome: Progressing Goal: Ability to verbalize understanding of medication therapies will improve Outcome: Progressing Goal: Individualized Educational Video(s) Outcome: Progressing   Problem: Activity: Goal: Capacity to carry out activities will improve Outcome: Progressing   Problem: Cardiac: Goal: Ability to achieve and maintain adequate cardiopulmonary perfusion will improve Outcome: Progressing   Problem: Education: Goal: Ability to describe self-care measures that may prevent or decrease complications (Diabetes Survival Skills Education) will improve Outcome: Progressing Goal: Individualized Educational Video(s) Outcome: Progressing   Problem: Coping: Goal: Ability to adjust to condition or change in health will improve Outcome: Progressing   Problem: Fluid Volume: Goal: Ability to maintain a balanced intake and output will improve Outcome: Progressing   Problem: Health Behavior/Discharge Planning: Goal: Ability to identify and utilize available resources and services will improve Outcome: Progressing Goal: Ability to manage health-related needs will improve Outcome: Progressing   Problem: Metabolic: Goal: Ability to maintain appropriate glucose levels will improve Outcome: Progressing   Problem: Nutritional: Goal: Maintenance of adequate nutrition will improve Outcome: Progressing Goal: Progress toward achieving an optimal weight will improve Outcome: Progressing   Problem: Skin Integrity: Goal: Risk for impaired skin integrity will decrease Outcome: Progressing   Problem: Tissue Perfusion: Goal: Adequacy of tissue perfusion will improve Outcome: Progressing   Problem: Education: Goal: Understanding of CV disease, CV risk reduction, and recovery process will improve Outcome: Progressing Goal: Individualized Educational Video(s) Outcome: Progressing    Problem: Activity: Goal: Ability to return to baseline activity level will improve Outcome: Progressing   Problem: Cardiovascular: Goal: Ability to achieve and maintain adequate cardiovascular perfusion will improve Outcome: Progressing Goal: Vascular access site(s) Level 0-1 will be maintained Outcome: Progressing   Problem: Health Behavior/Discharge Planning: Goal: Ability to safely manage health-related needs after discharge will improve Outcome: Progressing   Problem: Education: Goal: Knowledge of General Education information will improve Description: Including pain rating scale, medication(s)/side effects and non-pharmacologic comfort measures Outcome: Progressing   Problem: Health Behavior/Discharge Planning: Goal: Ability to manage health-related needs will improve Outcome: Progressing   Problem: Clinical Measurements: Goal: Ability to maintain clinical measurements within normal limits will improve Outcome: Progressing Goal: Will remain free from infection Outcome: Progressing Goal: Diagnostic test results will improve Outcome: Progressing Goal: Respiratory complications will improve Outcome: Progressing Goal: Cardiovascular complication will be avoided Outcome: Progressing   Problem: Activity: Goal: Risk for activity intolerance will decrease Outcome: Progressing   Problem: Nutrition: Goal: Adequate nutrition will be maintained Outcome: Progressing   Problem: Coping: Goal: Level of anxiety will decrease Outcome: Progressing   Problem: Elimination: Goal: Will not experience complications related to bowel motility Outcome: Progressing Goal: Will not experience complications related to urinary retention Outcome: Progressing   Problem: Pain Managment: Goal: General experience of comfort will improve Outcome: Progressing   Problem: Safety: Goal: Ability to remain free from injury will improve Outcome: Progressing   Problem: Skin Integrity: Goal:  Risk for impaired skin integrity will decrease Outcome: Progressing

## 2022-11-13 NOTE — Inpatient Diabetes Management (Signed)
Inpatient Diabetes Program Recommendations  AACE/ADA: New Consensus Statement on Inpatient Glycemic Control   Target Ranges:  Prepandial:   less than 140 mg/dL      Peak postprandial:   less than 180 mg/dL (1-2 hours)      Critically ill patients:  140 - 180 mg/dL    Latest Reference Range & Units 11/12/22 08:24 11/12/22 11:36 11/12/22 16:23 11/12/22 20:39 11/13/22 07:53 11/13/22 11:57  Glucose-Capillary 70 - 99 mg/dL 121 (H)  Novolog 2 units 136 (H)  Novolog 2 units 96 133 (H) 143 (H)  Novolog 2 units 65 (L)   Review of Glycemic Control  Diabetes history: DM2 Outpatient Diabetes medications: Metformin XR 500 mg QAM Current orders for Inpatient glycemic control: Novolog 0-15 units TID with meals, Novolog 0-5 units QHS  Inpatient Diabetes Program Recommendations:    Insulin: Noted glucose 65 mg/dl at 11:57 am today (following Novolog 2 units correction given at 8:55 am for glucose of 143 mg/dl at 7:53 am). Please consider decreasing Novolog correction to 0-9 units TID with meals.  Thanks, Barnie Alderman, RN, MSN, [place] [facility] Coordinator Inpatient Diabetes Program [phone] (Team Pager from 8am to [place])

## 2022-11-13 NOTE — TOC Initial Note (Signed)
Transition of Care [facility]) - Initial/Assessment Note    Patient Details  Name: Edward Mcdaniel MRN: [id number] Date of Birth: 04/01/1936  Transition of Care [facility]) CM/SW Contact:    Laurena Slimmer, RN Phone Number: 11/13/2022, 3:27 PM  Clinical Narrative:                  Transition of Care [facility]) Screening Note   Patient Details  Name: Edward Mcdaniel Date of Birth: 1936/04/03   Transition of Care [facility]) CM/SW Contact:    Laurena Slimmer, RN Phone Number: 11/13/2022, 3:27 PM    Transition of Care Department [facility]) has reviewed patient and no TOC needs have been identified at this time. We will continue to monitor patient advancement through interdisciplinary progression rounds. If new patient transition needs arise, please place a TOC consult.          Patient Goals and CMS Choice            Expected Discharge Plan and Services                                              Prior Living Arrangements/Services                       Activities of Daily Living Home Assistive Devices/Equipment: Cane (specify quad or straight), Hearing aid, Eyeglasses ADL Screening (condition at time of admission) Patient's cognitive ability adequate to safely complete daily activities?: Yes Is the patient deaf or have difficulty hearing?: No Does the patient have difficulty seeing, even when wearing glasses/contacts?: No Does the patient have difficulty concentrating, remembering, or making decisions?: No Patient able to express need for assistance with ADLs?: Yes Does the patient have difficulty dressing or bathing?: No Independently performs ADLs?: Yes (appropriate for developmental age) Does the patient have difficulty walking or climbing stairs?: No Weakness of Legs: None Weakness of Arms/Hands: None  Permission Sought/Granted                  Emotional Assessment              Admission diagnosis:  New onset of congestive heart failure ([place])  [I50.9] Congestive heart failure, unspecified HF chronicity, unspecified heart failure type ([place]) [I50.9] Patient Active Problem List   Diagnosis Date Noted   Acute on chronic HFrEF (heart failure with reduced ejection fraction) ([place]) 11/13/2022   New onset of congestive heart failure ([place]) 11/11/2022   Hyperlipidemia 11/11/2022   Elevated troponin 11/11/2022   Squamous cell carcinoma of skin of lower lip 11/11/2022   Secondary hypercoagulable state ([place]) 06/25/2021   Persistent atrial fibrillation ([place]) 06/25/2021   Atrial fibrillation ([place]) 02/16/2021   Atrial fibrillation with slow ventricular response ([place]) 12/12/2020   Cardiomyopathy ([place]) 12/12/2020   Precordial chest pain 12/12/2020   Chest pain 12/12/2020   Unstable angina (HCC)    OSA (obstructive sleep apnea)    Diabetes mellitus type 2 in nonobese ([place]) 11/03/2007   Essential hypertension 11/03/2007   Seasonal and perennial allergic rhinitis 11/03/2007   Asthma, mild intermittent 11/03/2007   PCP:  Clinic, [place]:   [place], [address] [place] [address] Phone: [phone] Fax: [phone]  [place], [place], [address] [place] [address] Phone: [phone] Fax: [place], [place] - [place] [address] [place] [address] Phone: [phone] Fax: [phone]  [facility] [address] [address] Phone: [phone] Fax: [phone]  [id number] [place] [address] [id number]: [phone]: [phone]    Social Determinants of Health (SDOH) Social History: SDOH Screenings   Food Insecurity: No Food Insecurity (11/12/2022)  Housing: Low Risk   (11/12/2022)  Transportation Needs: No Transportation Needs (11/12/2022)  Utilities: Not At Risk (11/12/2022)  Tobacco Use: High Risk (11/11/2022)   SDOH Interventions:     Readmission Risk Interventions     No data to display

## 2022-11-13 NOTE — Progress Notes (Signed)
Rounding Note    Patient Name: Edward Mcdaniel Date of Encounter: 11/13/2022  [place] Cardiologist: None   Subjective   Plan for R/L heart cath today. He denies chest pain, still feels short of breath at times.   Inpatient Medications    Scheduled Meds:  dofetilide  250 mcg Oral BID   furosemide  40 mg Intravenous Daily   insulin aspart  0-15 Units Subcutaneous TID WC   insulin aspart  0-5 Units Subcutaneous QHS   pravastatin  40 mg Oral QHS   sodium chloride flush  3 mL Intravenous Q12H   Continuous Infusions:  sodium chloride     sodium chloride Stopped (11/13/22 0558)   heparin 1,350 Units/hr (11/13/22 0700)   PRN Meds: sodium chloride, acetaminophen **OR** acetaminophen, labetalol, nitroGLYCERIN, ondansetron **OR** ondansetron (ZOFRAN) IV, senna-docusate, sodium chloride flush, white petrolatum   Vital Signs    Vitals:   11/12/22 1953 11/13/22 0013 11/13/22 0500 11/13/22 0752  BP: 129/77 123/60 (!) 145/90 130/73  Pulse: (!) 47 (!) 43 60 80  Resp: '18 16 15 16  '$ Temp: 98 F (36.7 C) (!) 97.5 F (36.4 C) 97.8 F (36.6 C) 97.9 F (36.6 C)  TempSrc: Oral  Oral   SpO2: 96% 97% 100% 95%  Weight:      Height:        Intake/Output Summary (Last 24 hours) at 11/13/2022 0828 Last data filed at 11/13/2022 0759 Gross per 24 hour  Intake 509.11 ml  Output 400 ml  Net 109.11 ml      11/11/2022    9:50 PM 09/18/2022    8:13 AM 01/23/2022   11:09 AM  Last 3 Weights  Weight (lbs) 185 lb 186 lb 180 lb  Weight (kg) 83.915 kg 84.369 kg 81.647 kg      Telemetry    NSR, 1st degree AV block Hr 80s - Personally Reviewed  ECG    No new - Personally Reviewed  Physical Exam   GEN: No acute distress.   Neck: No JVD Cardiac: RRR, no murmurs, rubs, or gallops.  Respiratory: Clear to auscultation bilaterally. GI: Soft, nontender, non-distended  MS: No edema; No deformity. Neuro:  Nonfocal  Psych: Normal affect   Labs    High Sensitivity Troponin:    Recent Labs  Lab 11/11/22 1235 11/11/22 1810 11/11/22 2152  TROPONINIHS 43* 50* 51*     Chemistry Recent Labs  Lab 11/11/22 1235 11/12/22 0627 11/13/22 0505  NA 137 137 137  K 3.8 4.0 3.8  CL 103 103 103  CO2 '25 24 26  '$ GLUCOSE 199* 137* 142*  BUN '17 20 20  '$ CREATININE 1.06 0.99 0.97  CALCIUM 8.8* 8.6* 8.6*  MG  --  1.9 2.0  GFRNONAA >60 >60 >60  ANIONGAP '9 10 8    '$ Lipids  Recent Labs  Lab 11/13/22 0505  CHOL 106  TRIG 34  HDL 45  LDLCALC 54  CHOLHDL 2.4    Hematology Recent Labs  Lab 11/11/22 1235 11/12/22 0627 11/13/22 0505  WBC 7.5 6.4 5.9  RBC 3.97* 3.79* 4.17*  HGB 13.0 12.2* 13.4  HCT 39.2 36.9* 40.0  MCV 98.7 97.4 95.9  MCH 32.7 32.2 32.1  MCHC 33.2 33.1 33.5  RDW 13.2 13.2 13.1  PLT 162 149* 170   Thyroid No results for input(s): "TSH", "FREET4" in the last 168 hours.  BNP Recent Labs  Lab 11/11/22 1235  BNP 1,033.4*    DDimer No results for input(s): "DDIMER" in  the last 168 hours.   Radiology    ECHOCARDIOGRAM COMPLETE  Result Date: 11/12/2022    ECHOCARDIOGRAM REPORT   Patient Name:   COSTAS SENA Date of Exam: 11/12/2022 Medical Rec #:  [id number]        Height:       70.0 in Accession #:    [id number]       Weight:       185.0 lb Date of Birth:  06-06-1936        BSA:          2.019 m Patient Age:    87 years         BP:           113/65 mmHg Patient Gender: M                HR:           48 bpm. Exam Location:  [facility] Procedure: 2D Echo, Cardiac Doppler, Color Doppler and Intracardiac            Opacification Agent Indications:     Dyspnea R06.00                  Elevated Troponin                  Chest pain R07.9  History:         Patient has prior history of Echocardiogram examinations, most                  recent 01/30/2021. Risk Factors:Hypertension and Dyslipidemia.                  Persistent Afib.  Sonographer:     Sherrie Sport Referring Phys:  [id number] AMY N COX Diagnosing Phys: Nelva Bush MD  Sonographer Comments: Suboptimal  apical window. IMPRESSIONS  1. Left ventricular ejection fraction, by estimation, is 30 to 35%. The left ventricle has moderately decreased function. The left ventricle demonstrates global hypokinesis. There is moderate asymmetric left ventricular hypertrophy of the basal-septal segment. Left ventricular diastolic parameters are indeterminate.  2. Pulmonary artery pressure is mildly-moderately elevated (RVSP = 35-40 mmHg plus central venous pressure). Right ventricular systolic function is normal. The right ventricular size is normal.  3. Left atrial size was moderately dilated.  4. Right atrial size was mildly dilated.  5. The mitral valve is degenerative. Moderate mitral valve regurgitation.  6. The aortic valve is tricuspid. There is mild thickening of the aortic valve. Aortic valve regurgitation is mild. Aortic valve sclerosis is present, with no evidence of aortic valve stenosis. FINDINGS  Left Ventricle: Left ventricular ejection fraction, by estimation, is 30 to 35%. The left ventricle has moderately decreased function. The left ventricle demonstrates global hypokinesis. Definity contrast agent was given IV to delineate the left ventricular endocardial borders. The left ventricular internal cavity size was normal in size. There is moderate asymmetric left ventricular hypertrophy of the basal-septal segment. Left ventricular diastolic parameters are indeterminate. Right Ventricle: Pulmonary artery pressure is mildly-moderately elevated (RVSP = 35-40 mmHg plus central venous pressure). The right ventricular size is normal. No increase in right ventricular wall thickness. Right ventricular systolic function is normal. Left Atrium: Left atrial size was moderately dilated. Right Atrium: Right atrial size was mildly dilated. Pericardium: The pericardium was not well visualized. Mitral Valve: The mitral valve is degenerative in appearance. There is mild thickening of the mitral valve leaflet(s). Moderate mitral  valve regurgitation. Tricuspid Valve: The tricuspid  valve is normal in structure. Tricuspid valve regurgitation is mild. Aortic Valve: The aortic valve is tricuspid. There is mild thickening of the aortic valve. Aortic valve regurgitation is mild. Aortic regurgitation PHT measures 639 msec. Aortic valve sclerosis is present, with no evidence of aortic valve stenosis. Aortic valve mean gradient measures 3.0 mmHg. Aortic valve peak gradient measures 5.2 mmHg. Aortic valve area, by VTI measures 2.62 cm. Pulmonic Valve: The pulmonic valve was thickened with good excursion. Pulmonic valve regurgitation is mild. No evidence of pulmonic stenosis. Aorta: The aortic root is normal in size and structure. Pulmonary Artery: The pulmonary artery is not well seen. Venous: The inferior vena cava was not well visualized. IAS/Shunts: The interatrial septum was not well visualized.  LEFT VENTRICLE PLAX 2D LVIDd:         5.60 cm LVIDs:         5.10 cm LV PW:         1.10 cm LV IVS:        1.50 cm LVOT diam:     2.20 cm LV SV:         53 LV SV Index:   26 LVOT Area:     3.80 cm  LV Volumes (MOD) LV vol d, MOD A2C: 159.0 ml LV vol d, MOD A4C: 194.0 ml LV vol s, MOD A2C: 113.0 ml LV vol s, MOD A4C: 124.0 ml LV SV MOD A2C:     46.0 ml LV SV MOD A4C:     194.0 ml LV SV MOD BP:      57.0 ml RIGHT VENTRICLE RV Basal diam:  3.70 cm RV Mid diam:    2.80 cm RV S prime:     12.30 cm/s TAPSE (M-mode): 2.4 cm LEFT ATRIUM            Index        RIGHT ATRIUM           Index LA diam:      5.30 cm  2.62 cm/m   RA Area:     18.70 cm LA Vol (A2C): 104.0 ml 51.50 ml/m  RA Volume:   50.50 ml  25.01 ml/m LA Vol (A4C): 58.0 ml  28.72 ml/m  AORTIC VALVE AV Area (Vmax):    2.26 cm AV Area (Vmean):   2.34 cm AV Area (VTI):     2.62 cm AV Vmax:           114.00 cm/s AV Vmean:          75.600 cm/s AV VTI:            0.203 m AV Peak Grad:      5.2 mmHg AV Mean Grad:      3.0 mmHg LVOT Vmax:         67.90 cm/s LVOT Vmean:        46.500 cm/s LVOT VTI:           0.140 m LVOT/AV VTI ratio: 0.69 AI PHT:            639 msec  AORTA Ao Root diam: 3.30 cm MITRAL VALVE               TRICUSPID VALVE MV Area (PHT): 3.61 cm    TR Peak grad:   37.2 mmHg MV Decel Time: 210 msec    TR Vmax:        305.00 cm/s MV E velocity: 82.80 cm/s  SHUNTS                            Systemic VTI:  0.14 m                            Systemic Diam: 2.20 cm Nelva Bush MD Electronically signed by Nelva Bush MD Signature Date/Time: 11/12/2022/4:46:10 PM    Final    DG Chest 2 View  Result Date: 11/11/2022 CLINICAL DATA:  chf EXAM: CHEST - 2 VIEW COMPARISON:  December 12, 2020. FINDINGS: The cardiomediastinal silhouette is unchanged and enlarged in contour.Atherosclerotic calcifications of the tortuous thoracic aorta. Vascular congestion with cephalization of the vasculature. No pleural effusion. No pneumothorax. Visualized abdomen is unremarkable. Mild degenerative changes of the thoracic spine. IMPRESSION: Constellation of findings most consistent with mild pulmonary edema. Electronically Signed   By: Valentino Saxon M.D.   On: 11/11/2022 13:45    Cardiac Studies   Echo 11/12/22 1. Left ventricular ejection fraction, by estimation, is 30 to 35%. The  left ventricle has moderately decreased function. The left ventricle  demonstrates global hypokinesis. There is moderate asymmetric left  ventricular hypertrophy of the basal-septal  segment. Left ventricular diastolic parameters are indeterminate.   2. Pulmonary artery pressure is mildly-moderately elevated (RVSP = 35-40  mmHg plus central venous pressure). Right ventricular systolic function is  normal. The right ventricular size is normal.   3. Left atrial size was moderately dilated.   4. Right atrial size was mildly dilated.   5. The mitral valve is degenerative. Moderate mitral valve regurgitation.   6. The aortic valve is tricuspid. There is mild thickening of the aortic  valve. Aortic valve  regurgitation is mild. Aortic valve sclerosis is  present, with no evidence of aortic valve stenosis.   Echo 01/2021 1. Left ventricular ejection fraction, by estimation, is 55 to 60%. The  left ventricle has normal function. The left ventricle has no regional  wall motion abnormalities. There is mild concentric left ventricular  hypertrophy. Left ventricular diastolic  function could not be evaluated.   2. Right ventricular systolic function is normal. The right ventricular  size is mildly enlarged.   3. Left atrial size was moderately dilated.   4. The mitral valve is normal in structure. Mild mitral valve  regurgitation. No evidence of mitral stenosis.   5. The aortic valve is normal in structure. Aortic valve regurgitation is  mild. No aortic stenosis is present.   6. There is mild dilatation of the aortic root, measuring 39 mm. There is  mild dilatation of the ascending aorta, measuring 43 mm.   7. The inferior vena cava is normal in size with greater than 50%  respiratory variability, suggesting right atrial pressure of 3 mmHg.    Cardiac cath 12/2020  Ost Cx to Dist Cx lesion is 15% stenosed with 20% stenosed side branch in 3rd Mrg. Prox LAD to Mid LAD lesion is 20% stenosed with 25% stenosed side branch in 1st Diag. LV end diastolic pressure is normal.     Angiographically minimal CAD with diffuse calcification but no significant stenosis. LV gram not performed in order to conserve contrast, relatively normal LVEDP.     RECOMMENDATIONS: Return to nursing for ongoing care. We will write to start heparin pending decision about potential pacemaker.  If no PPM, would restart Eliquis tonight. Gentle post-cath hydration     Glenetta Hew, MD  Patient Profile     87 y.o. male essential hypertension, persistent atrial fibrillation who underwent PVI in 02/16/2021, cardiomyopathy, asthma, OSA, type 2 diabetes, hyperlipidemia, who is being seen 11/12/2022 for the evaluation of  shortness of breath and elevated high-sensitivity troponin.  Assessment & Plan    Acute systolic heart failure - presented with worsening dyspnea the last few months - BNP 1033 - IV lasix '40mg'$  daily - Net -1.2L - kidney function stable - Breathing is improving - echo 01/2021 showed LVEF 55-60%, normal RVSF, mild MR, mild AI, mild dilation of the aortic root - echo this admission showed reduced LVEF 30-35%, global HK, moderate asymmetric LVH, mild-moderately elevated RVSP, moderate MI, mild AI - plan for R/L heart cath today - add GDMT as tolerated. NO BB given SB with 1st degree AV block  Elevated HS troponin - HS trop peak 51 - low suspicion for ACS, suspect supply demand mismatch - heart cath 12/2020 showed minimal CAD with diffuse calcification but no significant stenosis - IV heparin - plan as above  Persistent Afib s/p PVI and DCCV - previously with PVI inserted 02/20/21, recurrent afib after the procedure and underwent DCCV 12/2021 - IV heparin - Eliquis held - conitnue Tikosyn 28mg BID - continue telemetry  HTN - PTA lisinopril held - Bps reasonable  HLD - continue pravastatin   For questions or updates, please contact [place]Please consult [email or web address] for contact info under        Signed, Mccayla Shimada HNinfa Meeker PA-C  11/13/2022, 8:28 AM

## 2022-11-13 NOTE — Brief Op Note (Signed)
11/13/2022  3:33 PM  PATIENT:  Edward Mcdaniel  87 y.o. male  PRE-OPERATIVE DIAGNOSIS:  Acute on chronic heart failure with reduced ejection fraction  POST-OPERATIVE DIAGNOSIS:  * No post-op diagnosis entered *  PROCEDURE:  Procedure(s): RIGHT/LEFT HEART CATH AND CORONARY ANGIOGRAPHY (N/A)  SURGEON:  Surgeon(s) and Role:    * Edrian Melucci, MD - Primary  PHYSICIAN ASSISTANT:   FINDINGS: Mild-moderate, non-obstructive CAD. Normal left and right heart filling pressures. Normal Fick CO/CI.  RECOMMENDATIONS: Maintain net even fluid balance. Escalate GDMT as tolerated. Medical therapy to prevent progression of CAD. Restart heparin gtt 2 hours after TR band removal; transition back to apixaban as soon as tomorrow AM if no evidence of bleeding/vascular injury at cath sites.  Nelva Bush, MD [facility]

## 2022-11-13 NOTE — H&P (View-Only) (Signed)
Rounding Note    Patient Name: Edward Mcdaniel Date of Encounter: 11/13/2022  [place] Cardiologist: None   Subjective   Plan for R/L heart cath today. He denies chest pain, still feels short of breath at times.   Inpatient Medications    Scheduled Meds:  dofetilide  250 mcg Oral BID   furosemide  40 mg Intravenous Daily   insulin aspart  0-15 Units Subcutaneous TID WC   insulin aspart  0-5 Units Subcutaneous QHS   pravastatin  40 mg Oral QHS   sodium chloride flush  3 mL Intravenous Q12H   Continuous Infusions:  sodium chloride     sodium chloride Stopped (11/13/22 0558)   heparin 1,350 Units/hr (11/13/22 0700)   PRN Meds: sodium chloride, acetaminophen **OR** acetaminophen, labetalol, nitroGLYCERIN, ondansetron **OR** ondansetron (ZOFRAN) IV, senna-docusate, sodium chloride flush, white petrolatum   Vital Signs    Vitals:   11/12/22 1953 11/13/22 0013 11/13/22 0500 11/13/22 0752  BP: 129/77 123/60 (!) 145/90 130/73  Pulse: (!) 47 (!) 43 60 80  Resp: '18 16 15 16  '$ Temp: 98 F (36.7 C) (!) 97.5 F (36.4 C) 97.8 F (36.6 C) 97.9 F (36.6 C)  TempSrc: Oral  Oral   SpO2: 96% 97% 100% 95%  Weight:      Height:        Intake/Output Summary (Last 24 hours) at 11/13/2022 0828 Last data filed at 11/13/2022 0759 Gross per 24 hour  Intake 509.11 ml  Output 400 ml  Net 109.11 ml      11/11/2022    9:50 PM 09/18/2022    8:13 AM 01/23/2022   11:09 AM  Last 3 Weights  Weight (lbs) 185 lb 186 lb 180 lb  Weight (kg) 83.915 kg 84.369 kg 81.647 kg      Telemetry    NSR, 1st degree AV block Hr 80s - Personally Reviewed  ECG    No new - Personally Reviewed  Physical Exam   GEN: No acute distress.   Neck: No JVD Cardiac: RRR, no murmurs, rubs, or gallops.  Respiratory: Clear to auscultation bilaterally. GI: Soft, nontender, non-distended  MS: No edema; No deformity. Neuro:  Nonfocal  Psych: Normal affect   Labs    High Sensitivity Troponin:    Recent Labs  Lab 11/11/22 1235 11/11/22 1810 11/11/22 2152  TROPONINIHS 43* 50* 51*     Chemistry Recent Labs  Lab 11/11/22 1235 11/12/22 0627 11/13/22 0505  NA 137 137 137  K 3.8 4.0 3.8  CL 103 103 103  CO2 '25 24 26  '$ GLUCOSE 199* 137* 142*  BUN '17 20 20  '$ CREATININE 1.06 0.99 0.97  CALCIUM 8.8* 8.6* 8.6*  MG  --  1.9 2.0  GFRNONAA >60 >60 >60  ANIONGAP '9 10 8    '$ Lipids  Recent Labs  Lab 11/13/22 0505  CHOL 106  TRIG 34  HDL 45  LDLCALC 54  CHOLHDL 2.4    Hematology Recent Labs  Lab 11/11/22 1235 11/12/22 0627 11/13/22 0505  WBC 7.5 6.4 5.9  RBC 3.97* 3.79* 4.17*  HGB 13.0 12.2* 13.4  HCT 39.2 36.9* 40.0  MCV 98.7 97.4 95.9  MCH 32.7 32.2 32.1  MCHC 33.2 33.1 33.5  RDW 13.2 13.2 13.1  PLT 162 149* 170   Thyroid No results for input(s): "TSH", "FREET4" in the last 168 hours.  BNP Recent Labs  Lab 11/11/22 1235  BNP 1,033.4*    DDimer No results for input(s): "DDIMER" in  the last 168 hours.   Radiology    ECHOCARDIOGRAM COMPLETE  Result Date: 11/12/2022    ECHOCARDIOGRAM REPORT   Patient Name:   Edward Mcdaniel Date of Exam: 11/12/2022 Medical Rec #:  [id number]        Height:       70.0 in Accession #:    [id number]       Weight:       185.0 lb Date of Birth:  Sep 03, 1936        BSA:          2.019 m Patient Age:    87 years         BP:           113/65 mmHg Patient Gender: M                HR:           48 bpm. Exam Location:  [facility] Procedure: 2D Echo, Cardiac Doppler, Color Doppler and Intracardiac            Opacification Agent Indications:     Dyspnea R06.00                  Elevated Troponin                  Chest pain R07.9  History:         Patient has prior history of Echocardiogram examinations, most                  recent 01/30/2021. Risk Factors:Hypertension and Dyslipidemia.                  Persistent Afib.  Sonographer:     Sherrie Sport Referring Phys:  [id number] AMY N COX Diagnosing Phys: Nelva Bush MD  Sonographer Comments: Suboptimal  apical window. IMPRESSIONS  1. Left ventricular ejection fraction, by estimation, is 30 to 35%. The left ventricle has moderately decreased function. The left ventricle demonstrates global hypokinesis. There is moderate asymmetric left ventricular hypertrophy of the basal-septal segment. Left ventricular diastolic parameters are indeterminate.  2. Pulmonary artery pressure is mildly-moderately elevated (RVSP = 35-40 mmHg plus central venous pressure). Right ventricular systolic function is normal. The right ventricular size is normal.  3. Left atrial size was moderately dilated.  4. Right atrial size was mildly dilated.  5. The mitral valve is degenerative. Moderate mitral valve regurgitation.  6. The aortic valve is tricuspid. There is mild thickening of the aortic valve. Aortic valve regurgitation is mild. Aortic valve sclerosis is present, with no evidence of aortic valve stenosis. FINDINGS  Left Ventricle: Left ventricular ejection fraction, by estimation, is 30 to 35%. The left ventricle has moderately decreased function. The left ventricle demonstrates global hypokinesis. Definity contrast agent was given IV to delineate the left ventricular endocardial borders. The left ventricular internal cavity size was normal in size. There is moderate asymmetric left ventricular hypertrophy of the basal-septal segment. Left ventricular diastolic parameters are indeterminate. Right Ventricle: Pulmonary artery pressure is mildly-moderately elevated (RVSP = 35-40 mmHg plus central venous pressure). The right ventricular size is normal. No increase in right ventricular wall thickness. Right ventricular systolic function is normal. Left Atrium: Left atrial size was moderately dilated. Right Atrium: Right atrial size was mildly dilated. Pericardium: The pericardium was not well visualized. Mitral Valve: The mitral valve is degenerative in appearance. There is mild thickening of the mitral valve leaflet(s). Moderate mitral  valve regurgitation. Tricuspid Valve: The tricuspid  valve is normal in structure. Tricuspid valve regurgitation is mild. Aortic Valve: The aortic valve is tricuspid. There is mild thickening of the aortic valve. Aortic valve regurgitation is mild. Aortic regurgitation PHT measures 639 msec. Aortic valve sclerosis is present, with no evidence of aortic valve stenosis. Aortic valve mean gradient measures 3.0 mmHg. Aortic valve peak gradient measures 5.2 mmHg. Aortic valve area, by VTI measures 2.62 cm. Pulmonic Valve: The pulmonic valve was thickened with good excursion. Pulmonic valve regurgitation is mild. No evidence of pulmonic stenosis. Aorta: The aortic root is normal in size and structure. Pulmonary Artery: The pulmonary artery is not well seen. Venous: The inferior vena cava was not well visualized. IAS/Shunts: The interatrial septum was not well visualized.  LEFT VENTRICLE PLAX 2D LVIDd:         5.60 cm LVIDs:         5.10 cm LV PW:         1.10 cm LV IVS:        1.50 cm LVOT diam:     2.20 cm LV SV:         53 LV SV Index:   26 LVOT Area:     3.80 cm  LV Volumes (MOD) LV vol d, MOD A2C: 159.0 ml LV vol d, MOD A4C: 194.0 ml LV vol s, MOD A2C: 113.0 ml LV vol s, MOD A4C: 124.0 ml LV SV MOD A2C:     46.0 ml LV SV MOD A4C:     194.0 ml LV SV MOD BP:      57.0 ml RIGHT VENTRICLE RV Basal diam:  3.70 cm RV Mid diam:    2.80 cm RV S prime:     12.30 cm/s TAPSE (M-mode): 2.4 cm LEFT ATRIUM            Index        RIGHT ATRIUM           Index LA diam:      5.30 cm  2.62 cm/m   RA Area:     18.70 cm LA Vol (A2C): 104.0 ml 51.50 ml/m  RA Volume:   50.50 ml  25.01 ml/m LA Vol (A4C): 58.0 ml  28.72 ml/m  AORTIC VALVE AV Area (Vmax):    2.26 cm AV Area (Vmean):   2.34 cm AV Area (VTI):     2.62 cm AV Vmax:           114.00 cm/s AV Vmean:          75.600 cm/s AV VTI:            0.203 m AV Peak Grad:      5.2 mmHg AV Mean Grad:      3.0 mmHg LVOT Vmax:         67.90 cm/s LVOT Vmean:        46.500 cm/s LVOT VTI:           0.140 m LVOT/AV VTI ratio: 0.69 AI PHT:            639 msec  AORTA Ao Root diam: 3.30 cm MITRAL VALVE               TRICUSPID VALVE MV Area (PHT): 3.61 cm    TR Peak grad:   37.2 mmHg MV Decel Time: 210 msec    TR Vmax:        305.00 cm/s MV E velocity: 82.80 cm/s  SHUNTS                            Systemic VTI:  0.14 m                            Systemic Diam: 2.20 cm Nelva Bush MD Electronically signed by Nelva Bush MD Signature Date/Time: 11/12/2022/4:46:10 PM    Final    DG Chest 2 View  Result Date: 11/11/2022 CLINICAL DATA:  chf EXAM: CHEST - 2 VIEW COMPARISON:  December 12, 2020. FINDINGS: The cardiomediastinal silhouette is unchanged and enlarged in contour.Atherosclerotic calcifications of the tortuous thoracic aorta. Vascular congestion with cephalization of the vasculature. No pleural effusion. No pneumothorax. Visualized abdomen is unremarkable. Mild degenerative changes of the thoracic spine. IMPRESSION: Constellation of findings most consistent with mild pulmonary edema. Electronically Signed   By: Valentino Saxon M.D.   On: 11/11/2022 13:45    Cardiac Studies   Echo 11/12/22 1. Left ventricular ejection fraction, by estimation, is 30 to 35%. The  left ventricle has moderately decreased function. The left ventricle  demonstrates global hypokinesis. There is moderate asymmetric left  ventricular hypertrophy of the basal-septal  segment. Left ventricular diastolic parameters are indeterminate.   2. Pulmonary artery pressure is mildly-moderately elevated (RVSP = 35-40  mmHg plus central venous pressure). Right ventricular systolic function is  normal. The right ventricular size is normal.   3. Left atrial size was moderately dilated.   4. Right atrial size was mildly dilated.   5. The mitral valve is degenerative. Moderate mitral valve regurgitation.   6. The aortic valve is tricuspid. There is mild thickening of the aortic  valve. Aortic valve  regurgitation is mild. Aortic valve sclerosis is  present, with no evidence of aortic valve stenosis.   Echo 01/2021 1. Left ventricular ejection fraction, by estimation, is 55 to 60%. The  left ventricle has normal function. The left ventricle has no regional  wall motion abnormalities. There is mild concentric left ventricular  hypertrophy. Left ventricular diastolic  function could not be evaluated.   2. Right ventricular systolic function is normal. The right ventricular  size is mildly enlarged.   3. Left atrial size was moderately dilated.   4. The mitral valve is normal in structure. Mild mitral valve  regurgitation. No evidence of mitral stenosis.   5. The aortic valve is normal in structure. Aortic valve regurgitation is  mild. No aortic stenosis is present.   6. There is mild dilatation of the aortic root, measuring 39 mm. There is  mild dilatation of the ascending aorta, measuring 43 mm.   7. The inferior vena cava is normal in size with greater than 50%  respiratory variability, suggesting right atrial pressure of 3 mmHg.    Cardiac cath 12/2020  Ost Cx to Dist Cx lesion is 15% stenosed with 20% stenosed side branch in 3rd Mrg. Prox LAD to Mid LAD lesion is 20% stenosed with 25% stenosed side branch in 1st Diag. LV end diastolic pressure is normal.     Angiographically minimal CAD with diffuse calcification but no significant stenosis. LV gram not performed in order to conserve contrast, relatively normal LVEDP.     RECOMMENDATIONS: Return to nursing for ongoing care. We will write to start heparin pending decision about potential pacemaker.  If no PPM, would restart Eliquis tonight. Gentle post-cath hydration     Glenetta Hew, MD  Patient Profile     87 y.o. male essential hypertension, persistent atrial fibrillation who underwent PVI in 02/16/2021, cardiomyopathy, asthma, OSA, type 2 diabetes, hyperlipidemia, who is being seen 11/12/2022 for the evaluation of  shortness of breath and elevated high-sensitivity troponin.  Assessment & Plan    Acute systolic heart failure - presented with worsening dyspnea the last few months - BNP 1033 - IV lasix '40mg'$  daily - Net -1.2L - kidney function stable - Breathing is improving - echo 01/2021 showed LVEF 55-60%, normal RVSF, mild MR, mild AI, mild dilation of the aortic root - echo this admission showed reduced LVEF 30-35%, global HK, moderate asymmetric LVH, mild-moderately elevated RVSP, moderate MI, mild AI - plan for R/L heart cath today - add GDMT as tolerated. NO BB given SB with 1st degree AV block  Elevated HS troponin - HS trop peak 51 - low suspicion for ACS, suspect supply demand mismatch - heart cath 12/2020 showed minimal CAD with diffuse calcification but no significant stenosis - IV heparin - plan as above  Persistent Afib s/p PVI and DCCV - previously with PVI inserted 02/20/21, recurrent afib after the procedure and underwent DCCV 12/2021 - IV heparin - Eliquis held - conitnue Tikosyn 266mg BID - continue telemetry  HTN - PTA lisinopril held - Bps reasonable  HLD - continue pravastatin   For questions or updates, please contact [place]Please consult [email or web address] for contact info under        Signed, Nancee Brownrigg HNinfa Meeker PA-C  11/13/2022, 8:28 AM

## 2022-11-13 NOTE — Progress Notes (Signed)
Progress Note   Patient: Edward Mcdaniel VOJ:[id number] DOB: [birth year] DOA: 11/11/2022     2 DOS: the patient was seen and examined on 11/13/2022   Brief hospital course: Mr. Arvin Abello is a 87 year old male with history of CAD, hypertension, atrial fibrillation status post pulmonary vein intervention, non-insulin-dependent diabetes mellitus, hyperlipidemia, who presents emergency department for chief concerns of chest pain over the weekend.  Initial vitals in the ED showed temperature of 98.1, respiration rate of 18, heart rate of 50, blood pressure 123/66, SpO2 of 97% on room air.  Serum sodium is 137, potassium 3.8, chloride 103, bicarb 25, BUN is 17, serum creatinine of 1.06, nonfasting blood glucose 199, eGFR greater than 60, WBC 7.5, hemoglobin 13, platelets of 162.  CXR with pulmonary vascular congestion.  BNP was elevated at 1033.4.  High sensitive troponin was 43.  ED treatment: Furosemide 40 mg IV one-time dose.  Patient admitted for concern of new onset heart failure, cardiology was consulted and he was started on IV diuresis and heparin infusion within.  2/7: Echocardiogram with new diagnosis of HFrEF with a EF of 30 to 35%, global hypokinesis, moderate asymmetric left ventricular hypertrophy of the basal septal ridge segment.  Indeterminate diastolic function.  Elevated pulmonary arterial pressure. Going for right and left cardiac catheterization today. Lipid panel unremarkable.  Labs stable.    Assessment and Plan: * New onset of congestive heart failure ([place]) Patient presented with chest pain and shortness of breath.  Elevated BNP and echocardiogram with low EF and global hypokinesis.  Cardiology was consulted and he will be going for right and left cardiac catheterization today. -Continue with IV Lasix - Strict I's and O's -Daily BMP and weight   Elevated troponin Mildly positive troponin with a flat curve, most likely secondary to demand ischemia. Patient is  currently on heparin infusion. Going for right and left cardiac catheterization for acute HFrEF Cardiology is on board  Diabetes mellitus type 2 in nonobese (HCC) Non-insulin-dependent diabetes mellitus - Metformin 500 mg in the morning not resumed on admission - Insulin SSI with at bedtime coverage ordered - Goal inpatient blood glucose levels 140-180  Essential hypertension Patient was on lisinopril at home, cardiology switched it to losartan. 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  Denies any chest pain or shortness of breath.  Physical Exam: Vitals:   11/13/22 0500 11/13/22 0752 11/13/22 1239 11/13/22 1351  BP: (!) 145/90 130/73 120/69 133/84  Pulse: 60 80 (!) 40 65  Resp: '15 16 16 18  '$ Temp: 97.8 F (36.6 C) 97.9 F (36.6 C) 98.3 F (36.8 C) (!) 94.4 F (34.7 C)  TempSrc: Oral   Axillary  SpO2: 100% 95% 100% 97%  Weight:      Height:       General.  Frail elderly man, in no acute distress. Pulmonary.  Lungs clear bilaterally, normal respiratory effort. CV.  Regular rate and rhythm, no JVD, rub or murmur. Abdomen.  Soft, nontender, nondistended, BS positive. CNS.  Alert and oriented .  No focal neurologic deficit. Extremities.  No edema, no cyanosis, pulses intact and symmetrical. Psychiatry.  Judgment and insight  appears normal.   Data Reviewed: Prior data reviewed  Family Communication:   Disposition: Status is: Inpatient Remains inpatient appropriate because: Severity of illness  Planned Discharge Destination: Home  DVT prophylaxis.  Heparin  Time spent: 50 minutes  This record has been created using Systems analyst. Errors have been sought and corrected,but may not always be located. Such creation errors do not reflect on the standard of care.   Author: Lorella Nimrod, MD 11/13/2022 2:24 PM  For on call review [email or web address].

## 2022-11-13 NOTE — Interval H&P Note (Signed)
History and Physical Interval Note:  11/13/2022 2:28 PM  Edward Mcdaniel  has presented today for surgery, with the diagnosis of acute on chronic heart failure with reduced ejection fraction and chest pain.  The various methods of treatment have been discussed with the patient and family. After consideration of risks, benefits and other options for treatment, the patient has consented to  Procedure(s): RIGHT/LEFT HEART CATH AND CORONARY ANGIOGRAPHY (N/A) as a surgical intervention.  The patient's history has been reviewed, patient examined, no change in status, stable for surgery.  I have reviewed the patient's chart and labs.  Questions were answered to the patient's satisfaction.    Cath Lab Visit (complete for each Cath Lab visit)  Clinical Evaluation Leading to the Procedure:   ACS: Yes.  (Unstable angina and acute on chronic HFrEF)  Non-ACS:  N/A  Harrell Gave Morgana Rowley

## 2022-11-13 NOTE — Consult Note (Signed)
[facility] for IV Heparin Indication: chest pain/ACS and atrial fibrillation  Patient Measurements: Height: '5\' 10"'$  (177.8 cm) Weight: 83.9 kg (185 lb) IBW/kg (Calculated) : 73 Heparin Dosing Weight: 83.9 kg  Labs: Recent Labs    11/11/22 1235 11/11/22 1235 11/11/22 1810 11/11/22 2152 11/12/22 0627 11/12/22 0657 11/12/22 1816 11/13/22 0505  HGB 13.0  --   --   --  12.2*  --   --  13.4  HCT 39.2  --   --   --  36.9*  --   --  40.0  PLT 162  --   --   --  149*  --   --  170  APTT  --    < >  --  36  --  49* 54* 130*  LABPROT  --   --   --  16.8*  --   --   --   --   INR  --   --   --  1.4*  --   --   --   --   HEPARINUNFRC  --   --   --  >1.10*  --  0.96*  --  0.71*  CREATININE 1.06  --   --   --  0.99  --   --  0.97  TROPONINIHS 43*  --  50* 51*  --   --   --   --    < > = values in this interval not displayed.     Estimated Creatinine Clearance: 58.5 mL/min (by C-G formula based on SCr of 0.97 mg/dL).   Medical History: Past Medical History:  Diagnosis Date   Allergic rhinitis    Asthma    DM type 2 (diabetes mellitus, type 2) (HCC)    diet controlled   HTN (hypertension)    Hyperlipidemia    LV dysfunction    Persistent atrial fibrillation (HCC)    Squamous cell carcinoma of skin 10/02/2011   right arm - tx p bx   Squamous cell carcinoma of skin 01/03/2014   in situ on right cheek - tx p bx   Squamous cell carcinoma of skin 07/08/2017   in situ on left hand - tx p bx   Squamous cell carcinoma of skin 03/25/2018   well differentiated on top of left hand - tx p bx   Squamous cell carcinoma of skin 02/15/2020   well differentiated on left zygomatic area cx3, excision    Medications:  Apixaban 5 mg BID prior to admission-Afib, last patient reported dose was 11/11/22 AM  Assessment: 87 y/o M with medical history including Afib on apixaban admitted with new-onset CHF and elevated troponin. Pharmacy consulted to initiate and  manage heparin infusion for ACS. Home apixaban for Afib on hold pending further evaluation.   Baseline aPTT=36, INR=1.4, and heparin level >1.10  Baseline CBC within normal limits.  0206 0657 aPTT 49, HL 0.96; Subthera, 1000 un/hr 0206 1816 aPTT 54; Subthera, 1250 un/hr 0207 0505 aPTT 130, HL 0.71 supratherapeutic  Goal of Therapy:  Heparin level 0.3-0.7 units/ml aPTT 66 - 102 seconds Monitor platelets by anticoagulation protocol: Yes   Plan:  --Decrease heparin infusion rate to 1350 units/hr --Re-check aPTT and HL 8 hours from rate change --Switch over to heparin level monitoring when correlation established given anticipation of interference of apixaban on heparin level assay --Daily CBC per protocol while on IV heparin  Renda Rolls, PharmD, [facility] 11/13/2022 6:10 AM

## 2022-11-14 ENCOUNTER — Other Ambulatory Visit (HOSPITAL_COMMUNITY): Payer: Self-pay

## 2022-11-14 ENCOUNTER — Encounter: Payer: Self-pay | Admitting: Internal Medicine

## 2022-11-14 DIAGNOSIS — I200 Unstable angina: Secondary | ICD-10-CM

## 2022-11-14 DIAGNOSIS — I509 Heart failure, unspecified: Secondary | ICD-10-CM | POA: Diagnosis not present

## 2022-11-14 DIAGNOSIS — I483 Typical atrial flutter: Secondary | ICD-10-CM

## 2022-11-14 LAB — CBC
HCT: 43.4 % (ref 39.0–52.0)
Hemoglobin: 14.7 g/dL (ref 13.0–17.0)
MCH: 32.2 pg (ref 26.0–34.0)
MCHC: 33.9 g/dL (ref 30.0–36.0)
MCV: 95.2 fL (ref 80.0–100.0)
Platelets: 174 10*3/uL (ref 150–400)
RBC: 4.56 MIL/uL (ref 4.22–5.81)
RDW: 13.1 % (ref 11.5–15.5)
WBC: 6.7 10*3/uL (ref 4.0–10.5)
nRBC: 0 % (ref 0.0–0.2)

## 2022-11-14 LAB — POCT I-STAT 7, (LYTES, BLD GAS, ICA,H+H)
Acid-Base Excess: 0 mmol/L (ref 0.0–2.0)
Bicarbonate: 24.3 mmol/L (ref 20.0–28.0)
Calcium, Ion: 1.17 mmol/L (ref 1.15–1.40)
HCT: 43 % (ref 39.0–52.0)
Hemoglobin: 14.6 g/dL (ref 13.0–17.0)
O2 Saturation: 96 %
Potassium: 3.7 mmol/L (ref 3.5–5.1)
Sodium: 139 mmol/L (ref 135–145)
TCO2: 25 mmol/L (ref 22–32)
pCO2 arterial: 37.9 mmHg (ref 32–48)
pH, Arterial: 7.416 (ref 7.35–7.45)
pO2, Arterial: 84 mmHg (ref 83–108)

## 2022-11-14 LAB — POTASSIUM: Potassium: 4 mmol/L (ref 3.5–5.1)

## 2022-11-14 LAB — GLUCOSE, CAPILLARY
Glucose-Capillary: 120 mg/dL — ABNORMAL HIGH (ref 70–99)
Glucose-Capillary: 130 mg/dL — ABNORMAL HIGH (ref 70–99)

## 2022-11-14 LAB — APTT: aPTT: 68 seconds — ABNORMAL HIGH (ref 24–36)

## 2022-11-14 LAB — HEPARIN LEVEL (UNFRACTIONATED): Heparin Unfractionated: 0.4 IU/mL (ref 0.30–0.70)

## 2022-11-14 MED ORDER — EMPAGLIFLOZIN 10 MG PO TABS: 10.0000 mg | ORAL_TABLET | Freq: Every day | ORAL | 1 refills | Status: AC

## 2022-11-14 MED ORDER — SACUBITRIL-VALSARTAN 24-26 MG PO TABS: 1.0000 | ORAL_TABLET | Freq: Two times a day (BID) | ORAL | Status: DC

## 2022-11-14 MED ORDER — SACUBITRIL-VALSARTAN 24-26 MG PO TABS: 1.0000 | ORAL_TABLET | Freq: Two times a day (BID) | ORAL | 1 refills | Status: AC

## 2022-11-14 MED ORDER — FUROSEMIDE 20 MG PO TABS: 20.0000 mg | ORAL_TABLET | Freq: Every day | ORAL | Status: DC

## 2022-11-14 MED ORDER — FUROSEMIDE 40 MG PO TABS: 40.0000 mg | ORAL_TABLET | Freq: Every day | ORAL | 1 refills | Status: AC

## 2022-11-14 MED ORDER — EMPAGLIFLOZIN 10 MG PO TABS: 10.0000 mg | ORAL_TABLET | Freq: Every day | ORAL | Status: DC

## 2022-11-14 MED ORDER — APIXABAN 5 MG PO TABS
5.0000 mg | ORAL_TABLET | Freq: Two times a day (BID) | ORAL | Status: DC
  Administered 2022-11-14: 5 mg via ORAL
  Filled 2022-11-14: qty 1

## 2022-11-14 MED ORDER — FUROSEMIDE 40 MG PO TABS
40.0000 mg | ORAL_TABLET | Freq: Every day | ORAL | Status: DC
  Administered 2022-11-14: 40 mg via ORAL
  Filled 2022-11-14: qty 1

## 2022-11-14 NOTE — Progress Notes (Signed)
Rounding Note    Patient Name: Edward Mcdaniel Date of Encounter: 11/14/2022  [place] Cardiologist: None   Subjective   Heart cath showed mild to moderate nonobstructive CAD.  Patient appears to be in afib/flutter with controlled rates.   Patient is wanting to go home. BMET pending.  Inpatient Medications    Scheduled Meds:  dofetilide  250 mcg Oral BID   [START ON 11/15/2022] empagliflozin  10 mg Oral Daily   insulin aspart  0-5 Units Subcutaneous QHS   insulin aspart  0-9 Units Subcutaneous TID WC   mirtazapine  7.5 mg Oral QHS   pravastatin  40 mg Oral QHS   sacubitril-valsartan  1 tablet Oral BID   sodium chloride flush  3 mL Intravenous Q12H   tamsulosin  0.4 mg Oral QHS   Continuous Infusions:  sodium chloride     heparin 1,350 Units/hr (11/14/22 0511)   PRN Meds: sodium chloride, acetaminophen **OR** acetaminophen, labetalol, lip balm, nitroGLYCERIN, ondansetron **OR** ondansetron (ZOFRAN) IV, senna-docusate, sodium chloride flush, white petrolatum   Vital Signs    Vitals:   11/13/22 2354 11/14/22 0433 11/14/22 0811 11/14/22 0947  BP: 126/75 124/74 135/80   Pulse: (!) 55 66 (!) 43   Resp: '18 18 16   '$ Temp: 97.8 F (36.6 C) 97.8 F (36.6 C) 97.6 F (36.4 C)   TempSrc:  Oral    SpO2: 97% 98% 100%   Weight:    79.2 kg  Height:        Intake/Output Summary (Last 24 hours) at 11/14/2022 1146 Last data filed at 11/14/2022 0511 Gross per 24 hour  Intake 208.02 ml  Output 200 ml  Net 8.02 ml      11/14/2022    9:47 AM 11/11/2022    9:50 PM 09/18/2022    8:13 AM  Last 3 Weights  Weight (lbs) 174 lb 9.6 oz 185 lb 186 lb  Weight (kg) 79.198 kg 83.915 kg 84.369 kg      Telemetry    Afib/flutter Hr 60-70s PVCs - Personally Reviewed  ECG    No new - Personally Reviewed  Physical Exam   GEN: No acute distress.   Neck: No JVD Cardiac: Reg Irreg, no murmurs, rubs, or gallops.  Respiratory: Clear to auscultation bilaterally. GI: Soft,  nontender, non-distended  MS: No edema; No deformity. Neuro:  Nonfocal  Psych: Normal affect   Labs    High Sensitivity Troponin:   Recent Labs  Lab 11/11/22 1235 11/11/22 1810 11/11/22 2152  TROPONINIHS 43* 50* 51*     Chemistry Recent Labs  Lab 11/11/22 1235 11/12/22 0627 11/13/22 0505 11/13/22 1503 11/13/22 1505 11/14/22 0353  NA 137 137 137 140 139  --   K 3.8 4.0 3.8 3.7 3.7 4.0  CL 103 103 103  --   --   --   CO2 '25 24 26  '$ --   --   --   GLUCOSE 199* 137* 142*  --   --   --   BUN '17 20 20  '$ --   --   --   CREATININE 1.06 0.99 0.97  --   --   --   CALCIUM 8.8* 8.6* 8.6*  --   --   --   MG  --  1.9 2.0  --   --   --   GFRNONAA >60 >60 >60  --   --   --   ANIONGAP '9 10 8  '$ --   --   --  Lipids  Recent Labs  Lab 11/13/22 0505  CHOL 106  TRIG 34  HDL 45  LDLCALC 54  CHOLHDL 2.4    Hematology Recent Labs  Lab 11/12/22 0627 11/13/22 0505 11/13/22 1503 11/13/22 1505 11/14/22 0353  WBC 6.4 5.9  --   --  6.7  RBC 3.79* 4.17*  --   --  4.56  HGB 12.2* 13.4 14.6 14.6 14.7  HCT 36.9* 40.0 43.0 43.0 43.4  MCV 97.4 95.9  --   --  95.2  MCH 32.2 32.1  --   --  32.2  MCHC 33.1 33.5  --   --  33.9  RDW 13.2 13.1  --   --  13.1  PLT 149* 170  --   --  174   Thyroid No results for input(s): "TSH", "FREET4" in the last 168 hours.  BNP Recent Labs  Lab 11/11/22 1235  BNP 1,033.4*    DDimer No results for input(s): "DDIMER" in the last 168 hours.   Radiology    CARDIAC CATHETERIZATION  Result Date: 11/14/2022   Colon Flattery Cx to Dist Cx lesion is 15% stenosed with 20% stenosed side branch in 3rd Mrg.   Prox LAD to Mid LAD lesion is 20% stenosed with 35% stenosed side branch in 1st Diag.   LV end diastolic pressure is normal.   There is no aortic valve stenosis. Conclusions: Mild-moderate, non-obstructive coronary artery disease. Normal left and right heart filling pressures. Normal Fick CO/CI.  Recommendations: Maintain net even fluid balance. Escalate GDMT as  tolerated. Medical therapy to prevent progression of CAD. Restart heparin infusion 2 hours after TR band removal; transition back to apixaban as soon as tomorrow morning if no evidence of bleeding/vascular injury at catheterization sites. Nelva Bush, MD [facility]  ECHOCARDIOGRAM COMPLETE  Result Date: 11/12/2022    ECHOCARDIOGRAM REPORT   Patient Name:   [name] Date of Exam: 11/12/2022 Medical Rec #:  [id number]        Height:       70.0 in Accession #:    [id number]       Weight:       185.0 lb Date of Birth:  April 19, 1936        BSA:          2.019 m Patient Age:    87 years         BP:           113/65 mmHg Patient Gender: M                HR:           48 bpm. Exam Location:  [facility] Procedure: 2D Echo, Cardiac Doppler, Color Doppler and Intracardiac            Opacification Agent Indications:     Dyspnea R06.00                  Elevated Troponin                  Chest pain R07.9  History:         Patient has prior history of Echocardiogram examinations, most                  recent 01/30/2021. Risk Factors:Hypertension and Dyslipidemia.                  Persistent Afib.  Sonographer:     Sherrie Sport Referring Phys:  [id number] AMY N  [facility] Diagnosing Phys: Nelva Bush MD  Sonographer Comments: Suboptimal apical window. IMPRESSIONS  1. Left ventricular ejection fraction, by estimation, is 30 to 35%. The left ventricle has moderately decreased function. The left ventricle demonstrates global hypokinesis. There is moderate asymmetric left ventricular hypertrophy of the basal-septal segment. Left ventricular diastolic parameters are indeterminate.  2. Pulmonary artery pressure is mildly-moderately elevated (RVSP = 35-40 mmHg plus central venous pressure). Right ventricular systolic function is normal. The right ventricular size is normal.  3. Left atrial size was moderately dilated.  4. Right atrial size was mildly dilated.  5. The mitral valve is degenerative. Moderate mitral valve regurgitation.  6.  The aortic valve is tricuspid. There is mild thickening of the aortic valve. Aortic valve regurgitation is mild. Aortic valve sclerosis is present, with no evidence of aortic valve stenosis. FINDINGS  Left Ventricle: Left ventricular ejection fraction, by estimation, is 30 to 35%. The left ventricle has moderately decreased function. The left ventricle demonstrates global hypokinesis. Definity contrast agent was given IV to delineate the left ventricular endocardial borders. The left ventricular internal cavity size was normal in size. There is moderate asymmetric left ventricular hypertrophy of the basal-septal segment. Left ventricular diastolic parameters are indeterminate. Right Ventricle: Pulmonary artery pressure is mildly-moderately elevated (RVSP = 35-40 mmHg plus central venous pressure). The right ventricular size is normal. No increase in right ventricular wall thickness. Right ventricular systolic function is normal. Left Atrium: Left atrial size was moderately dilated. Right Atrium: Right atrial size was mildly dilated. Pericardium: The pericardium was not well visualized. Mitral Valve: The mitral valve is degenerative in appearance. There is mild thickening of the mitral valve leaflet(s). Moderate mitral valve regurgitation. Tricuspid Valve: The tricuspid valve is normal in structure. Tricuspid valve regurgitation is mild. Aortic Valve: The aortic valve is tricuspid. There is mild thickening of the aortic valve. Aortic valve regurgitation is mild. Aortic regurgitation PHT measures 639 msec. Aortic valve sclerosis is present, with no evidence of aortic valve stenosis. Aortic valve mean gradient measures 3.0 mmHg. Aortic valve peak gradient measures 5.2 mmHg. Aortic valve area, by VTI measures 2.62 cm. Pulmonic Valve: The pulmonic valve was thickened with good excursion. Pulmonic valve regurgitation is mild. No evidence of pulmonic stenosis. Aorta: The aortic root is normal in size and structure.  Pulmonary Artery: The pulmonary artery is not well seen. Venous: The inferior vena cava was not well visualized. IAS/Shunts: The interatrial septum was not well visualized.  LEFT VENTRICLE PLAX 2D LVIDd:         5.60 cm LVIDs:         5.10 cm LV PW:         1.10 cm LV IVS:        1.50 cm LVOT diam:     2.20 cm LV SV:         53 LV SV Index:   26 LVOT Area:     3.80 cm  LV Volumes (MOD) LV vol d, MOD A2C: 159.0 ml LV vol d, MOD A4C: 194.0 ml LV vol s, MOD A2C: 113.0 ml LV vol s, MOD A4C: 124.0 ml LV SV MOD A2C:     46.0 ml LV SV MOD A4C:     194.0 ml LV SV MOD BP:      57.0 ml RIGHT VENTRICLE RV Basal diam:  3.70 cm RV Mid diam:    2.80 cm RV S prime:     12.30 cm/s TAPSE (M-mode): 2.4 cm LEFT ATRIUM  Index        RIGHT ATRIUM           Index LA diam:      5.30 cm  2.62 cm/m   RA Area:     18.70 cm LA Vol (A2C): 104.0 ml 51.50 ml/m  RA Volume:   50.50 ml  25.01 ml/m LA Vol (A4C): 58.0 ml  28.72 ml/m  AORTIC VALVE AV Area (Vmax):    2.26 cm AV Area (Vmean):   2.34 cm AV Area (VTI):     2.62 cm AV Vmax:           114.00 cm/s AV Vmean:          75.600 cm/s AV VTI:            0.203 m AV Peak Grad:      5.2 mmHg AV Mean Grad:      3.0 mmHg LVOT Vmax:         67.90 cm/s LVOT Vmean:        46.500 cm/s LVOT VTI:          0.140 m LVOT/AV VTI ratio: 0.69 AI PHT:            639 msec  AORTA Ao Root diam: 3.30 cm MITRAL VALVE               TRICUSPID VALVE MV Area (PHT): 3.61 cm    TR Peak grad:   37.2 mmHg MV Decel Time: 210 msec    TR Vmax:        305.00 cm/s MV E velocity: 82.80 cm/s                            SHUNTS                            Systemic VTI:  0.14 m                            Systemic Diam: 2.20 cm Nelva Bush MD Electronically signed by Nelva Bush MD Signature Date/Time: 11/12/2022/4:46:10 PM    Final     Cardiac Studies   R/L Cardiac cath 11/13/22   Ost Cx to Dist Cx lesion is 15% stenosed with 20% stenosed side branch in 3rd Mrg.   Prox LAD to Mid LAD lesion is 20% stenosed with  35% stenosed side branch in 1st Diag.   LV end diastolic pressure is normal.   There is no aortic valve stenosis.   Conclusions: Mild-moderate, non-obstructive coronary artery disease. Normal left and right heart filling pressures. Normal Fick CO/CI.   Recommendations: Maintain net even fluid balance. Escalate GDMT as tolerated. Medical therapy to prevent progression of CAD. Restart heparin infusion 2 hours after TR band removal; transition back to apixaban as soon as tomorrow morning if no evidence of bleeding/vascular injury at catheterization sites.   Nelva Bush, MD [facility]  Echo 11/12/22 1. Left ventricular ejection fraction, by estimation, is 30 to 35%. The  left ventricle has moderately decreased function. The left ventricle  demonstrates global hypokinesis. There is moderate asymmetric left  ventricular hypertrophy of the basal-septal  segment. Left ventricular diastolic parameters are indeterminate.   2. Pulmonary artery pressure is mildly-moderately elevated (RVSP = 35-40  mmHg plus central venous pressure). Right ventricular systolic function is  normal. The right ventricular size is  normal.   3. Left atrial size was moderately dilated.   4. Right atrial size was mildly dilated.   5. The mitral valve is degenerative. Moderate mitral valve regurgitation.   6. The aortic valve is tricuspid. There is mild thickening of the aortic  valve. Aortic valve regurgitation is mild. Aortic valve sclerosis is  present, with no evidence of aortic valve stenosis.    Echo 01/2021 1. Left ventricular ejection fraction, by estimation, is 55 to 60%. The  left ventricle has normal function. The left ventricle has no regional  wall motion abnormalities. There is mild concentric left ventricular  hypertrophy. Left ventricular diastolic  function could not be evaluated.   2. Right ventricular systolic function is normal. The right ventricular  size is mildly enlarged.   3. Left  atrial size was moderately dilated.   4. The mitral valve is normal in structure. Mild mitral valve  regurgitation. No evidence of mitral stenosis.   5. The aortic valve is normal in structure. Aortic valve regurgitation is  mild. No aortic stenosis is present.   6. There is mild dilatation of the aortic root, measuring 39 mm. There is  mild dilatation of the ascending aorta, measuring 43 mm.   7. The inferior vena cava is normal in size with greater than 50%  respiratory variability, suggesting right atrial pressure of 3 mmHg.      Cardiac cath 12/2020  Ost Cx to Dist Cx lesion is 15% stenosed with 20% stenosed side branch in 3rd Mrg. Prox LAD to Mid LAD lesion is 20% stenosed with 25% stenosed side branch in 1st Diag. LV end diastolic pressure is normal.     Angiographically minimal CAD with diffuse calcification but no significant stenosis. LV gram not performed in order to conserve contrast, relatively normal LVEDP.     RECOMMENDATIONS: Return to nursing for ongoing care. We will write to start heparin pending decision about potential pacemaker.  If no PPM, would restart Eliquis tonight. Gentle post-cath hydration     Glenetta Hew, MD  Patient Profile     87 y.o. male with h/o essential hypertension, persistent atrial fibrillation who underwent PVI in 02/16/2021, cardiomyopathy, asthma, OSA, type 2 diabetes, hyperlipidemia, who is being seen 11/12/2022 for the evaluation of shortness of breath and elevated high-sensitivity troponin.   Assessment & Plan    Acute systolic heart failure NICM LVEF 30-35% - presented with worsening dyspnea the last few months - BNP 1033 - IV lasix '40mg'$  daily held - Net -1.9L - kidney function stable - echo 01/2021 showed LVEF 55-60%, normal RVSF, mild MR, mild AI, mild dilation of the aortic root - echo this admission showed reduced LVEF 30-35%, global HK, moderate asymmetric LVH, mild-moderately elevated RVSP, moderate MI, mild AI - heart  cath showed mild to moderate nonobstructive CAD - continue Jardiance '10mg'$  daily - start Entresto 24-'26mg'$ BID - NO BB given SB with 1st degree AV block - PTA lasix '20mg'$  daily   Elevated HS troponin - HS trop peak 51 - low suspicion for ACS, suspect supply demand mismatch - heart cath as above. Cath site is stable - continue medical management - LDL at goal   Persistent Afib s/p PVI and DCCV - previously with PVI inserted 02/20/21, recurrent afib after the procedure and underwent DCCV 12/2021 - IV heparin, can transition back to Eliquis '5mg'$  BID - conitnue Tikosyn 210mg BID - appears to be in afib/flutter with controlled rates. He will have to follow-up with EP as OP.  HTN - PTA lisinopril held - Bps reasonable - cont Jardiance daily and Entresto   HLD - continue pravastatin  For questions or updates, please contact [place] Please consult [email or web address] for contact info under        Signed, Delcia Spitzley Ninfa Meeker, PA-C  11/14/2022, 11:46 AM

## 2022-11-14 NOTE — TOC Benefit Eligibility Note (Addendum)
Patient Teacher, English as a foreign language completed.    The patient is currently admitted and upon discharge could be taking Farxiga 10 mg.  The current 30 day co-pay is $95.00.   The patient is currently admitted and upon discharge could be taking Entresto 24-26 mg.  The current 30 day co-pay is $45.00.   The patient is currently admitted and upon discharge could be taking Jardiance 10 mg.  The current 30 day co-pay is $45.00.   The patient is insured through [employer], [place] Patient Advocate Specialist [place] Patient Advocate Team Direct Number: [phone]  Fax: [phone]

## 2022-11-14 NOTE — Consult Note (Signed)
[place] for IV Heparin Indication: chest pain/ACS and atrial fibrillation  Patient Measurements: Height: '5\' 10"'$  (177.8 cm) Weight: 83.9 kg (185 lb) IBW/kg (Calculated) : 73 Heparin Dosing Weight: 83.9 kg  Labs: Recent Labs    11/11/22 1235 11/11/22 1235 11/11/22 1810 11/11/22 2152 11/12/22 0627 11/12/22 0657 11/12/22 1816 11/13/22 0505 11/13/22 1503 11/14/22 0353  HGB 13.0  --   --   --  12.2*  --   --  13.4 14.6 14.7  HCT 39.2  --   --   --  36.9*  --   --  40.0 43.0 43.4  PLT 162  --   --   --  149*  --   --  170  --  174  APTT  --    < >  --  36  --  49* 54* 130*  --  68*  LABPROT  --   --   --  16.8*  --   --   --   --   --   --   INR  --   --   --  1.4*  --   --   --   --   --   --   HEPARINUNFRC  --    < >  --  >1.10*  --  0.96*  --  0.71*  --  0.40  CREATININE 1.06  --   --   --  0.99  --   --  0.97  --   --   TROPONINIHS 43*  --  50* 51*  --   --   --   --   --   --    < > = values in this interval not displayed.     Estimated Creatinine Clearance: 58.5 mL/min (by C-G formula based on SCr of 0.97 mg/dL).   Medical History: Past Medical History:  Diagnosis Date   Allergic rhinitis    Asthma    DM type 2 (diabetes mellitus, type 2) (HCC)    diet controlled   HTN (hypertension)    Hyperlipidemia    LV dysfunction    Persistent atrial fibrillation (HCC)    Squamous cell carcinoma of skin 10/02/2011   right arm - tx p bx   Squamous cell carcinoma of skin 01/03/2014   in situ on right cheek - tx p bx   Squamous cell carcinoma of skin 07/08/2017   in situ on left hand - tx p bx   Squamous cell carcinoma of skin 03/25/2018   well differentiated on top of left hand - tx p bx   Squamous cell carcinoma of skin 02/15/2020   well differentiated on left zygomatic area cx3, excision    Medications:  Apixaban 5 mg BID prior to admission-Afib, last patient reported dose was 11/11/22 AM  Assessment: 87 y/o M with medical  history including Afib on apixaban admitted with new-onset CHF and elevated troponin. Pharmacy consulted to initiate and manage heparin infusion for ACS. Home apixaban for Afib on hold pending further evaluation.   Baseline aPTT=36, INR=1.4, and heparin level >1.10  Baseline CBC within normal limits.  0206 0657 aPTT 49, HL 0.96; Subthera, 1000 un/hr 0206 1816 aPTT 54; Subthera, 1250 un/hr 0207 0505 aPTT 130, HL 0.71 supratherapeutic 0208 0353 HL 0.40, aPTT 68 therapeutic x 1  Goal of Therapy:  Heparin level 0.3-0.7 units/ml aPTT 66 - 102 seconds Monitor platelets by anticoagulation protocol: Yes   Plan:  --Continue  heparin infusion rate at 1350 units/hr --Re-check HL 8 hours to confirm --Daily CBC per protocol while on IV heparin  Renda Rolls, PharmD, [facility] 11/14/2022 4:54 AM

## 2022-11-14 NOTE — Discharge Summary (Signed)
Physician Discharge Summary  Edward Mcdaniel DXI:[id number] DOB: [birth year] DOA: 11/11/2022  PCP: [facility]  Admit date: 11/11/2022  Discharge date: 11/14/2022  Admitted From: Home. Disposition: Home.  Recommendations for Outpatient Follow-up:  Follow up with PCP in 1-2 weeks. Please obtain BMP/CBC in one week. Advised to follow-up with cardiology as scheduled. Advised to take Ferne Coe and increase lasix to 40 mg daily. Continue Eliquis 5 mg twice daily for A-fib.  Home Health:None Equipment/Devices:None  Discharge Condition: Stable CODE STATUS:Full code Diet recommendation: Heart Healthy   Brief [facility] Course: [name] is a 87 year old male with history of CAD, hypertension, atrial fibrillation status post pulmonary vein intervention, non-insulin-dependent diabetes mellitus, hyperlipidemia, who presents emergency department for chief concerns of chest pain over the weekend. BNP was elevated at 1033.4.  High sensitive troponin was 43.  He was admitted for further evaluation,  Cardiology was consulted.  Patient was started on IV diuresis with IV heparin.Echo with new diagnosis of HFrEF with  LVEF of 30 to 35%, global hypokinesis, moderate asymmetric left ventricular hypertrophy of the basal septal ridge segment. Indeterminate diastolic function.  Elevated pulmonary arterial pressure.  Patient underwent left and right heart catheterization which showed mild to moderate nonobstructive CAD.  Patient continued to remain in atrial flutter and atrial fibrillation with controlled rate.  Cardiology signed off, Patient started on Entresto Jardiance and Lasix dose was increased to 40 mg daily.  Patient is being discharged home.   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   Elevated BNP and echocardiogram with low EF and global hypokinesis.   Cardiology was consulted and  He underwent LHC/ RHC which showed non obstructive CAD. Continued on IV Lasix.  Patient can be discharged on Lasix 40 mg p.o. daily   Elevated troponin Mildly positive troponin with a flat curve, most likely secondary to demand ischemia. Patient completed heparin for 48 hours. He underwent LHC/ RHC which showed non obstructive CAD. Patient is being discharged.  Cardiology signed off.   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 - Per patient is status post biopsy - Counseled patient on appropriate follow-up outpatient - Counseled patient on cessation of tobacco chewing - Petroleum gel as needed   Cardiomyopathy ([place]) - Complete echo ordered  Discharge Instructions  Discharge Instructions     Call MD for:  difficulty breathing, headache or visual disturbances   Complete by: As directed    Call MD for:  persistant dizziness or light-headedness   Complete by: As directed    Call MD for:  persistant nausea and vomiting   Complete by: As directed  Diet - low sodium heart healthy   Complete by: As directed    Diet Carb Modified   Complete by: As directed     Increase activity slowly   Complete by: As directed       Allergies as of 11/14/2022       Reactions   Amlodipine Other (See Comments)   Nightmares   Tape Rash   Eruption, Erythema        Medication List     STOP taking these medications    lisinopril 40 MG tablet Commonly known as: ZESTRIL       TAKE these medications    acetaminophen 500 MG tablet Commonly known as: TYLENOL Take 1,000 mg by mouth every 6 (six) hours as needed for headache or mild pain.   albuterol 108 (90 Base) MCG/ACT inhaler Commonly known as: VENTOLIN HFA Inhale 1-2 puffs into the lungs every 6 (six) hours as needed for wheezing or shortness of breath.   apixaban 5 MG Tabs tablet Commonly known as: ELIQUIS Take 5 mg by mouth 2 (two) times daily.   Carboxymethylcellulose Sod PF 1 % Gel Place 1 drop into both eyes at bedtime.   dofetilide 250 MCG capsule Commonly known as: TIKOSYN Take 1 capsule (250 mcg total) by mouth 2 (two) times daily.   donepezil 5 MG tablet Commonly known as: ARICEPT Take 5 mg by mouth at bedtime.   empagliflozin 10 MG Tabs tablet Commonly known as: JARDIANCE Take 1 tablet (10 mg total) by mouth daily. Start taking on: November 15, 2022   furosemide 40 MG tablet Commonly known as: LASIX Take 1 tablet (40 mg total) by mouth daily. What changed:  medication strength how much to take when to take this reasons to take this   loratadine 10 MG tablet Commonly known as: CLARITIN Take 10 mg by mouth daily as needed.   MENTHOL-METHYL SALICYLATE EX Apply 1 application topically 2 (two) times daily as needed (for joint pain.).   metFORMIN 500 MG 24 hr tablet Commonly known as: GLUCOPHAGE-XR Take 500 mg by mouth in the morning.   mirtazapine 15 MG tablet Commonly known as: REMERON Take 0.5 tablets by mouth at bedtime.   pantoprazole 40 MG tablet Commonly known as: PROTONIX Take 1 tablet (40 mg total) by mouth daily.   pravastatin 40 MG tablet Commonly  known as: PRAVACHOL Take 40 mg by mouth at bedtime.   sacubitril-valsartan 24-26 MG Commonly known as: ENTRESTO Take 1 tablet by mouth 2 (two) times daily.   Systane Balance 0.6 % Soln Generic drug: Propylene Glycol Place 1 drop into both eyes 4 (four) times daily as needed (dry eyes).   tamsulosin 0.4 MG Caps capsule Commonly known as: FLOMAX Take 0.4 mg by mouth at bedtime.        Follow-up Information     [facility] Follow up in 1 week(s).   Contact information: [place] [address] [phone]         Minna Merritts, MD. Go in 1 week(s).   Specialty: Cardiology Why: Appointment on Tuesday, 12/03/2022 at 3:10pm with Edgar. Contact information: [place] [address] [phone]                Allergies  Allergen Reactions   Amlodipine Other (See Comments)    Nightmares   Tape Rash    Eruption, Erythema    Consultations: Cardiology   Procedures/Studies: CARDIAC CATHETERIZATION  Result Date:  11/14/2022   Ost Cx to Dist Cx lesion is 15% stenosed with 20% stenosed side branch in 3rd Mrg.   Prox LAD to Mid LAD lesion is 20% stenosed with 35% stenosed side branch in 1st Diag.   LV end diastolic pressure is normal.   There is no aortic valve stenosis. Conclusions: Mild-moderate, non-obstructive coronary artery disease. Normal left and right heart filling pressures. Normal Fick CO/CI.  Recommendations: Maintain net even fluid balance. Escalate GDMT as tolerated. Medical therapy to prevent progression of CAD. Restart heparin infusion 2 hours after TR band removal; transition back to apixaban as soon as tomorrow morning if no evidence of bleeding/vascular injury at catheterization sites. Nelva Bush, MD [facility]  ECHOCARDIOGRAM COMPLETE  Result Date: 11/12/2022    ECHOCARDIOGRAM REPORT   Patient Name:   Edward Mcdaniel Date of Exam: 11/12/2022 Medical Rec #:   [id number]        Height:       70.0 in Accession #:    [id number]       Weight:       185.0 lb Date of Birth:  09/15/1936        BSA:          2.019 m Patient Age:    87 years         BP:           113/65 mmHg Patient Gender: M                HR:           48 bpm. Exam Location:  [facility] Procedure: 2D Echo, Cardiac Doppler, Color Doppler and Intracardiac            Opacification Agent Indications:     Dyspnea R06.00                  Elevated Troponin                  Chest pain R07.9  History:         Patient has prior history of Echocardiogram examinations, most                  recent 01/30/2021. Risk Factors:Hypertension and Dyslipidemia.                  Persistent Afib.  Sonographer:     Sherrie Sport Referring Phys:  [id number] AMY N COX Diagnosing Phys: Nelva Bush MD  Sonographer Comments: Suboptimal apical window. IMPRESSIONS  1. Left ventricular ejection fraction, by estimation, is 30 to 35%. The left ventricle has moderately decreased function. The left ventricle demonstrates global hypokinesis. There is moderate asymmetric left ventricular hypertrophy of the basal-septal segment. Left ventricular diastolic parameters are indeterminate.  2. Pulmonary artery pressure is mildly-moderately elevated (RVSP = 35-40 mmHg plus central venous pressure). Right ventricular systolic function is normal. The right ventricular size is normal.  3. Left atrial size was moderately dilated.  4. Right atrial size was mildly dilated.  5. The mitral valve is degenerative. Moderate mitral valve regurgitation.  6. The aortic valve is tricuspid. There is mild thickening of the aortic valve. Aortic valve regurgitation is mild. Aortic valve sclerosis is present, with no evidence of aortic valve stenosis. FINDINGS  Left Ventricle: Left ventricular ejection fraction, by estimation, is 30 to 35%. The left ventricle has moderately decreased function. The left ventricle demonstrates global hypokinesis. Definity contrast agent was given IV to  delineate the left ventricular endocardial  borders. The left ventricular internal cavity size was normal in size. There is moderate asymmetric left ventricular hypertrophy of the basal-septal segment. Left ventricular diastolic parameters are indeterminate. Right Ventricle: Pulmonary artery pressure is mildly-moderately elevated (RVSP = 35-40 mmHg plus central venous pressure). The right ventricular size is normal. No increase in right ventricular wall thickness. Right ventricular systolic function is normal. Left Atrium: Left atrial size was moderately dilated. Right Atrium: Right atrial size was mildly dilated. Pericardium: The pericardium was not well visualized. Mitral Valve: The mitral valve is degenerative in appearance. There is mild thickening of the mitral valve leaflet(s). Moderate mitral valve regurgitation. Tricuspid Valve: The tricuspid valve is normal in structure. Tricuspid valve regurgitation is mild. Aortic Valve: The aortic valve is tricuspid. There is mild thickening of the aortic valve. Aortic valve regurgitation is mild. Aortic regurgitation PHT measures 639 msec. Aortic valve sclerosis is present, with no evidence of aortic valve stenosis. Aortic valve mean gradient measures 3.0 mmHg. Aortic valve peak gradient measures 5.2 mmHg. Aortic valve area, by VTI measures 2.62 cm. Pulmonic Valve: The pulmonic valve was thickened with good excursion. Pulmonic valve regurgitation is mild. No evidence of pulmonic stenosis. Aorta: The aortic root is normal in size and structure. Pulmonary Artery: The pulmonary artery is not well seen. Venous: The inferior vena cava was not well visualized. IAS/Shunts: The interatrial septum was not well visualized.  LEFT VENTRICLE PLAX 2D LVIDd:         5.60 cm LVIDs:         5.10 cm LV PW:         1.10 cm LV IVS:        1.50 cm LVOT diam:     2.20 cm LV SV:         53 LV SV Index:   26 LVOT Area:     3.80 cm  LV Volumes (MOD) LV vol d, MOD A2C: 159.0 ml LV vol d, MOD  A4C: 194.0 ml LV vol s, MOD A2C: 113.0 ml LV vol s, MOD A4C: 124.0 ml LV SV MOD A2C:     46.0 ml LV SV MOD A4C:     194.0 ml LV SV MOD BP:      57.0 ml RIGHT VENTRICLE RV Basal diam:  3.70 cm RV Mid diam:    2.80 cm RV S prime:     12.30 cm/s TAPSE (M-mode): 2.4 cm LEFT ATRIUM            Index        RIGHT ATRIUM           Index LA diam:      5.30 cm  2.62 cm/m   RA Area:     18.70 cm LA Vol (A2C): 104.0 ml 51.50 ml/m  RA Volume:   50.50 ml  25.01 ml/m LA Vol (A4C): 58.0 ml  28.72 ml/m  AORTIC VALVE AV Area (Vmax):    2.26 cm AV Area (Vmean):   2.34 cm AV Area (VTI):     2.62 cm AV Vmax:           114.00 cm/s AV Vmean:          75.600 cm/s AV VTI:            0.203 m AV Peak Grad:      5.2 mmHg AV Mean Grad:      3.0 mmHg LVOT Vmax:         67.90 cm/s LVOT Vmean:  46.500 cm/s LVOT VTI:          0.140 m LVOT/AV VTI ratio: 0.69 AI PHT:            639 msec  AORTA Ao Root diam: 3.30 cm MITRAL VALVE               TRICUSPID VALVE MV Area (PHT): 3.61 cm    TR Peak grad:   37.2 mmHg MV Decel Time: 210 msec    TR Vmax:        305.00 cm/s MV E velocity: 82.80 cm/s                            SHUNTS                            Systemic VTI:  0.14 m                            Systemic Diam: 2.20 cm Nelva Bush MD Electronically signed by Nelva Bush MD Signature Date/Time: 11/12/2022/4:46:10 PM    Final    DG Chest 2 View  Result Date: 11/11/2022 CLINICAL DATA:  chf EXAM: CHEST - 2 VIEW COMPARISON:  December 12, 2020. FINDINGS: The cardiomediastinal silhouette is unchanged and enlarged in contour.Atherosclerotic calcifications of the tortuous thoracic aorta. Vascular congestion with cephalization of the vasculature. No pleural effusion. No pneumothorax. Visualized abdomen is unremarkable. Mild degenerative changes of the thoracic spine. IMPRESSION: Constellation of findings most consistent with mild pulmonary edema. Electronically Signed   By: Valentino Saxon M.D.   On: 11/11/2022 13:45     Subjective: Patient was seen and examined at bedside.  Overnight events noted.   Patient reports feeling better and wants to be discharged.  Discharge Exam: Vitals:   11/14/22 0811 11/14/22 1222  BP: 135/80 (!) 136/96  Pulse: (!) 43 (!) 42  Resp: 16 16  Temp: 97.6 F (36.4 C) 97.6 F (36.4 C)  SpO2: 100% 97%   Vitals:   11/14/22 0433 11/14/22 0811 11/14/22 0947 11/14/22 1222  BP: 124/74 135/80  (!) 136/96  Pulse: 66 (!) 43  (!) 42  Resp: '18 16  16  '$ Temp: 97.8 F (36.6 C) 97.6 F (36.4 C)  97.6 F (36.4 C)  TempSrc: Oral     SpO2: 98% 100%  97%  Weight:   79.2 kg   Height:        General: Pt is alert, awake, not in acute distress Cardiovascular: RRR, S1/S2 +, no rubs, no gallops Respiratory: CTA bilaterally, no wheezing, no rhonchi Abdominal: Soft, NT, ND, bowel sounds + Extremities: no edema, no cyanosis    The results of significant diagnostics from this hospitalization (including imaging, microbiology, ancillary and laboratory) are listed below for reference.     Microbiology: No results found for this or any previous visit (from the past 240 hour(s)).   Labs: BNP (last 3 results) Recent Labs    12/26/21 0939 11/11/22 1235  BNP 392.7* 3,532.9*   Basic Metabolic Panel: Recent Labs  Lab 11/11/22 1235 11/12/22 0627 11/13/22 0505 11/13/22 1503 11/13/22 1505 11/14/22 0353  NA 137 137 137 140 139  --   K 3.8 4.0 3.8 3.7 3.7 4.0  CL 103 103 103  --   --   --   CO2 '25 24 26  '$ --   --   --  GLUCOSE 199* 137* 142*  --   --   --   BUN '17 20 20  '$ --   --   --   CREATININE 1.06 0.99 0.97  --   --   --   CALCIUM 8.8* 8.6* 8.6*  --   --   --   MG  --  1.9 2.0  --   --   --    Liver Function Tests: No results for input(s): "AST", "ALT", "ALKPHOS", "BILITOT", "PROT", "ALBUMIN" in the last 168 hours. No results for input(s): "LIPASE", "AMYLASE" in the last 168 hours. No results for input(s): "AMMONIA" in the last 168 hours. CBC: Recent Labs  Lab  11/11/22 1235 11/12/22 0627 11/13/22 0505 11/13/22 1503 11/13/22 1505 11/14/22 0353  WBC 7.5 6.4 5.9  --   --  6.7  HGB 13.0 12.2* 13.4 14.6 14.6 14.7  HCT 39.2 36.9* 40.0 43.0 43.0 43.4  MCV 98.7 97.4 95.9  --   --  95.2  PLT 162 149* 170  --   --  174   Cardiac Enzymes: No results for input(s): "CKTOTAL", "CKMB", "CKMBINDEX", "TROPONINI" in the last 168 hours. BNP: Invalid input(s): "POCBNP" CBG: Recent Labs  Lab 11/13/22 1401 11/13/22 1746 11/13/22 1951 11/14/22 0811 11/14/22 1221  GLUCAP 83 162* 184* 120* 130*   D-Dimer No results for input(s): "DDIMER" in the last 72 hours. Hgb A1c Recent Labs    11/12/22 0627  HGBA1C 7.0*   Lipid Profile Recent Labs    11/13/22 0505  CHOL 106  HDL 45  LDLCALC 54  TRIG 34  CHOLHDL 2.4   Thyroid function studies No results for input(s): "TSH", "T4TOTAL", "T3FREE", "THYROIDAB" in the last 72 hours.  Invalid input(s): "FREET3" Anemia work up No results for input(s): "VITAMINB12", "FOLATE", "FERRITIN", "TIBC", "IRON", "RETICCTPCT" in the last 72 hours. Urinalysis    Component Value Date/Time   COLORURINE YELLOW 10/09/2010 1245   APPEARANCEUR CLOUDY (A) 10/09/2010 1245   LABSPEC 1.010 10/09/2010 1245   PHURINE 7.0 10/09/2010 1245   GLUCOSEU NEGATIVE 10/09/2010 1245   HGBUR NEGATIVE 10/09/2010 1245   BILIRUBINUR NEGATIVE 10/09/2010 1245   KETONESUR NEGATIVE 10/09/2010 1245   PROTEINUR NEGATIVE 10/09/2010 1245   UROBILINOGEN 0.2 10/09/2010 1245   NITRITE NEGATIVE 10/09/2010 1245   LEUKOCYTESUR  10/09/2010 1245    NEGATIVE MICROSCOPIC NOT DONE ON URINES WITH NEGATIVE PROTEIN, BLOOD, LEUKOCYTES, NITRITE, OR GLUCOSE <1000 mg/dL.   Sepsis Labs Recent Labs  Lab 11/11/22 1235 11/12/22 0627 11/13/22 0505 11/14/22 0353  WBC 7.5 6.4 5.9 6.7   Microbiology No results found for this or any previous visit (from the past 240 hour(s)).   Time coordinating discharge: Over 30 minutes  SIGNED:   Shawna Clamp,  MD  [facility] 11/14/2022, 3:33 PM Pager   If 7PM-7AM, please contact night-coverage

## 2022-11-16 LAB — LIPOPROTEIN A (LPA): Lipoprotein (a): 9.1 nmol/L (ref <75.0)

## 2022-11-22 NOTE — Progress Notes (Deleted)
   Patient ID: Edward Mcdaniel, male    DOB: 1935-10-18, 87 y.o.   MRN: [id number]  HPI  Edward Mcdaniel is a 87 y/o male with a history of  Echo 11/12/22 showed an EF of 30-35% along with moderate LVH/ LAE, mild/moderately elevated PA pressure of 35-40 mmHg and moderate Edward.   RHC/LHC 11/13/22:   Ost Cx to Dist Cx lesion is 15% stenosed with 20% stenosed side branch in 3rd Mrg.   Prox LAD to Mid LAD lesion is 20% stenosed with 35% stenosed side branch in 1st Diag.   LV end diastolic pressure is normal.   There is no aortic valve stenosis.  Conclusions: Mild-moderate, non-obstructive coronary artery disease. Normal left and right heart filling pressures. Normal Fick CO/CI.  Admitted 11/11/22 due to chest pain and found to have new onset HF. Diuresed, cathed per above. Remained in aflutter/ afib.   He presents for his initial HF visit with a chief complaint of   Review of Systems    Physical Exam    Assessment & Plan:  1: Chronic heart failure with reduced ejection fraction- - NYHA class  - BNP 11/11/22 was 1033.4  2: HTN- - BP - saw PCP @ [facility] 11/11/22 - BMP 11/13/22 showed sodium 137, potassium 3.8, creatinine 0.97 & GFR >60  3: Atrial fibrillation- - tikoysn - eliquis  - saw EP Edward Mcdaniel) 09/18/22  4: DM- - A1c 11/12/22 was 7.0%  5: CAD- - RHC/LHC done 11/2022 showed nonobstructive CAD - pravastatin

## 2022-11-25 ENCOUNTER — Encounter: Payer: Medicare HMO | Admitting: Family

## 2022-11-27 ENCOUNTER — Other Ambulatory Visit: Payer: Self-pay | Admitting: Cardiology

## 2022-12-03 ENCOUNTER — Ambulatory Visit: Payer: Medicare HMO | Admitting: Cardiology

## 2023-02-26 ENCOUNTER — Ambulatory Visit: Payer: Medicare HMO | Admitting: Cardiology

## 2023-11-01 DIAGNOSIS — R58 Hemorrhage, not elsewhere classified: Secondary | ICD-10-CM | POA: Diagnosis not present

## 2023-11-01 DIAGNOSIS — G4489 Other headache syndrome: Secondary | ICD-10-CM | POA: Diagnosis not present

## 2023-11-02 ENCOUNTER — Emergency Department (HOSPITAL_COMMUNITY)
Admission: EM | Admit: 2023-11-02 | Discharge: 2023-11-02 | Disposition: A | Discharge status: Home / Self Care | Payer: No Typology Code available for payment source | Attending: Emergency Medicine | Admitting: Emergency Medicine

## 2023-11-02 ENCOUNTER — Other Ambulatory Visit: Payer: Self-pay

## 2023-11-02 DIAGNOSIS — Z7901 Long term (current) use of anticoagulants: Secondary | ICD-10-CM | POA: Diagnosis not present

## 2023-11-02 DIAGNOSIS — I110 Hypertensive heart disease with heart failure: Secondary | ICD-10-CM | POA: Insufficient documentation

## 2023-11-02 DIAGNOSIS — Z85828 Personal history of other malignant neoplasm of skin: Secondary | ICD-10-CM | POA: Diagnosis not present

## 2023-11-02 DIAGNOSIS — Z7984 Long term (current) use of oral hypoglycemic drugs: Secondary | ICD-10-CM | POA: Insufficient documentation

## 2023-11-02 DIAGNOSIS — K91841 Postprocedural hemorrhage and hematoma of a digestive system organ or structure following other procedure: Secondary | ICD-10-CM | POA: Diagnosis present

## 2023-11-02 DIAGNOSIS — E119 Type 2 diabetes mellitus without complications: Secondary | ICD-10-CM | POA: Insufficient documentation

## 2023-11-02 DIAGNOSIS — J4520 Mild intermittent asthma, uncomplicated: Secondary | ICD-10-CM | POA: Diagnosis not present

## 2023-11-02 DIAGNOSIS — I5023 Acute on chronic systolic (congestive) heart failure: Secondary | ICD-10-CM | POA: Insufficient documentation

## 2023-11-02 DIAGNOSIS — R58 Hemorrhage, not elsewhere classified: Secondary | ICD-10-CM

## 2023-11-02 LAB — BASIC METABOLIC PANEL
Anion gap: 11 (ref 5–15)
BUN: 22 mg/dL (ref 8–23)
CO2: 25 mmol/L (ref 22–32)
Calcium: 9.2 mg/dL (ref 8.9–10.3)
Chloride: 102 mmol/L (ref 98–111)
Creatinine, Ser: 1.04 mg/dL (ref 0.61–1.24)
GFR, Estimated: >60 mL/min (ref >60)
Glucose, Bld: 111 mg/dL — ABNORMAL HIGH (ref 70–99)
Potassium: 3.9 mmol/L (ref 3.5–5.1)
Sodium: 138 mmol/L (ref 135–145)

## 2023-11-02 LAB — CBC WITH DIFFERENTIAL/PLATELET
Abs Immature Granulocytes: 0.02 10*3/uL (ref 0.00–0.07)
Basophils Absolute: 0.1 10*3/uL (ref 0.0–0.1)
Basophils Relative: 1 %
Eosinophils Absolute: 0.1 10*3/uL (ref 0.0–0.5)
Eosinophils Relative: 2 %
HCT: 43.1 % (ref 39.0–52.0)
Hemoglobin: 14.5 g/dL (ref 13.0–17.0)
Immature Granulocytes: 0 %
Lymphocytes Relative: 23 %
Lymphs Abs: 1.1 10*3/uL (ref 0.7–4.0)
MCH: 33.2 pg (ref 26.0–34.0)
MCHC: 33.6 g/dL (ref 30.0–36.0)
MCV: 98.6 fL (ref 80.0–100.0)
Monocytes Absolute: 0.6 10*3/uL (ref 0.1–1.0)
Monocytes Relative: 12 %
Neutro Abs: 3 10*3/uL (ref 1.7–7.7)
Neutrophils Relative %: 62 %
Platelets: 132 10*3/uL — ABNORMAL LOW (ref 150–400)
RBC: 4.37 MIL/uL (ref 4.22–5.81)
RDW: 13.5 % (ref 11.5–15.5)
WBC: 4.8 10*3/uL (ref 4.0–10.5)
nRBC: 0 % (ref 0.0–0.2)

## 2023-11-02 MED ORDER — TRANEXAMIC ACID FOR EPISTAXIS
500.0000 mg | Freq: Once | TOPICAL | Status: AC
  Administered 2023-11-02: 500 mg via TOPICAL
  Filled 2023-11-02: qty 10

## 2023-11-02 MED ORDER — TRANEXAMIC ACID 1000 MG/10ML IV SOLN
500.0000 mg | Freq: Once | INTRAVENOUS | Status: AC
  Administered 2023-11-02: 500 mg via TOPICAL
  Filled 2023-11-02: qty 10

## 2023-11-02 MED ORDER — LIDOCAINE-EPINEPHRINE (PF) 2 %-1:200000 IJ SOLN
10.0000 mL | Freq: Once | INTRAMUSCULAR | Status: AC
  Administered 2023-11-02: 10 mL via INTRADERMAL
  Filled 2023-11-02: qty 20

## 2023-11-02 NOTE — ED Provider Notes (Signed)
Patient remains hemostatic.  He will hold his Eliquis for the rest of today and call his dentist tomorrow.   Gwyneth Sprout, MD 11/02/23 [phone]

## 2023-11-02 NOTE — ED Triage Notes (Signed)
Pt BIB EMS from home. C/o bleedeing from Tooth extraction site that started when brushing teeth. ext wasTuesday,  Takes eliquis. Reports lightheadedness previously but has resolved.   EMS VS HR 76,136/76, 97% RA

## 2023-11-02 NOTE — ED Notes (Signed)
Pt continues changing guaze soaked with ice water q 30 mins per providers orders, pt notes lite bleeding

## 2023-11-02 NOTE — ED Provider Notes (Signed)
[place] EMERGENCY DEPARTMENT AT [facility] Provider Note  CSN: [id number] Arrival date & time: 11/02/23 0102  Chief Complaint(s) Dental Problem and Coagulation Disorder  HPI Edward Mcdaniel is a 88 y.o. male with a past medical history listed below including atrial fibrillation on Eliquis who had dental extraction on Tuesday (4 days ago) presents for bleeding from extraction site.  Bleeding began around 10 PM.  This was spontaneous. No relief at home with applying pressure prompting his visit.  Reported some lightheadedness which has resolved.  The history is provided by the patient.    Past Medical History Past Medical History:  Diagnosis Date   Allergic rhinitis    Asthma    DM type 2 (diabetes mellitus, type 2) (HCC)    diet controlled   HTN (hypertension)    Hyperlipidemia    LV dysfunction    Persistent atrial fibrillation (HCC)    Squamous cell carcinoma of skin 10/02/2011   right arm - tx p bx   Squamous cell carcinoma of skin 01/03/2014   in situ on right cheek - tx p bx   Squamous cell carcinoma of skin 07/08/2017   in situ on left hand - tx p bx   Squamous cell carcinoma of skin 03/25/2018   well differentiated on top of left hand - tx p bx   Squamous cell carcinoma of skin 02/15/2020   well differentiated on left zygomatic area cx3, excision   Patient Active Problem List   Diagnosis Date Noted   Typical atrial flutter (HCC) 11/14/2022   Acute on chronic HFrEF (heart failure with reduced ejection fraction) (HCC) 11/13/2022   New onset of congestive heart failure (HCC) 11/11/2022   Hyperlipidemia 11/11/2022   Elevated troponin 11/11/2022   Squamous cell carcinoma of skin of lower lip 11/11/2022   Secondary hypercoagulable state (HCC) 06/25/2021   Persistent atrial fibrillation (HCC) 06/25/2021   Atrial fibrillation (HCC) 02/16/2021   Atrial fibrillation with slow ventricular response (HCC) 12/12/2020   Cardiomyopathy (HCC) 12/12/2020    Precordial chest pain 12/12/2020   Chest pain 12/12/2020   Unstable angina (HCC)    OSA (obstructive sleep apnea)    Diabetes mellitus type 2 in nonobese (HCC) 11/03/2007   Essential hypertension 11/03/2007   Seasonal and perennial allergic rhinitis 11/03/2007   Asthma, mild intermittent 11/03/2007   Home Medication(s) Prior to Admission medications   Medication Sig Start Date End Date Taking? Authorizing Provider  acetaminophen (TYLENOL) 500 MG tablet Take 1,000 mg by mouth every 6 (six) hours as needed for headache or mild pain.    [provider]  albuterol (VENTOLIN HFA) 108 (90 Base) MCG/ACT inhaler Inhale 1-2 puffs into the lungs every 6 (six) hours as needed for wheezing or shortness of breath.    [provider]  apixaban (ELIQUIS) 5 MG TABS tablet Take 5 mg by mouth 2 (two) times daily.    [provider]  Carboxymethylcellulose Sod PF 1 % GEL Place 1 drop into both eyes at bedtime.    [provider]  dofetilide (TIKOSYN) 250 MCG capsule Take 1 capsule (250 mcg total) by mouth 2 (two) times daily. 06/28/21   Sheilah Pigeon, PA-C  donepezil (ARICEPT) 5 MG tablet Take 5 mg by mouth at bedtime. 06/21/21   [provider]  empagliflozin (JARDIANCE) 10 MG TABS tablet Take 1 tablet (10 mg total) by mouth daily. 11/15/22   Willeen Niece, MD  furosemide (LASIX) 40 MG tablet Take 1 tablet (40 mg  total) by mouth daily. 11/14/22   Willeen Niece, MD  loratadine (CLARITIN) 10 MG tablet Take 10 mg by mouth daily as needed.    [provider]  MENTHOL-METHYL SALICYLATE EX Apply 1 application topically 2 (two) times daily as needed (for joint pain.).    [provider]  metFORMIN (GLUCOPHAGE-XR) 500 MG 24 hr tablet Take 500 mg by mouth in the morning.    [provider]  mirtazapine (REMERON) 15 MG tablet Take 0.5 tablets by mouth at bedtime. 04/25/21   [provider]  pantoprazole (PROTONIX) 40 MG tablet Take 1  tablet (40 mg total) by mouth daily. 02/18/21   Azalee Course, PA  pravastatin (PRAVACHOL) 40 MG tablet Take 40 mg by mouth at bedtime.    [provider]  Propylene Glycol (SYSTANE BALANCE) 0.6 % SOLN Place 1 drop into both eyes 4 (four) times daily as needed (dry eyes).    [provider]  sacubitril-valsartan (ENTRESTO) 24-26 MG Take 1 tablet by mouth 2 (two) times daily. 11/14/22   Willeen Niece, MD  tamsulosin (FLOMAX) 0.4 MG CAPS capsule Take 0.4 mg by mouth at bedtime.    [provider]                                                                                                                                    Allergies Amlodipine and Tape  Review of Systems Review of Systems As noted in HPI  Physical Exam Vital Signs  I have reviewed the triage vital signs BP 112/89   Pulse 66   Temp (!) 97.5 F (36.4 C)   Resp 18   SpO2 100%   Physical Exam Vitals reviewed.  Constitutional:      General: He is not in acute distress.    Appearance: He is well-developed. He is not diaphoretic.  HENT:     Head: Normocephalic and atraumatic.     Right Ear: External ear normal.     Left Ear: External ear normal.     Nose: Nose normal.     Mouth/Throat:     Mouth: Mucous membranes are moist.   Eyes:     General: No scleral icterus.    Conjunctiva/sclera: Conjunctivae normal.  Neck:     Trachea: Phonation normal.  Cardiovascular:     Rate and Rhythm: Normal rate and regular rhythm.  Pulmonary:     Effort: Pulmonary effort is normal. No respiratory distress.     Breath sounds: No stridor.  Abdominal:     General: There is no distension.  Musculoskeletal:        General: Normal range of motion.     Cervical back: Normal range of motion.  Neurological:     Mental Status: He is alert and oriented to person, place, and time.  Psychiatric:        Behavior: Behavior normal.     ED Results and Treatments Labs (  all labs ordered are listed, but only  abnormal results are displayed) Labs Reviewed  CBC WITH DIFFERENTIAL/PLATELET - Abnormal; Notable for the following components:      Result Value   Platelets 132 (*)    All other components within normal limits  BASIC METABOLIC PANEL - Abnormal; Notable for the following components:   Glucose, Bld 111 (*)    All other components within normal limits                                                                                                                         EKG  EKG Interpretation Date/Time:    Ventricular Rate:    PR Interval:    QRS Duration:    QT Interval:    QTC Calculation:   R Axis:      Text Interpretation:         Radiology No results found.  Medications Ordered in ED Medications  tranexamic acid (CYKLOKAPRON) 1000 MG/10ML topical solution 500 mg (500 mg Topical Given by Other 11/02/23 0200)  tranexamic acid (CYKLOKAPRON) injection 500 mg (500 mg Topical Given by Other 11/02/23 0500)  lidocaine-EPINEPHrine (XYLOCAINE W/EPI) 2 %-1:200000 (PF) injection 10 mL (10 mLs Intradermal Given by Other 11/02/23 0500)   Procedures Procedures  (including critical care time) Medical Decision Making / ED Course   Medical Decision Making Amount and/or Complexity of Data Reviewed Labs: ordered. Decision-making details documented in ED Course.  Risk Prescription drug management.     Bleeding from dental extraction site.  TXA soaked gauze applied. CBC without leukocytosis or anemia. BMP reassuring  On multiple evaluations, patient still had bloodsoaked gauze.  There were points for bleeding subsided however it returned.  I injected TXA around the socket.  And applied ice cold gauze on top.  I had patient change gauze every 30 minutes.  After 2 hours, the bleeding had died down.  Will continue to monitor.  If bleeding stops, patient can be discharged home and contact his dentist tomorrow.  If bleeding recurs, will need to consult dentistry for evaluation and further  management.  Patient care turned over to oncoming provider. Patient case and results discussed in detail; please see their note for further ED managment.       Final Clinical Impression(s) / ED Diagnoses Final diagnoses:  Bleeding    This chart was dictated using voice recognition software.  Despite best efforts to proofread,  errors can occur which can change the documentation meaning.    Nira Conn, MD 11/02/23 [phone]

## 2023-11-02 NOTE — Discharge Instructions (Signed)
You can continue to put some packing back there with cold water the rest of today.  Eat very soft diet and avoid brushing your teeth on that side.  Call your dentist in the morning so that they can recheck it especially if you notice any further bleeding.  Hold your Eliquis tonight and in the morning.  If you have had no further bleeding you can start your Eliquis back tomorrow evening.

## 2023-11-02 NOTE — ED Notes (Signed)
AVS provided to and discussed with patient. Pt verbalizes understanding of discharge instructions and denies any questions or concerns at this time. Pt has ride home. Pt ambulated out of department independently with steady gait.

## 2024-01-06 ENCOUNTER — Telehealth: Payer: Self-pay

## 2024-01-06 NOTE — Telephone Encounter (Signed)
 Received message from the [place] that patient needs a follow up appointment with Dr. Lalla Brothers for Afib to discuss repeat ablation.  Left message for patient to call back to schedule appointment in [place].

## 2024-01-13 NOTE — Progress Notes (Unsigned)
 Electrophysiology Office Follow up Visit Note:    Date:  01/14/2024   ID:  Edward Mcdaniel, DOB 1935/10/21, MRN [id number]  PCP:  Clinic, [facility]  [facility] Cardiologist:  None  [facility] Electrophysiologist:  Lanier Prude, MD    Interval History:     Edward Mcdaniel is a 88 y.o. male who presents for a follow up visit.   I last saw the patient September 18, 2022 for persistent atrial fibrillation.  He had a prior PVI December 17, 2020.  He was put on Tikosyn.  At the time of my last appointment with him he was doing well.  He remained active.  At the time of our last appointment we discussed continuing antiarrhythmic drugs and catheter ablation but opted for medical therapy first.  He is with his family in clinic. He reports fatigue that is severe in the afternoons. He doesn't appreciate his HR. He reports no dizziness, presyncope, syncope. No swelling. Sleeping OK. Weight is stable.           Past medical, surgical, social and family history were reviewed.  ROS:   Please see the history of present illness.    All other systems reviewed and are negative.  EKGs/Labs/Other Studies Reviewed:    The following studies were reviewed today:     EKG Interpretation Date/Time:  Wednesday January 14 2024 08:16:14 EDT Ventricular Rate:  49 PR Interval:    QRS Duration:  126 QT Interval:  500 QTC Calculation: 451 R Axis:   -5  Text Interpretation: Atrial flutter with a slow ventricular response. Confirmed by Steffanie Dunn [id number]) on 01/14/2024 8:22:42 AM    Physical Exam:    VS:  BP (!) 110/52   Pulse (!) 49   Ht 5' 9.5" (1.765 m)   Wt 169 lb 12.8 oz (77 kg)   SpO2 99%   BMI 24.72 kg/m     Wt Readings from Last 3 Encounters:  01/14/24 169 lb 12.8 oz (77 kg)  11/14/22 174 lb 9.6 oz (79.2 kg)  09/18/22 186 lb (84.4 kg)     GEN: no distress CARD: regular rhythm, bradycardic., No MRG RESP: No IWOB. CTAB.      ASSESSMENT:    1.  Persistent atrial fibrillation (HCC)   2. Primary hypertension   3. Encounter for long-term (current) use of high-risk medication    PLAN:    In order of problems listed above:  #Permanent atrial fibrillation and flutter #High risk med monitoring-Tikosyn The patient has symptomatic atrial fibrillation and flutter.  He has previously been managed with Tikosyn after a prior catheter ablation. Has previously stopped amiodarone. He is not in favor of a repeat catheter ablation. I would consider his AF/AFL permanent at this time. Continue Eliquis for stroke prophylaxis  We discussed his slow HR. He is currently wearing a monitor. I have advised him to go ahead and send the monitor back in. We discussed the possibility of a PPM if his HR trend shows significant chronotropic incompetence. I will see him back in about 4 weeks to discuss the monitor results.  He will STOP dofetilide today given permanent AF and lack of impact.   #Hypertension At goal today.  Recommend checking blood pressures 1-2 times per week at home and recording the values.  Recommend bringing these recordings to the primary care physician.   Follow-up 4 weeks with me.  Signed, Steffanie Dunn, MD, [facility], [facility] 01/14/2024 8:23 AM    Electrophysiology [facility]

## 2024-01-14 ENCOUNTER — Ambulatory Visit: Attending: Cardiology | Admitting: Cardiology

## 2024-01-14 ENCOUNTER — Encounter: Payer: Self-pay | Admitting: Cardiology

## 2024-01-14 VITALS — BP 110/52 | HR 49 | Ht 69.5 in | Wt 169.8 lb

## 2024-01-14 DIAGNOSIS — I4819 Other persistent atrial fibrillation: Secondary | ICD-10-CM

## 2024-01-14 DIAGNOSIS — I10 Essential (primary) hypertension: Secondary | ICD-10-CM

## 2024-01-14 DIAGNOSIS — Z79899 Other long term (current) drug therapy: Secondary | ICD-10-CM | POA: Diagnosis not present

## 2024-01-14 NOTE — Patient Instructions (Addendum)
 Medication Instructions:  Your physician has recommended you make the following change in your medication:  1) STOP taking Tikosyn  *If you need a refill on your cardiac medications before your next appointment, please call your pharmacy*  Follow-Up: At [facility], you and your health needs are our priority.  As part of our continuing mission to provide you with exceptional heart care, we have created designated Provider Care Teams.  These Care Teams include your primary Cardiologist (physician) and Advanced Practice Providers (APPs -  Physician Assistants and Nurse Practitioners) who all work together to provide you with the care you need, when you need it.   Your next appointment:   4 weeks with Dr. Lalla Brothers

## 2024-02-10 NOTE — Progress Notes (Unsigned)
  Electrophysiology Office Follow up Visit Note:    Date:  02/11/2024   ID:  Edward Mcdaniel, DOB 1935-11-16, MRN [id number]  PCP:  Clinic, Nada Auer  [facility] Cardiologist:  None  [facility] Electrophysiologist:  Boyce Byes, MD    Interval History:     Edward Mcdaniel is a 88 y.o. male who presents for a follow up visit.   I last saw the patient January 14, 2024 for his persistent atrial fibrillation.  He was previously on amiodarone and Tikosyn  but these became ineffective and he now has permanent atrial fibrillation and flutter.  At the time of our last appointment he was wearing a heart monitor.  He was to return that monitor so that we could review his heart rate trends to determine whether or not permanent pacing was indicated.        Past medical, surgical, social and family history were reviewed.  ROS:   Please see the history of present illness.    All other systems reviewed and are negative.  EKGs/Labs/Other Studies Reviewed:    The following studies were reviewed today:  January 14, 2024 EKG shows atrial flutter with a ventricular rate of 49 bpm.  January 27, 2024 Holter monitor in the [place] system 100% atrial fibrillation, average heart rate 60 bpm 47 pauses, longest 5 seconds, all nocturnal Frequent PVCs, 9.2% Reported NSVT episodes are aberrantly conducted atrial fibrillation       Physical Exam:    VS:  BP (!) 152/90 (BP Location: Left Arm, Patient Position: Sitting, Cuff Size: Normal)   Pulse 60   Ht 5' 9.5" (1.765 m)   Wt 163 lb (73.9 kg)   SpO2 98%   BMI 23.73 kg/m     Wt Readings from Last 3 Encounters:  02/11/24 163 lb (73.9 kg)  01/14/24 169 lb 12.8 oz (77 kg)  11/14/22 174 lb 9.6 oz (79.2 kg)     GEN: no distress CARD: Regular rhythm, No MRG RESP: No IWOB. CTAB.      ASSESSMENT:    1. Permanent atrial fibrillation (HCC)   2. Primary hypertension    PLAN:    In order of problems listed above:  #Permanent atrial  fibrillation and flutter On Eliquis  for stroke prophylaxis  #Hypertension Above goal today but reports it was at goal this morning at home..  Recommend checking blood pressures 1-2 times per week at home and recording the values.  Recommend bringing these recordings to the primary care physician.  Follow-up with EP on an as needed basis    Signed, Harvie Liner, MD, [facility], [facility] 02/11/2024 9:23 AM    Electrophysiology [facility]

## 2024-02-11 ENCOUNTER — Ambulatory Visit: Attending: Cardiology | Admitting: Cardiology

## 2024-02-11 ENCOUNTER — Encounter: Payer: Self-pay | Admitting: Cardiology

## 2024-02-11 VITALS — BP 152/90 | HR 60 | Ht 69.5 in | Wt 163.0 lb

## 2024-02-11 DIAGNOSIS — I10 Essential (primary) hypertension: Secondary | ICD-10-CM | POA: Diagnosis not present

## 2024-02-11 DIAGNOSIS — I4821 Permanent atrial fibrillation: Secondary | ICD-10-CM | POA: Diagnosis not present

## 2024-02-11 NOTE — Patient Instructions (Signed)
